# Patient Record
Sex: Female | Born: 1970 | Hispanic: Yes | Marital: Single | Smoking: Never smoker
Health system: Southern US, Community
[De-identification: ages and names within clinical notes are randomized; demographics above are authoritative.]

## PROBLEM LIST (undated history)

## (undated) DIAGNOSIS — F419 Anxiety disorder, unspecified: Secondary | ICD-10-CM

## (undated) DIAGNOSIS — F32A Depression, unspecified: Secondary | ICD-10-CM

## (undated) DIAGNOSIS — G4730 Sleep apnea, unspecified: Secondary | ICD-10-CM

## (undated) DIAGNOSIS — Z86718 Personal history of other venous thrombosis and embolism: Secondary | ICD-10-CM

## (undated) DIAGNOSIS — Z9109 Other allergy status, other than to drugs and biological substances: Secondary | ICD-10-CM

## (undated) DIAGNOSIS — O24419 Gestational diabetes mellitus in pregnancy, unspecified control: Secondary | ICD-10-CM

## (undated) HISTORY — PX: WISDOM TOOTH EXTRACTION: SHX21

## (undated) HISTORY — PX: NO PAST SURGERIES: SHX2092

## (undated) HISTORY — PX: OOPHORECTOMY: SHX86

## (undated) HISTORY — PX: ABDOMINAL HYSTERECTOMY: SHX81

---

## 2000-01-24 ENCOUNTER — Emergency Department (HOSPITAL_COMMUNITY): Admission: EM | Admit: 2000-01-24 | Discharge: 2000-01-24 | Payer: Self-pay | Admitting: Emergency Medicine

## 2000-02-01 ENCOUNTER — Emergency Department (HOSPITAL_COMMUNITY): Admission: EM | Admit: 2000-02-01 | Discharge: 2000-02-01 | Payer: Self-pay | Admitting: Emergency Medicine

## 2000-08-24 ENCOUNTER — Inpatient Hospital Stay (HOSPITAL_COMMUNITY): Admission: AD | Admit: 2000-08-24 | Discharge: 2000-08-24 | Payer: Self-pay | Admitting: *Deleted

## 2000-08-25 ENCOUNTER — Encounter: Payer: Self-pay | Admitting: *Deleted

## 2000-08-25 ENCOUNTER — Inpatient Hospital Stay (HOSPITAL_COMMUNITY): Admission: AD | Admit: 2000-08-25 | Discharge: 2000-08-25 | Payer: Self-pay | Admitting: *Deleted

## 2000-08-31 ENCOUNTER — Encounter: Payer: Self-pay | Admitting: *Deleted

## 2000-08-31 ENCOUNTER — Inpatient Hospital Stay (HOSPITAL_COMMUNITY): Admission: AD | Admit: 2000-08-31 | Discharge: 2000-08-31 | Payer: Self-pay | Admitting: *Deleted

## 2000-09-03 ENCOUNTER — Inpatient Hospital Stay (HOSPITAL_COMMUNITY): Admission: AD | Admit: 2000-09-03 | Discharge: 2000-09-03 | Payer: Self-pay | Admitting: Obstetrics & Gynecology

## 2000-09-10 ENCOUNTER — Inpatient Hospital Stay (HOSPITAL_COMMUNITY): Admission: AD | Admit: 2000-09-10 | Discharge: 2000-09-10 | Payer: Self-pay | Admitting: Obstetrics & Gynecology

## 2000-11-02 ENCOUNTER — Emergency Department (HOSPITAL_COMMUNITY): Admission: EM | Admit: 2000-11-02 | Discharge: 2000-11-03 | Payer: Self-pay | Admitting: Emergency Medicine

## 2000-11-03 ENCOUNTER — Encounter: Payer: Self-pay | Admitting: Emergency Medicine

## 2000-12-28 ENCOUNTER — Emergency Department (HOSPITAL_COMMUNITY): Admission: EM | Admit: 2000-12-28 | Discharge: 2000-12-28 | Payer: Self-pay | Admitting: Emergency Medicine

## 2001-02-21 ENCOUNTER — Emergency Department (HOSPITAL_COMMUNITY): Admission: EM | Admit: 2001-02-21 | Discharge: 2001-02-21 | Payer: Self-pay | Admitting: Emergency Medicine

## 2001-04-01 ENCOUNTER — Encounter: Payer: Self-pay | Admitting: Obstetrics & Gynecology

## 2001-04-01 ENCOUNTER — Inpatient Hospital Stay: Admission: AD | Admit: 2001-04-01 | Discharge: 2001-04-01 | Payer: Self-pay | Admitting: *Deleted

## 2001-04-03 ENCOUNTER — Inpatient Hospital Stay (HOSPITAL_COMMUNITY): Admission: AD | Admit: 2001-04-03 | Discharge: 2001-04-03 | Payer: Self-pay | Admitting: *Deleted

## 2001-04-09 ENCOUNTER — Inpatient Hospital Stay (HOSPITAL_COMMUNITY): Admission: AD | Admit: 2001-04-09 | Discharge: 2001-04-09 | Payer: Self-pay | Admitting: *Deleted

## 2001-04-09 ENCOUNTER — Encounter: Payer: Self-pay | Admitting: *Deleted

## 2001-04-19 ENCOUNTER — Encounter (INDEPENDENT_AMBULATORY_CARE_PROVIDER_SITE_OTHER): Payer: Self-pay | Admitting: Specialist

## 2001-04-19 ENCOUNTER — Ambulatory Visit (HOSPITAL_COMMUNITY): Admission: RE | Admit: 2001-04-19 | Discharge: 2001-04-19 | Payer: Self-pay | Admitting: Obstetrics & Gynecology

## 2001-04-19 ENCOUNTER — Encounter: Payer: Self-pay | Admitting: *Deleted

## 2001-04-26 ENCOUNTER — Inpatient Hospital Stay (HOSPITAL_COMMUNITY): Admission: AD | Admit: 2001-04-26 | Discharge: 2001-04-26 | Payer: Self-pay | Admitting: Obstetrics

## 2001-04-26 ENCOUNTER — Encounter: Payer: Self-pay | Admitting: Obstetrics

## 2001-05-18 ENCOUNTER — Ambulatory Visit (HOSPITAL_COMMUNITY): Admission: RE | Admit: 2001-05-18 | Discharge: 2001-05-18 | Payer: Self-pay | Admitting: *Deleted

## 2002-01-03 ENCOUNTER — Emergency Department (HOSPITAL_COMMUNITY): Admission: EM | Admit: 2002-01-03 | Discharge: 2002-01-03 | Payer: Self-pay | Admitting: Emergency Medicine

## 2002-07-26 ENCOUNTER — Encounter (HOSPITAL_COMMUNITY): Admission: RE | Admit: 2002-07-26 | Discharge: 2002-08-25 | Payer: Self-pay | Admitting: *Deleted

## 2002-07-26 ENCOUNTER — Encounter (INDEPENDENT_AMBULATORY_CARE_PROVIDER_SITE_OTHER): Payer: Self-pay | Admitting: Specialist

## 2002-08-02 ENCOUNTER — Encounter: Payer: Self-pay | Admitting: *Deleted

## 2002-08-24 ENCOUNTER — Inpatient Hospital Stay (HOSPITAL_COMMUNITY): Admission: AD | Admit: 2002-08-24 | Discharge: 2002-08-24 | Payer: Self-pay | Admitting: Obstetrics and Gynecology

## 2002-08-30 ENCOUNTER — Encounter (HOSPITAL_COMMUNITY): Admission: RE | Admit: 2002-08-30 | Discharge: 2002-09-29 | Payer: Self-pay | Admitting: *Deleted

## 2002-10-18 ENCOUNTER — Encounter (HOSPITAL_COMMUNITY): Admission: RE | Admit: 2002-10-18 | Discharge: 2002-11-17 | Payer: Self-pay | Admitting: *Deleted

## 2002-10-18 ENCOUNTER — Encounter: Payer: Self-pay | Admitting: *Deleted

## 2002-11-29 ENCOUNTER — Encounter: Admission: RE | Admit: 2002-11-29 | Discharge: 2002-12-29 | Payer: Self-pay | Admitting: *Deleted

## 2002-12-27 ENCOUNTER — Encounter: Payer: Self-pay | Admitting: *Deleted

## 2002-12-30 ENCOUNTER — Inpatient Hospital Stay (HOSPITAL_COMMUNITY): Admission: AD | Admit: 2002-12-30 | Discharge: 2003-01-03 | Payer: Self-pay | Admitting: Obstetrics and Gynecology

## 2003-01-10 ENCOUNTER — Encounter (HOSPITAL_COMMUNITY): Admission: RE | Admit: 2003-01-10 | Discharge: 2003-01-10 | Payer: Self-pay | Admitting: *Deleted

## 2003-01-17 ENCOUNTER — Encounter: Admission: RE | Admit: 2003-01-17 | Discharge: 2003-04-17 | Payer: Self-pay | Admitting: *Deleted

## 2003-01-17 ENCOUNTER — Encounter (HOSPITAL_COMMUNITY): Admission: RE | Admit: 2003-01-17 | Discharge: 2003-01-17 | Payer: Self-pay | Admitting: *Deleted

## 2003-01-25 ENCOUNTER — Encounter: Admission: RE | Admit: 2003-01-25 | Discharge: 2003-01-25 | Payer: Self-pay | Admitting: *Deleted

## 2003-01-25 ENCOUNTER — Encounter (HOSPITAL_COMMUNITY): Admission: RE | Admit: 2003-01-25 | Discharge: 2003-02-24 | Payer: Self-pay | Admitting: *Deleted

## 2003-02-01 ENCOUNTER — Encounter: Admission: RE | Admit: 2003-02-01 | Discharge: 2003-02-01 | Payer: Self-pay | Admitting: *Deleted

## 2003-02-09 ENCOUNTER — Encounter: Admission: RE | Admit: 2003-02-09 | Discharge: 2003-02-09 | Payer: Self-pay | Admitting: *Deleted

## 2003-02-16 ENCOUNTER — Encounter: Admission: RE | Admit: 2003-02-16 | Discharge: 2003-02-16 | Payer: Self-pay | Admitting: *Deleted

## 2003-02-23 ENCOUNTER — Encounter: Admission: RE | Admit: 2003-02-23 | Discharge: 2003-02-23 | Payer: Self-pay | Admitting: *Deleted

## 2003-02-27 ENCOUNTER — Encounter (HOSPITAL_COMMUNITY): Admission: RE | Admit: 2003-02-27 | Discharge: 2003-03-09 | Payer: Self-pay | Admitting: *Deleted

## 2003-03-02 ENCOUNTER — Encounter: Admission: RE | Admit: 2003-03-02 | Discharge: 2003-03-02 | Payer: Self-pay | Admitting: *Deleted

## 2003-03-09 ENCOUNTER — Encounter: Admission: RE | Admit: 2003-03-09 | Discharge: 2003-03-09 | Payer: Self-pay | Admitting: *Deleted

## 2003-03-13 ENCOUNTER — Inpatient Hospital Stay (HOSPITAL_COMMUNITY): Admission: AD | Admit: 2003-03-13 | Discharge: 2003-03-16 | Payer: Self-pay | Admitting: *Deleted

## 2003-03-14 ENCOUNTER — Encounter (INDEPENDENT_AMBULATORY_CARE_PROVIDER_SITE_OTHER): Payer: Self-pay

## 2003-03-24 ENCOUNTER — Inpatient Hospital Stay (HOSPITAL_COMMUNITY): Admission: AD | Admit: 2003-03-24 | Discharge: 2003-03-24 | Payer: Self-pay | Admitting: *Deleted

## 2003-04-25 ENCOUNTER — Other Ambulatory Visit: Admission: RE | Admit: 2003-04-25 | Discharge: 2003-04-25 | Payer: Self-pay | Admitting: Obstetrics and Gynecology

## 2003-04-25 ENCOUNTER — Encounter: Admission: RE | Admit: 2003-04-25 | Discharge: 2003-04-25 | Payer: Self-pay | Admitting: Obstetrics and Gynecology

## 2003-06-05 ENCOUNTER — Emergency Department (HOSPITAL_COMMUNITY): Admission: EM | Admit: 2003-06-05 | Discharge: 2003-06-05 | Payer: Self-pay | Admitting: Emergency Medicine

## 2005-01-03 ENCOUNTER — Inpatient Hospital Stay (HOSPITAL_COMMUNITY): Admission: AD | Admit: 2005-01-03 | Discharge: 2005-01-03 | Payer: Self-pay | Admitting: Obstetrics & Gynecology

## 2005-01-08 ENCOUNTER — Inpatient Hospital Stay (HOSPITAL_COMMUNITY): Admission: AD | Admit: 2005-01-08 | Discharge: 2005-01-08 | Payer: Self-pay | Admitting: Obstetrics and Gynecology

## 2005-01-16 ENCOUNTER — Ambulatory Visit: Payer: Self-pay | Admitting: Family Medicine

## 2005-01-24 ENCOUNTER — Ambulatory Visit (HOSPITAL_COMMUNITY): Admission: RE | Admit: 2005-01-24 | Discharge: 2005-01-24 | Payer: Self-pay | Admitting: *Deleted

## 2005-01-27 ENCOUNTER — Inpatient Hospital Stay (HOSPITAL_COMMUNITY): Admission: AD | Admit: 2005-01-27 | Discharge: 2005-01-30 | Payer: Self-pay | Admitting: *Deleted

## 2005-01-27 ENCOUNTER — Ambulatory Visit: Payer: Self-pay | Admitting: Family Medicine

## 2005-02-05 ENCOUNTER — Ambulatory Visit: Payer: Self-pay | Admitting: *Deleted

## 2005-02-12 ENCOUNTER — Ambulatory Visit: Payer: Self-pay | Admitting: *Deleted

## 2005-02-26 ENCOUNTER — Ambulatory Visit: Payer: Self-pay | Admitting: Family Medicine

## 2005-02-26 ENCOUNTER — Ambulatory Visit: Payer: Self-pay | Admitting: *Deleted

## 2005-02-26 ENCOUNTER — Inpatient Hospital Stay (HOSPITAL_COMMUNITY): Admission: AD | Admit: 2005-02-26 | Discharge: 2005-02-28 | Payer: Self-pay | Admitting: Obstetrics and Gynecology

## 2005-03-05 ENCOUNTER — Ambulatory Visit: Payer: Self-pay | Admitting: *Deleted

## 2005-03-19 ENCOUNTER — Ambulatory Visit: Payer: Self-pay | Admitting: *Deleted

## 2005-03-19 ENCOUNTER — Encounter (INDEPENDENT_AMBULATORY_CARE_PROVIDER_SITE_OTHER): Payer: Self-pay | Admitting: *Deleted

## 2005-04-02 ENCOUNTER — Ambulatory Visit: Payer: Self-pay | Admitting: *Deleted

## 2005-04-09 ENCOUNTER — Ambulatory Visit: Payer: Self-pay | Admitting: *Deleted

## 2005-04-15 ENCOUNTER — Ambulatory Visit: Payer: Self-pay | Admitting: *Deleted

## 2005-04-15 ENCOUNTER — Ambulatory Visit (HOSPITAL_COMMUNITY): Admission: RE | Admit: 2005-04-15 | Discharge: 2005-04-15 | Payer: Self-pay | Admitting: Obstetrics and Gynecology

## 2005-04-30 ENCOUNTER — Ambulatory Visit: Payer: Self-pay | Admitting: *Deleted

## 2005-05-14 ENCOUNTER — Ambulatory Visit: Payer: Self-pay | Admitting: *Deleted

## 2005-05-28 ENCOUNTER — Ambulatory Visit: Payer: Self-pay | Admitting: *Deleted

## 2005-06-11 ENCOUNTER — Ambulatory Visit: Payer: Self-pay | Admitting: *Deleted

## 2005-07-02 ENCOUNTER — Ambulatory Visit (HOSPITAL_COMMUNITY): Admission: RE | Admit: 2005-07-02 | Discharge: 2005-07-02 | Payer: Self-pay | Admitting: Obstetrics and Gynecology

## 2005-07-02 ENCOUNTER — Ambulatory Visit: Payer: Self-pay | Admitting: *Deleted

## 2005-07-16 ENCOUNTER — Ambulatory Visit: Payer: Self-pay | Admitting: *Deleted

## 2005-07-30 ENCOUNTER — Ambulatory Visit: Payer: Self-pay | Admitting: *Deleted

## 2005-08-06 ENCOUNTER — Ambulatory Visit: Payer: Self-pay | Admitting: Obstetrics & Gynecology

## 2005-08-13 ENCOUNTER — Ambulatory Visit: Payer: Self-pay | Admitting: Obstetrics & Gynecology

## 2005-08-20 ENCOUNTER — Ambulatory Visit: Payer: Self-pay | Admitting: *Deleted

## 2005-08-20 ENCOUNTER — Inpatient Hospital Stay (HOSPITAL_COMMUNITY): Admission: AD | Admit: 2005-08-20 | Discharge: 2005-08-20 | Payer: Self-pay | Admitting: Obstetrics and Gynecology

## 2005-08-22 ENCOUNTER — Ambulatory Visit: Payer: Self-pay | Admitting: *Deleted

## 2005-08-27 ENCOUNTER — Ambulatory Visit: Payer: Self-pay | Admitting: Obstetrics & Gynecology

## 2005-09-03 ENCOUNTER — Ambulatory Visit: Payer: Self-pay | Admitting: Obstetrics and Gynecology

## 2005-09-04 ENCOUNTER — Ambulatory Visit: Payer: Self-pay | Admitting: *Deleted

## 2005-09-04 ENCOUNTER — Inpatient Hospital Stay (HOSPITAL_COMMUNITY): Admission: RE | Admit: 2005-09-04 | Discharge: 2005-09-07 | Payer: Self-pay | Admitting: *Deleted

## 2005-09-04 ENCOUNTER — Encounter (INDEPENDENT_AMBULATORY_CARE_PROVIDER_SITE_OTHER): Payer: Self-pay | Admitting: *Deleted

## 2005-09-11 ENCOUNTER — Ambulatory Visit: Payer: Self-pay | Admitting: Obstetrics & Gynecology

## 2006-05-08 ENCOUNTER — Ambulatory Visit: Payer: Self-pay | Admitting: Internal Medicine

## 2006-06-19 ENCOUNTER — Ambulatory Visit: Payer: Self-pay | Admitting: Surgery

## 2006-07-02 ENCOUNTER — Ambulatory Visit: Payer: Self-pay | Admitting: Gynecology

## 2006-08-10 ENCOUNTER — Emergency Department: Payer: Self-pay | Admitting: Emergency Medicine

## 2006-08-14 ENCOUNTER — Encounter (INDEPENDENT_AMBULATORY_CARE_PROVIDER_SITE_OTHER): Payer: Self-pay | Admitting: Specialist

## 2006-08-14 ENCOUNTER — Ambulatory Visit: Payer: Self-pay | Admitting: Gynecology

## 2006-08-14 ENCOUNTER — Ambulatory Visit (HOSPITAL_COMMUNITY): Admission: RE | Admit: 2006-08-14 | Discharge: 2006-08-14 | Payer: Self-pay | Admitting: Gynecology

## 2006-08-18 IMAGING — CT CT STONE STUDY
1 of 2 series · 15 of 32 positions shown, 19 images · non-contrast
Comparison: none

REASON FOR EXAM: Hematuria, evaluate for kidney stones
COMMENTS:

PROCEDURE:     CT  - CT ABDOMEN /PELVIS WO (STONE)  - May 08, 2006  [DATE]
RESULT:
HISTORY: Hematuria.
COMPARISON STUDIES:  None.

[Series 2: soft tissue · axial · 0.76mm/px · z∈[-1276,-900]mm · 15 of 141 slices shown, 19 images]
[im 11/141  soft-tissue]
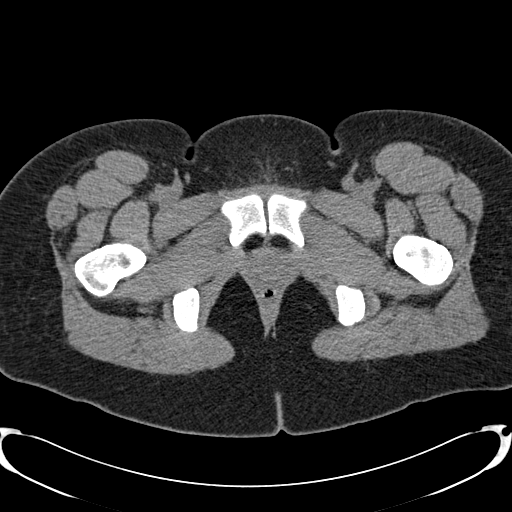
[im 11/141  bone]
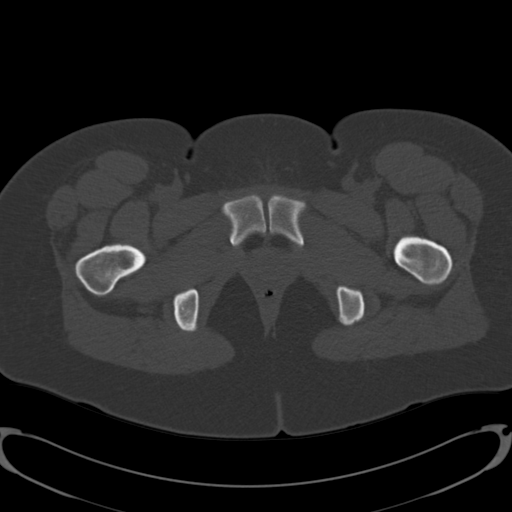
[im 21/141  soft-tissue]
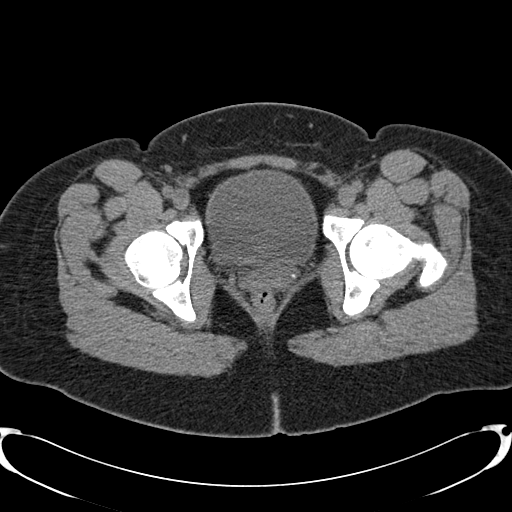
[im 31/141  soft-tissue]
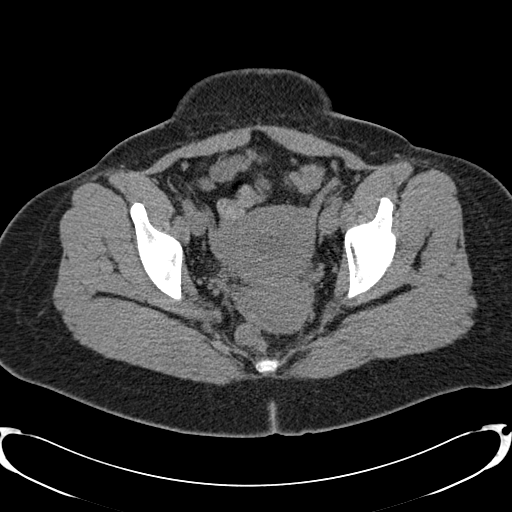
[im 41/141  soft-tissue]
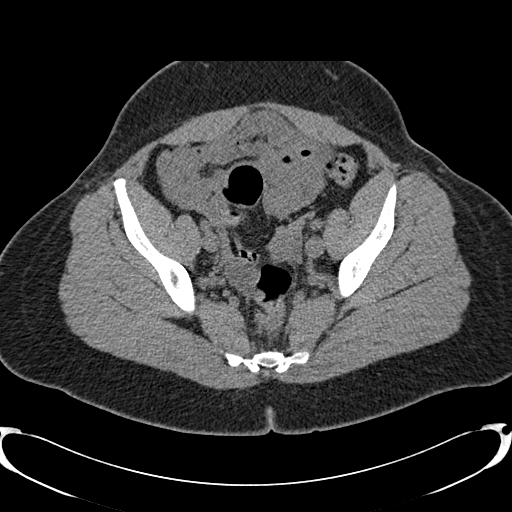
[im 51/141  soft-tissue]
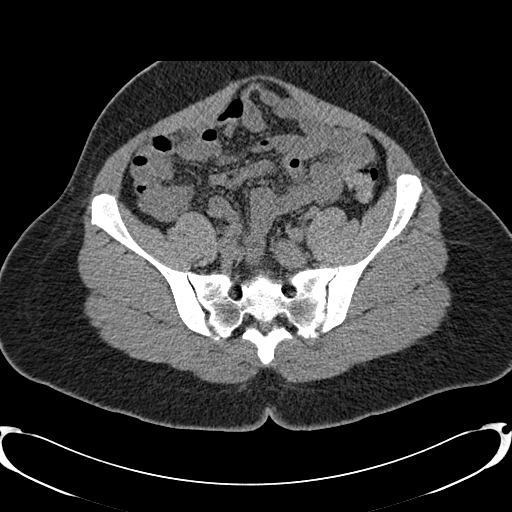
[im 61/141  soft-tissue]
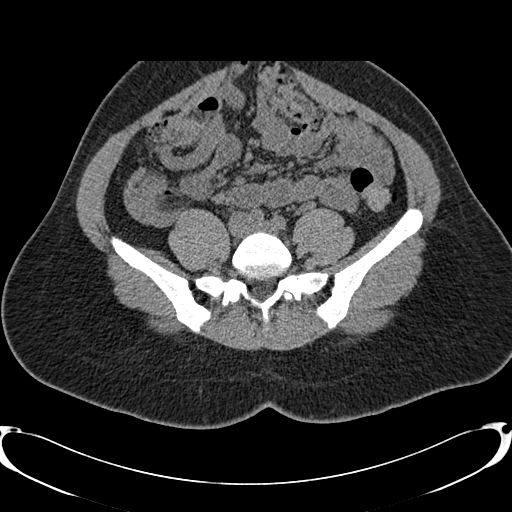
[im 71/141  soft-tissue]
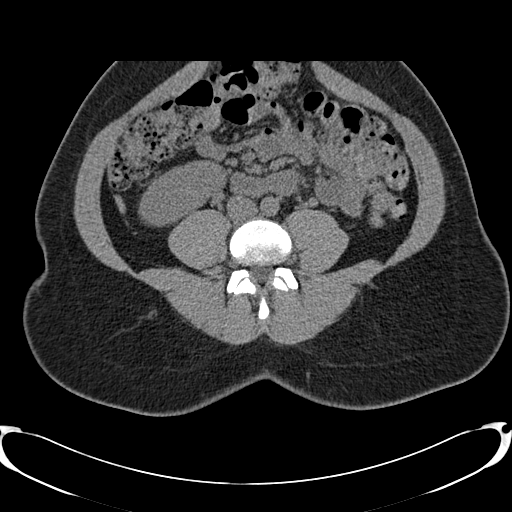
[im 81/141  soft-tissue]
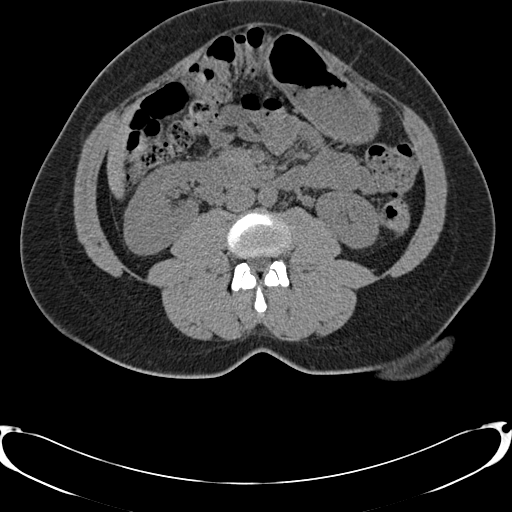
[im 91/141  soft-tissue]
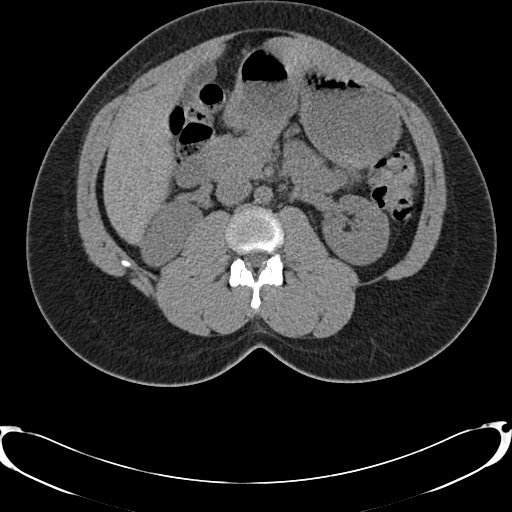
[im 91/141  bone]
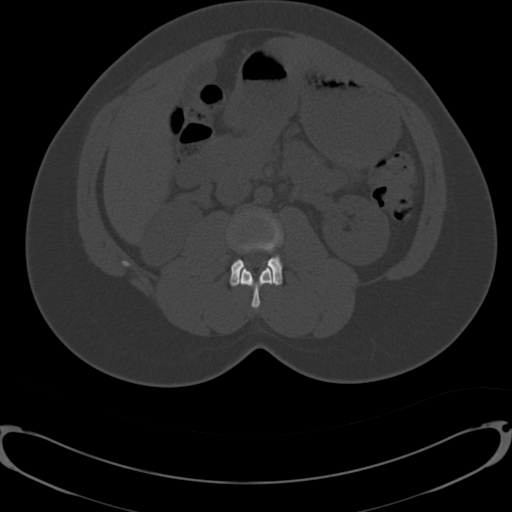
[im 101/141  soft-tissue]
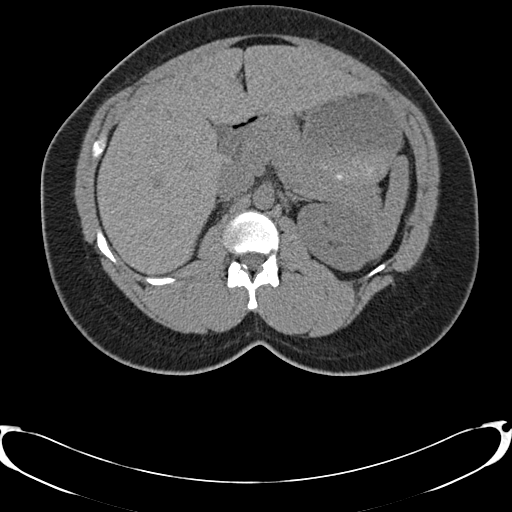
[im 111/141  soft-tissue]
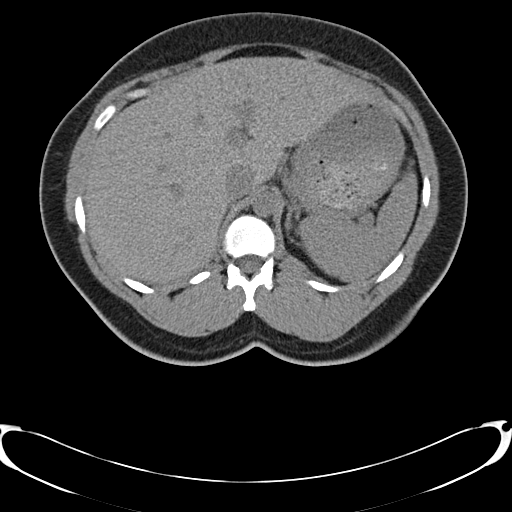
[im 121/141  soft-tissue]
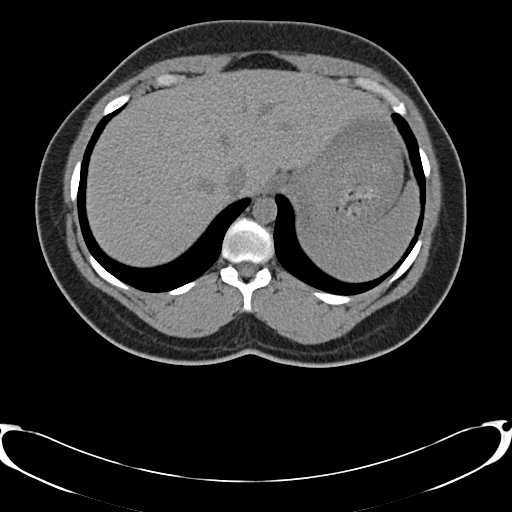
[im 121/141  lung]
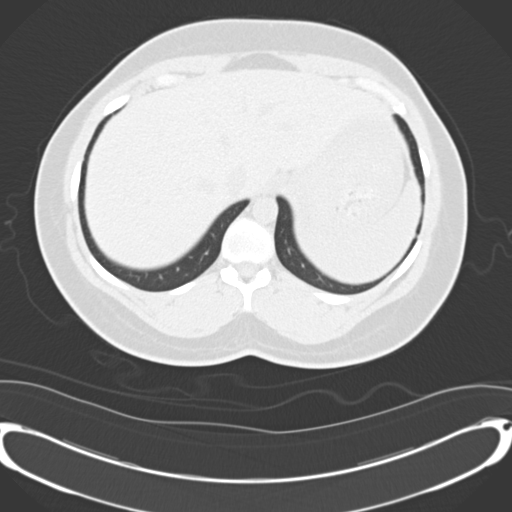
[im 126/141  lung]
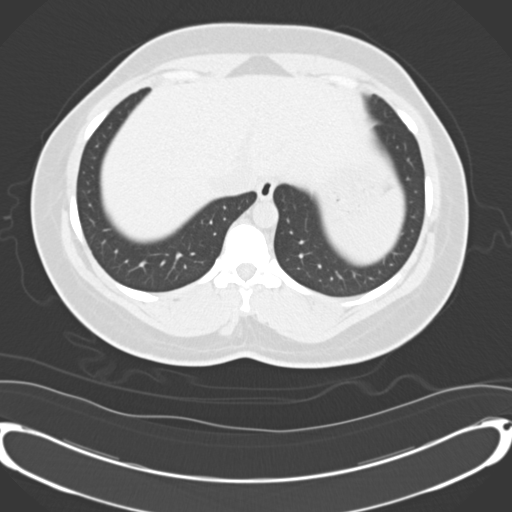
[im 131/141  soft-tissue]
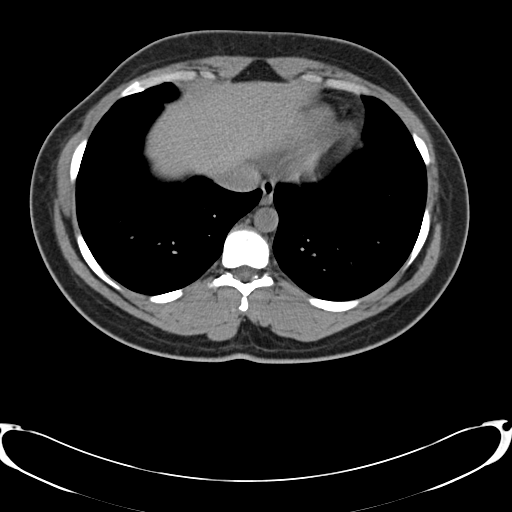
[im 131/141  lung]
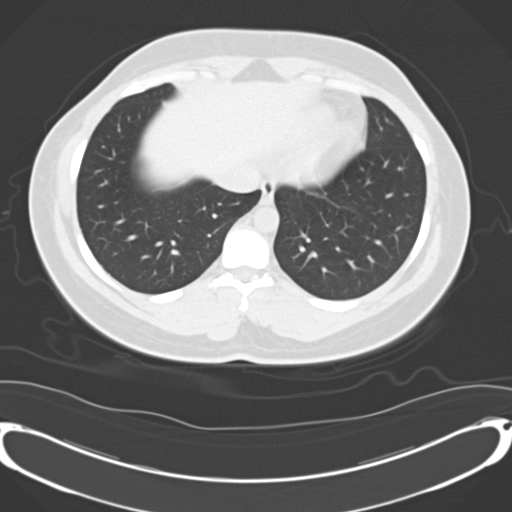
[im 136/141  lung]
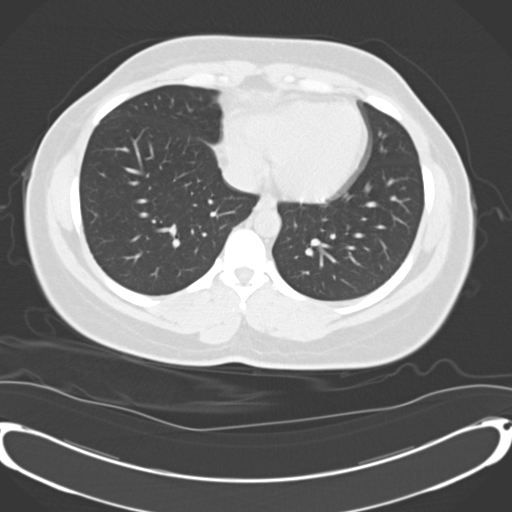

[15 of 32 positions shown; findings below may reference images not displayed]

FINDINGS: Standard nonenhanced CT of the abdomen was obtained.  The liver
and spleen are normal.  The adrenals are normal.  The pancreas is normal.
No bowel distention is noted.  No focal renal abnormalities are identified.
Multiple calcifications are noted in the pelvis most consistent with
phleboliths.  Distal ureteral stones cannot be excluded due to the presence
of phleboliths.  There is no evidence of high-grade hydronephrosis.  If
further evaluation for stone disease is needed we can perform a
contrast-enhanced study.  There is an umbilical hernia without evidence of
bowel obstruction.  No inguinal adenopathy is noted.  Lung bases are clear.
IMPRESSION: Multiple pelvic calcifications consistent with phleboliths.  Distal ureteral
stone cannot be entirely excluded.  There is no evidence of high-grade
hydronephrosis.  If further evaluation is needed to evaluate for stone
disease, contrast-enhanced study can be obtained.

Umbilical hernia, no evidence of bowel obstruction.

## 2006-08-28 ENCOUNTER — Ambulatory Visit: Payer: Self-pay | Admitting: Gynecology

## 2006-09-06 ENCOUNTER — Emergency Department: Payer: Self-pay | Admitting: Internal Medicine

## 2006-09-06 ENCOUNTER — Other Ambulatory Visit: Payer: Self-pay

## 2006-10-27 ENCOUNTER — Ambulatory Visit: Payer: Self-pay | Admitting: Obstetrics & Gynecology

## 2006-10-27 ENCOUNTER — Encounter (INDEPENDENT_AMBULATORY_CARE_PROVIDER_SITE_OTHER): Payer: Self-pay | Admitting: Specialist

## 2006-10-27 ENCOUNTER — Inpatient Hospital Stay (HOSPITAL_COMMUNITY): Admission: AD | Admit: 2006-10-27 | Discharge: 2006-10-29 | Payer: Self-pay | Admitting: Gynecology

## 2006-11-24 ENCOUNTER — Inpatient Hospital Stay (HOSPITAL_COMMUNITY): Admission: AD | Admit: 2006-11-24 | Discharge: 2006-11-24 | Payer: Self-pay | Admitting: Obstetrics and Gynecology

## 2006-12-04 ENCOUNTER — Ambulatory Visit: Payer: Self-pay | Admitting: Podiatry

## 2006-12-18 ENCOUNTER — Ambulatory Visit: Payer: Self-pay | Admitting: Gynecology

## 2007-01-07 ENCOUNTER — Ambulatory Visit: Payer: Self-pay | Admitting: Gynecology

## 2007-07-01 ENCOUNTER — Ambulatory Visit: Payer: Self-pay | Admitting: Internal Medicine

## 2009-07-29 ENCOUNTER — Ambulatory Visit: Payer: Self-pay | Admitting: Oncology

## 2009-08-02 ENCOUNTER — Ambulatory Visit: Payer: Self-pay | Admitting: Internal Medicine

## 2009-08-08 ENCOUNTER — Ambulatory Visit: Payer: Self-pay | Admitting: Oncology

## 2009-08-09 ENCOUNTER — Ambulatory Visit: Payer: Self-pay | Admitting: Internal Medicine

## 2009-08-29 ENCOUNTER — Ambulatory Visit: Payer: Self-pay | Admitting: Oncology

## 2010-02-19 ENCOUNTER — Emergency Department: Payer: Self-pay | Admitting: Unknown Physician Specialty

## 2010-03-18 ENCOUNTER — Emergency Department (HOSPITAL_COMMUNITY): Admission: EM | Admit: 2010-03-18 | Discharge: 2010-03-19 | Payer: Self-pay | Admitting: Emergency Medicine

## 2011-01-18 ENCOUNTER — Encounter: Payer: Self-pay | Admitting: *Deleted

## 2011-04-27 ENCOUNTER — Emergency Department (HOSPITAL_COMMUNITY)
Admission: EM | Admit: 2011-04-27 | Discharge: 2011-04-27 | Disposition: A | Discharge status: Home / Self Care | Payer: Medicaid Other | Attending: Emergency Medicine | Admitting: Emergency Medicine

## 2011-04-27 ENCOUNTER — Emergency Department (HOSPITAL_COMMUNITY): Payer: Medicaid Other

## 2011-04-27 DIAGNOSIS — R509 Fever, unspecified: Secondary | ICD-10-CM | POA: Insufficient documentation

## 2011-04-27 DIAGNOSIS — R059 Cough, unspecified: Secondary | ICD-10-CM | POA: Insufficient documentation

## 2011-04-27 DIAGNOSIS — R05 Cough: Secondary | ICD-10-CM | POA: Insufficient documentation

## 2011-04-27 DIAGNOSIS — J029 Acute pharyngitis, unspecified: Secondary | ICD-10-CM | POA: Insufficient documentation

## 2011-05-16 NOTE — Discharge Summary (Signed)
Crystal Gibson, Crystal Gibson                ACCOUNT NO.:  [id number]   MEDICAL RECORD NO.:  [id number]          PATIENT TYPE:  INP   LOCATION:  9310                          [facility]   PHYSICIAN:  Lesly Dukes, M.D. DATE OF BIRTH:  [birth year]   DATE OF ADMISSION:  01/27/2005  DATE OF DISCHARGE:                                 DISCHARGE SUMMARY   DISCHARGE DIAGNOSES:  1.  Viral syndrome, likely influenza.  2.  Intrauterine pregnancy at 9 weeks 0 days.  3.  Gestational diabetes, diet controlled.   DISCHARGE MEDICATIONS:  Only Tylenol 500 mg q.4h. as needed.   DISPOSITION:  The patient discharged to home.   CONSULTS:  None.   PROCEDURES:  Chest x-ray showed no active disease, lungs clear.   BRIEF ADMISSION HISTORY:  This is a 40 year old African-American female G7  P2-3-1-1 who presented at 8 weeks 4 days complaining of fever, malaise, and  dizziness x2 days.  The patient's temperature on admission was 102.7, pulse  of 106, respiratory rate of 20, and blood pressure of 108/62.  Urinalysis  showed 500 glucose, otherwise negative.  White blood cell count was 5.5,  hemoglobin 11.6, with 70% neutrophils.   HOSPITAL COURSE:  #1 - VIRAL SYNDROME.  Again, likely influenza considering  myalgias, nausea, and fever.  Blood cultures were drawn which were no  growth.  During hospitalization, the patient has slowly improved; however,  not back as baseline, still having some myalgias and fatigue, but was  tolerating p.o. and felt to be stable for discharge.   #2 - NAUSEA WITH MILD DEHYDRATION.  On admission, the patient received IV  hydration and again has been tolerating p.o. at least 24 hours prior to  discharge.  No complications during hospitalization.   #3 - INTRAUTERINE PREGNANCY, 9 WEEKS 0 DAYS ON THE DAY OF DISCHARGE.  No  vaginal bleeding, no new discharge, no contractions or cramping.  The  patient will [facility] on February 05, 2005 at 10  a.m.      AD/MEDQ  D:  01/30/2005  T:  01/30/2005  Job:  [id number]   cc:   [facility]

## 2011-05-16 NOTE — Op Note (Signed)
NAMEIVANNAH, ZODY                ACCOUNT NO.:  [id number]   MEDICAL RECORD NO.:  [id number]          PATIENT TYPE:  INP   LOCATION:  9318                          [facility]ILITY[facility]   PHYSICIAN:  Ginger Carne, MD  DATE OF BIRTH:  April 15, 1971   DATE OF PROCEDURE:  10/27/2006  DATE OF DISCHARGE:                                 OPERATIVE REPORT   ADDENDUM:  After closure of the vaginal cuff, reinspection by laparoscopy  demonstrated evidence of  suture material which incorporated the anterior  wall of the rectosigmoid colon.  The closure was taken down completely,  sutures carefully released without injury to the rectosigmoid colon and  without tearing of the serosa.  Digital examination of the rectosigmoid in  that region revealed no violation.  Reclosure of the vaginal cuff with 0  Vicryl interlocking suture followed.  This was then followed by laparoscopic  evaluation which demonstrated no placement of vaginal cuff sutures near the  rectosigmoid colon.  No active bleeding in the pelvis including the cuff or  pedicles was noted.  After, digital rectal examination was performed while  laparoscoping the patient and there was no evidence for suture material in  the rectosigmoid colon or on its surface.  Please refer to the remainder of  the operative dictation.      Ginger Carne, MD  Electronically Signed     SHB/MEDQ  D:  10/28/2006  T:  10/29/2006  Job:  [id number]

## 2011-05-16 NOTE — Op Note (Signed)
Crystal Gibson, Crystal Gibson                ACCOUNT NO.:  [id number]   MEDICAL RECORD NO.:  [id number]          PATIENT TYPE:  AMB   LOCATION:  SDC                           [facility]   PHYSICIAN:  Ginger Carne, MD  DATE OF BIRTH:  04-May-1971   DATE OF PROCEDURE:  08/14/2006  DATE OF DISCHARGE:                                 OPERATIVE REPORT   PREOPERATIVE DIAGNOSIS:  Chronic pelvic pain and abnormal uterine bleeding.   POSTOPERATIVE DIAGNOSIS:  Endometriosis of pelvis and a normal intrauterine  cavity.   OPERATIVE PROCEDURE:  Diagnostic laparoscopy, hysteroscopy with curettage.   SURGEON:  Blima Rich, M.D.   ASSISTANT:  None.   COMPLICATIONS:  None immediate.   ESTIMATED BLOOD LOSS:  Minimal.   SPECIMEN:  Endometrial curettings to pathology.   ANESTHESIA:  General.   OPERATIVE FINDINGS:  External genitalia, vulva and vagina normal.  Cervix  smooth without erosions or lesions.  Hysteroscopic evaluation revealed a  normal intra-cervical cavity, following through the endocervix, the  endometrial cavity revealed no evidence of abnormalities including polyps or  fibroids.  Tissue was hyperplastic but no evidence for carcinoma.  Both  ostia noted as well as all walls.  Following this, attention was directed  towards curettage and specimens then sent to pathology.   Laparoscopic evaluation performed by making an incision at Palmar's point,  Veress needle placed in the abdomen, opening and closing pressures were 10-  15 mmHg.  Afterwards, trocar placed in same incision, laparoscope placed in  trocar sleeve.  Inspection of the upper abdominal and pelvic contents  followed with the aid of a 5-mm port incision made under direct  visualization.  Photography taken evidence of endometriosis along the broad  ligaments, uterosacral ligaments was noted.  This also included surface  areas of the ovaries.  Appendix was visualized and found to be normal.  Uterus, tubes and ovaries  had normal contour.  No evidence of femoral,  inguinal or obturator hernias.  Gallbladder visualized.  Liver visualized.  No evidence of Fitz-Hugh Curtis syndrome or adhesive disease.  Large and small bowel grossly normal.  Afterwards gas released, trocars  removed.  Closure of 10-mm fascia site with 0 Vicryl suture and 4-0 Vicryl  for subcuticular closure.  Instrument and sponge count were correct.  The  patient tolerated the procedure well and returned to the post-anesthesia  recovery room in excellent condition.      Ginger Carne, MD  Electronically Signed     SHB/MEDQ  D:  08/14/2006  T:  08/14/2006  Job:  [id number]

## 2011-05-16 NOTE — Discharge Summary (Signed)
NAMEQUINTINA, HAKEEM                ACCOUNT NO.:  [id number]   MEDICAL RECORD NO.:  [id number]          PATIENT TYPE:  INP   LOCATION:  9309                          [facility]   PHYSICIAN:  Phil D. Okey Dupre, M.D.     DATE OF BIRTH:  07/05/1971   DATE OF ADMISSION:  02/26/2005  DATE OF DISCHARGE:  02/28/2005                                 DISCHARGE SUMMARY   ADMISSION DIAGNOSES:  23.  40 year old gravida 5, para 1-1-2-1, at 12-1/7 weeks with low grade      fevers of 99 to 100 degrees for a month.  2.  Antiphospholipid syndrome with positive anticardiolipin antibody IgG and      positive ANA.  3.  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   DISCHARGE DIAGNOSES:  85.  40 year old gravida 5, para 1-1-2-1, at 12-3/7 weeks with low grade      fevers of 99 to 100 degrees for a month.  2.  Antiphospholipid syndrome with positive anticardiolipin antibody IgG and      positive ANA.  3.  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  4.  No documented fevers while in the hospital.   DISCHARGE MEDICATIONS:  1.  Prenatal vitamins one p.o. daily.  2.  Folate 1 mg p.o. daily.  3.  Aspirin 81 mg one p.o. daily.  4.  Glyburide 2.5 mg p.o. q.h.s.   HISTORY OF PRESENT ILLNESS:  Ms. Bojanowski [facility]  complaining of a month-long history of low grade fevers from 99 to 100  degrees.  She had headache with dizziness and left flank pain.  She denied  any dysuria, hematuria.   HOSPITAL COURSE:  The patient had no documented fevers.  Her highest  temperature was 99.5.  A host of autoimmune labs were drawn all of which  were negative including anti-Rowe and LA antibodies, C4 and C5, and  double-  stranded DNA.  A urine culture grew no growth.  Blood cultures x2 showed no  growth.  Her specific compliment level was C3 133, C4 29, IgG 1600, IgM 246,  24-hour urine protein 266, and her anti double stranded DNA was negative.   The patient was reassured that she does not have lupus, but does need to  continue a baby aspirin.  She will follow up with [facility] in one  week.   CONDITION ON DISCHARGE:  The patient is discharged to home in stable  condition.   As far as her diabetes mellitus, her sugars were very well controlled with  fastings in the 70's to 80's.  Her two-hour postprandials were always less  than 100.  She likely is not completely compliant with her  diet at home  causing her fasting sugars to be in the 100's.      LC/MEDQ  D:  02/28/2005  T:  02/28/2005  Job:  [id number]   cc:   [facility]

## 2011-05-16 NOTE — Op Note (Signed)
NAMESHELVA, Crystal Gibson                ACCOUNT NO.:  [id number]   MEDICAL RECORD NO.:  [id number]           PATIENT TYPE:   LOCATION:                                 FACILITY:   PHYSICIAN:  Conni Elliot, M.D.     DATE OF BIRTH:   DATE OF PROCEDURE:  DATE OF DISCHARGE:                                 OPERATIVE REPORT   PREOPERATIVE DIAGNOSIS:  History of prior shoulder dystocia with  complications.   POSTOPERATIVE DIAGNOSIS:  History of prior shoulder dystocia with  complications.   OPERATION:  Low transverse cesarean delivery.   SURGEONS:  Conni Elliot, M.D. and Lesly Dukes, M.D.   ANESTHESIA:  Spinal anesthesia.   OPERATIVE FINDINGS:  Female infant with Apgars of 9 and 9.  Placenta was  sent to pathology.  Cord gas was sent, pH was 7.27.   PROCEDURE:  Placed the patient under spinal anesthetic, the patient was  supine in left __________  position, receiving oxygen and was prepped and  draped in sterile fashion.  A low transverse Pfannenstiel incision was made,  and entry made to the fascia.  The rectus muscles were separated in the  midline __________  abdomen, bladder flap created.  A low transverse uterine  incision was made, extended with bandage scissors.  The baby was delivered  vertex presentation, cord double clamped and cut, and handed to  neonatologist in attendance.  There was no difficulty in delivery.  The  placenta was delivered spontaneously, the uterus was closed in one layer of  closure.  The anterior peritoneal fascia was subsequently closed in routine  fashion.  Estimated blood loss was __________  without replacement and  needle and sponge correct.           ______________________________  Conni Elliot, M.D.     ASG/MEDQ  D:  09/04/2005  T:  09/04/2005  Job:  [id number]

## 2011-05-16 NOTE — Group Therapy Note (Signed)
NAMEHOLLIS, [place]                ACCOUNT NO.:  [id number]   MEDICAL RECORD NO.:  [id number]          PATIENT TYPE:  WOC   LOCATION:  [facility]                   FACILITY:  [facility]   PHYSICIAN:  Ginger Carne, MD DATE OF BIRTH:  1971-07-14   DATE OF SERVICE:  07/02/2006                                    CLINIC NOTE   HISTORY OF PRESENT ILLNESS:  This patient is a 40 year old multiparous  female who is well known to this clinic who has had persistent issues  related to bleeding for the past 2-3 months persistently and menses every 14  days over the past year since her C section in September of 2006.  On June 19, 2006, the patient had an umbilical hernia repair performed by Dr. Kemper Durie in [place].  The patient also complained of some persistent  cramping between and during her menses.  She had been on Depo-Provera for  the first 6 months following her delivery, but has had persistent bleeding,  and no improvement in pain, and subsequently has been off said medication  over the past 6 months.   PHYSICAL EXAMINATION:  GENITOURINARY:  External genitalia, vulvae and vagina  normal.  Cervix noted without erosions or lesions.  Uterus is tender; normal  size. Both adnexa palpable, and found to be normal.   IMPRESSION:  Chronic pelvic pain with abnormal uterine bleeding.   PLAN:  A GC/chlamydia culture was obtained.  At this time, the patient's  best option, before proceeding with any hormonal management, is to perform  an operative laparoscopy and hysteroscopy to determine the etiology of said  pain and abnormal bleeding.  Clearly, if the patient demonstrates evidence  for intracavity lesions and/or endometriosis/pelvic inflammatory disease,  appropriate management will follow.  Pelvic sonogram will also be ordered.           ______________________________  Ginger Carne, MD     SHB/MEDQ  D:  07/02/2006  T:  07/02/2006  Job:  [id number]

## 2011-05-16 NOTE — Discharge Summary (Signed)
NAMENIRALYA, Crystal Gibson                ACCOUNT NO.:  [id number]   MEDICAL RECORD NO.:  [id number]          PATIENT TYPE:  INP   LOCATION:  9318                          [facility]   PHYSICIAN:  Ginger Carne, MD  DATE OF BIRTH:  04-13-1971   DATE OF ADMISSION:  10/27/2006  DATE OF DISCHARGE:                                 DISCHARGE SUMMARY   REASON FOR HOSPITALIZATION:  Chronic pelvic pain, stage 2 endometriosis.   IN-HOSPITAL PROCEDURES:  Laparoscopic-assistant vaginal hysterectomy,  bilateral salpingo-oophorectomy, appendectomy.   FINAL DIAGNOSES:  Chronic pelvic pain, stage 2 endometriosis.   HOSPITAL COURSE:  This is a 40 year old African-American female who  underwent the aforementioned procedures on October 27, 2006. The patient's  postoperative course was uneventful. She was afebrile, voided well.  Incisions dry. Abdomen soft. Calves without tenderness, and lungs were  clear. Postoperative hemoglobin was 10.4, hematocrit 29.7.   The patient was discharged with routine postoperative instructions including  contacting the office for temperature elevation above 100.4 degrees  Fahrenheit, increasing abdominal pain, incisional drainage, vaginal bleeding  or increased drainage, gastrointestinal or genitourinary complaints, and/or  constipation. The patient was prescribed Estradiol 1 mg 1 twice a day for  estrogen replacement therapy and Dilaudid 2 mg 1 every 4 to 6 hours as  needed for postoperative pain. She will be seen back in the GYN clinic in 4  weeks.      Ginger Carne, MD  Electronically Signed     SHB/MEDQ  D:  10/29/2006  T:  10/29/2006  Job:  [id number]

## 2011-05-16 NOTE — Discharge Summary (Signed)
NAMEKACEY, DYSERT                ACCOUNT NO.:  [id number]   MEDICAL RECORD NO.:  [id number]          PATIENT TYPE:  INP   LOCATION:                                [facility]   PHYSICIAN:  Conni Elliot, M.D.DATE OF BIRTH:  10-19-1971   DATE OF ADMISSION:  09/03/2005  DATE OF DISCHARGE:  09/07/2005                                 DISCHARGE SUMMARY   ADMISSION DIAGNOSIS:  Intrauterine pregnancy at 38 weeks, admitted for  scheduled cesarean section.   DISCHARGE MEDICATIONS:  Percocet, prenatal vitamins, iron sulfate, 325 mg.   HISTORY OF PRESENT ILLNESS:  The patient is a 40 year old G6, para 3-1-2-1,  at 48 weeks who presented for scheduled cesarean section due to history of  shoulder dystocia with prior child.  The patient has non-insulin-dependent  diabetes mellitus and is on glyburide.   HOSPITAL COURSE:  The patient underwent a cesarean section on September 04, 2005 which resulted in a viable 8 pound 7 ounce female with Apgars of 9 at  one minute and 9 at five minutes.  Postoperative course for the patient was  uncomplicated.  The patient did go to the NICU due to feeding problems.  In  regards to the patient's diabetes, at the time of discharge her fasting  sugar was 108.  Blood type B positive, rubella immune.  Postoperative  hemoglobin was 9.7.  The patient plans to have an IUD inserted six weeks  postpartum.  The patient was given a depo shot prior to discharge and  staples were removed prior to discharge.   CONDITION ON DISCHARGE:  Stable.   DISCHARGE INSTRUCTIONS:  1.  The patient was to follow up with her primary care physician. In regards      to her diabetes, the patient was instructed to monitor her sugars as she      was going prior to pregnancy.  2.  The patient is to follow up at [facility] in six weeks.  The      patient was advised to avoid heavy lifting.  3.  The patient to take medications as instructed.  These medications are      Percocet,  prenatal vitamins and iron sulfate 325 mg.      Benn Moulder, M.D.    ______________________________  Conni Elliot, M.D.    MR/MEDQ  D:  09/07/2005  T:  09/08/2005  Job:  [id number]

## 2011-05-16 NOTE — Discharge Summary (Signed)
   Crystal Gibson, Crystal Gibson                          ACCOUNT NO.:  [id number]   MEDICAL RECORD NO.:  [id number]                   PATIENT TYPE:  INP   LOCATION:  9136                                 [facility]   PHYSICIAN:  Phil D. Okey Dupre, M.D.                  DATE OF BIRTH:  23-Oct-1971   DATE OF ADMISSION:  12/30/2002  DATE OF DISCHARGE:  01/03/2003                                 DISCHARGE SUMMARY   DISCHARGE DIAGNOSES:  1. Intrauterine pregnancy at 29 weeks and 1 day.  2. Group B Strep positive.  3. Diarrhea, resolved.  4. Hypokalemia.   DISCHARGE MEDICATIONS:  Prenatal vitamins with iron daily.   DISPOSITION AND FOLLOWUP:  The patient discharged to home and instructed to  follow up with Conni Elliot, M.D. on Tuesday, January 10, 2003 at  already scheduled appointment.   HOSPITAL COURSE:  This 40 year old G5, P1-1-2-0 presented to the MAU at  [facility] after being called in for a positive group B Strep test.  She was admitted for IV antibiotics secondary to a history of a 28-week  stillborn as well as 40-week delivery of anencephalic.  The patient was  started on Unasyn and that was discontinued after 48 hours secondary to  developing diarrhea.  The patient was given lactated Ringer's at 125 cubic  centimeters/hour for the next three days and that was decreased to 75 cubic  centimeters/hour as patient's diarrhea started improving.  Laboratory  results showed negative stool cultures as well as negative Rotavirus,  negative C. difficile, and negative Giardia.  Her potassium went down to  3.1.  She was replaced with 20 mEq of K-Dur and it went up to 3.2.  She was  given an additional 20 mEq of K-Dur on day of discharge.  The patient's  hemoglobin was noted to be 9.9 and on day of discharge she was given a  prescription for prenatal vitamins with iron and will follow up with Conni Elliot, M.D. in one week.     Billey Gosling, M.D.                       Phil D.  Okey Dupre, M.D.    AS/MEDQ  D:  01/03/2003  T:  01/03/2003  Job:  [id number]

## 2011-05-16 NOTE — Group Therapy Note (Signed)
NAMEMEMORI, SAMMON                ACCOUNT NO.:  [id number]   MEDICAL RECORD NO.:  [id number]          PATIENT TYPE:  WOC   [facility]ATION:  [facility]                   [facility]   PHYSICIAN:  Ginger Carne, MD DATE OF BIRTH:  Dec 21, 1971   DATE OF SERVICE:  12/18/2006                                  CLINIC NOTE   The patient returns today for postop evaluation.  She underwent a  laparoscopic-assisted vaginal hysterectomy and bilateral salpingo-  oophorectomy on October 27, 2006.  The patient has done well since.  She  has no genitourinary or gastrointestinal symptomatology.  She takes  estradiol 1 mg twice daily.  She denies vasomotor symptomatology.  At  this point she is coping with issues of depression and free-floating  anxiety and has a psychiatric appointment in 1 week.  I doubt very much  that of any of these symptoms are related to lack of estrogen.   Pelvic exam:  Cuff well healed, nontender; both adnexa palpable, found  to be normal.  Laparoscopic incision sites clean and dry and well  healed.   IMPRESSION:  Status post hysterectomy postoperative visit.   PLAN:  The patient at this point was interested in reducing her estrogen  however, she is not having any symptoms related to same.  I indicated  that she first needs to have her psychiatric issues under good control  before even attempting to reduce her estrogen needs.  I explained to her  that for a young lady 1 mg twice a day of estradiol is about right.  In  about 6-8 months I suggested after she is in a more stable mental state  she could decrease her dosing to one a day and go from there.  She will  return on a p.r.n. basis.           ______________________________  Ginger Carne, MD     SHB/MEDQ  D:  12/18/2006  T:  12/18/2006  Job:  [id number]

## 2011-05-16 NOTE — Op Note (Signed)
Crystal Gibson, Crystal Gibson                ACCOUNT NO.:  [id number]   MEDICAL RECORD NO.:  [id number]          PATIENT TYPE:  INP   LOCATION:  9318                          FACILITY:  [facility]   PHYSICIAN:  Ginger Carne, MD  DATE OF BIRTH:  1971-12-16   DATE OF PROCEDURE:  10/27/2006  DATE OF DISCHARGE:                                 OPERATIVE REPORT   PREOPERATIVE DIAGNOSIS:  Chronic pelvic pain, endometriosis.   POSTOPERATIVE DIAGNOSIS:  Chronic pelvic pain, endometriosis, endometriosis  of pelvis and appendix.   PROCEDURE:  Laparoscopic-assisted vaginal hysterectomy, bilateral salpingo-  oophorectomy.  Laparoscopic appendectomy.   SURGEON:  Lesly Dukes, M.D.   ASSISTANT:  Ginger Carne, MD   ANESTHESIA:  General.   ESTIMATED BLOOD LOSS:  700 mL.   COMPLICATIONS:  None immediate.   SPECIMEN:  Uterus, cervix, right and left tube, ovary and appendix to  pathology.   OPERATIVE FINDINGS:  Stage II endometriosis of the entire pelvis was noted  including both tubes, ovaries posterior aspect of the uterus and broad  ligaments.  The appendix had endometriotic flecks as well.  Large and small  bowel grossly normal.  There was generalized oozing of the raw surfaces  contributing to blood loss.   OPERATIVE PROCEDURE:  The patient prepped and draped in usual fashion and  placed in lithotomy position.  Betadine solution used for antiseptic and the  patient was catheterized prior to procedure.  After adequate general  anesthesia, tenaculum placed on the anterior lip of the cervix and a Pelosi  uterine manipulator on the same.  Afterwards a vertical infraumbilical  incision was made.  The Veress needle placed in the abdomen.  Opening  closing pressures were 10-15 mmHg.  Needle released, trocar placed in same  incision.  Laparoscope placed in trocar sleeve, two 5 mm ports were made  left lower quadrant, in the left hypogastric region and one in the right  lower quadrant under  direct visualization.  The mesoappendix was identified,  bipolar cauterized and cut cut to the base.  Following this, two 0 Vicryl  loop ties were placed in the base, another one 8 mm above the first two.  The appendix was cut above the first two ties, removed with an Endopouch  bag.  The base was dry, irrigated lactated Ringer's.  Afterwards, attention  was directed to the hysterectomy proper.   The ureters identified bilaterally throughout the pelvic course.  The  infundibulopelvic ligaments were bipolar cauterized and cut including their  respective round ligaments.  At this point the vaginal portion of the  procedure was carried out, repositioning and removal of the [facility] uterine  manipulator followed by double tooth tenaculum on the anterior and posterior  lips of the cervix.  2 cm of anterior posterior vaginal epithelium were  incised transversely.  Uterosacral cardinal ligament complexes clamped and  ligated 0 Vicryl suture.  Afterwards uterine vasculature was clamped and  ligated with 0 Vicryl suture in a standard Richardson fashion.  Broad  ligaments similarly clamped and ligated with 0 Vicryl suture.  At this point  the uterus, cervix, tubes and ovaries were removed.  Bleeding points  hemostatically checked.  Blood clots removed.  Closure of the cuff in one  layer of 0 Vicryl running interlocking suture.  Bleeding points  hemostatically checked.  Blood clots removed.  Reinspection of the pelvis  with laparoscope revealed no active bleeding.  Irrigation followed with  irrigant removed.  Cuff was dry.  Afterwards gas released, trocars removed.  Closure with the 10 mm fascia site with 0 Vicryl suture and 4-0 Vicryl for  subcuticular closure.  Instrument and sponge count were correct.  The  patient tolerated the procedure well, returned to post anesthesia recovery  room in excellent condition.      Ginger Carne, MD  Electronically Signed     SHB/MEDQ  D:  10/27/2006   T:  10/28/2006  Job:  [id number]

## 2011-05-16 NOTE — Group Therapy Note (Signed)
Crystal Gibson, SEK                ACCOUNT NO.:  [id number]   MEDICAL RECORD NO.:  [id number]          PATIENT TYPE:  WOC   LOCATION:  [facility]                   FACILITY:  [facility]   PHYSICIAN:  Tinnie Gens, MD        DATE OF BIRTH:  [birth year]   DATE OF SERVICE:  09/11/2005                                    CLINIC NOTE   CHIEF COMPLAINT:  One week postpartum from cesarean section having  difficulty with narcotic medication causing nausea as well as a rash in her  groin area.   SUBJECTIVE:  Ms. Ellender is a 40 year old female who is one week postpartum.  She is status post cesarean section.  She was prescribed oxycodone with  aspirin, Percocet 5/325 for pain; however, this has been causing her nausea  so she has not been taking it.  She still is having significant pain  according to her.  She also states that ibuprofen which was also prescribed  is causing her to have some diarrhea.  She would like something different.  Otherwise, apparently while in the hospital the mesh underwear that she was  given to wear caused her to have a reaction in her groin area and she would  like this evaluated today.   OBJECTIVE:  VITAL SIGNS:  Temperature 100, pulse 79, blood pressure 173/79,  weight 217, height 5 feet 8 inches.  GENERAL:  She is alert and oriented x4 today, in no acute distress.  ABDOMEN:  Soft, nontender.  Surgical incision is clean, dry, and intact with  Steri-Strips still intact along the length of the incision and no signs of  local erythema or induration.  SKIN:  Focused exam of the groin revealed somewhat darkened, hyperpigmented  skin in the groin region and inner thigh.  Patient states, however, this is  not new for her.  There are no signs of any active lesions or signs of  abscess or ulceration.  No signs of excoriation secondary to itching.  Patient stated the rash did not itch.  There are no linear demarcations of  where the rash, according to her, begins and ends and did  not see any  significant abnormalities of the skin.   ASSESSMENT/PLAN:  1.  Pain postoperative cesarean section.  Will discontinue the Percocet as      well as ibuprofen.  Patient was given a prescription for Ultram 50 mg to      take one to two q.4-6h. p.r.n. for pain, #60 with no refill.  She is not      to exceed more than eight pills in a 24-hour period.  2.  Question contact dermatitis.  Patient has been using Neosporin and      Vaseline on the area.  She will discontinue doing this, instead try a      drying agent such as baby powder.  There are no excoriations.  Patient      does not have      any itching.  She says she has some pain but there is no active signs of      infection currently.  She will return to the clinic as needed for this      issue if it continues.     ______________________________  Donata Clay, M.D.    ______________________________  Tinnie Gens, MD    PM/MEDQ  D:  09/11/2005  T:  09/12/2005  Job:  [id number]

## 2011-05-16 NOTE — Op Note (Signed)
[facility]  Patient:    Crystal Gibson, Crystal Gibson                       MRN: [id number] Proc. Date: 04/18/01 Adm. Date:  [id number] Disc. Date: [id number] Attending:  Michaelle Copas CC:         [facility]   Operative Report  PREOPERATIVE DIAGNOSIS:       Anembryonic gestation.  POSTOPERATIVE DIAGNOSIS:      Anembryonic gestation.  PROCEDURE:                    Suction dilation and evacuation.  SURGEON:                      Charles A. Clearance Coots, M.D.  ANESTHESIA:                   MAC with paracervical block.  ESTIMATED BLOOD LOSS:         100 ml.  COMPLICATIONS:                None.  SPECIMENS:                    Products of conception.  DESCRIPTION OF PROCEDURE:     The patient was brought to the operating room and, after satisfactory IV sedation, the legs were brought up in stirrups and the vagina was prepped and draped in the usual sterile fashion.  The urinary bladder was emptied of approximately 50 cc of clear urine.  Bimanual examination revealed the uterus to be mid position and approximately eight weeks in size.  A sterile speculum was inserted into the vaginal vault and the cervix was isolated.  The anterior lip of the cervix was grasped with a single-tooth tenaculum.  Paracervical block of 2% Xylocaine with 2 ml of bicarbonate was injected in the lateral fornices at the 3 and 9 oclock positions, a total of approximately 20 ml.  The cervix was then dilated to a #25 Pratt dilator.  A #8 suction catheter was easily introduced into the uterine cavity and all contents were evacuated.  There was no active bleeding at the conclusion of the procedure.  The uterus contracted down well.  It was small and firm on follow-up examination.  All instruments were retired.  The patient tolerated the procedure well and was transferred to the recovery room n satisfactory condition. DD:  04/19/01 TD:  04/19/01 Job:  [id number]

## 2011-07-04 ENCOUNTER — Ambulatory Visit: Payer: Self-pay

## 2011-10-07 ENCOUNTER — Emergency Department (HOSPITAL_COMMUNITY)
Admission: EM | Admit: 2011-10-07 | Discharge: 2011-10-07 | Disposition: A | Discharge status: Home / Self Care | Payer: Medicaid Other | Attending: Emergency Medicine | Admitting: Emergency Medicine

## 2011-10-07 ENCOUNTER — Encounter (HOSPITAL_COMMUNITY): Payer: Self-pay

## 2011-10-07 ENCOUNTER — Emergency Department (HOSPITAL_COMMUNITY): Payer: Medicaid Other

## 2011-10-07 DIAGNOSIS — J45909 Unspecified asthma, uncomplicated: Secondary | ICD-10-CM | POA: Insufficient documentation

## 2011-10-07 DIAGNOSIS — R51 Headache: Secondary | ICD-10-CM | POA: Insufficient documentation

## 2011-10-07 DIAGNOSIS — Z79899 Other long term (current) drug therapy: Secondary | ICD-10-CM | POA: Insufficient documentation

## 2011-10-07 HISTORY — DX: Other allergy status, other than to drugs and biological substances: Z91.09

## 2011-10-08 ENCOUNTER — Ambulatory Visit: Payer: Self-pay

## 2011-11-19 ENCOUNTER — Ambulatory Visit: Payer: Self-pay | Admitting: Internal Medicine

## 2012-04-17 ENCOUNTER — Emergency Department: Payer: Self-pay | Admitting: Emergency Medicine

## 2012-10-25 ENCOUNTER — Emergency Department: Payer: Self-pay | Admitting: Emergency Medicine

## 2012-11-09 ENCOUNTER — Ambulatory Visit: Payer: Self-pay | Admitting: Internal Medicine

## 2012-11-10 ENCOUNTER — Other Ambulatory Visit: Payer: Self-pay

## 2012-11-10 LAB — COMPREHENSIVE METABOLIC PANEL
Albumin: 3.9 g/dL (ref 3.4–5.0)
Alkaline Phosphatase: 68 U/L (ref 50–136)
Chloride: 100 mmol/L (ref 98–107)
Glucose: 154 mg/dL — ABNORMAL HIGH (ref 65–99)
SGOT(AST): 38 U/L — ABNORMAL HIGH (ref 15–37)
SGPT (ALT): 72 U/L (ref 12–78)
Total Protein: 8.5 g/dL — ABNORMAL HIGH (ref 6.4–8.2)

## 2012-11-10 LAB — CBC WITH DIFFERENTIAL/PLATELET
Basophil #: 0 10*3/uL (ref 0.0–0.1)
Basophil %: 0.3 %
Eosinophil #: 0 10*3/uL (ref 0.0–0.7)
Eosinophil %: 0.5 %
HCT: 41.3 % (ref 35.0–47.0)
HGB: 14.5 g/dL (ref 12.0–16.0)
Lymphocyte #: 1.8 10*3/uL (ref 1.0–3.6)
Lymphocyte %: 62.9 %
MCH: 32 pg (ref 26.0–34.0)
MCV: 91 fL (ref 80–100)
Monocyte #: 0.2 x10 3/mm (ref 0.2–0.9)
Neutrophil #: 0.8 10*3/uL — ABNORMAL LOW (ref 1.4–6.5)
RBC: 4.53 10*6/uL (ref 3.80–5.20)
WBC: 2.9 10*3/uL — ABNORMAL LOW (ref 3.6–11.0)

## 2012-11-10 LAB — TSH: Thyroid Stimulating Horm: 1.01 u[IU]/mL

## 2013-03-08 ENCOUNTER — Encounter (HOSPITAL_COMMUNITY): Payer: Self-pay | Admitting: *Deleted

## 2013-03-08 ENCOUNTER — Emergency Department (HOSPITAL_COMMUNITY): Payer: Medicaid Other

## 2013-03-08 ENCOUNTER — Emergency Department (HOSPITAL_COMMUNITY)
Admission: EM | Admit: 2013-03-08 | Discharge: 2013-03-09 | Disposition: A | Discharge status: Home / Self Care | Payer: Medicaid Other | Attending: Emergency Medicine | Admitting: Emergency Medicine

## 2013-03-08 DIAGNOSIS — W2203XA Walked into furniture, initial encounter: Secondary | ICD-10-CM | POA: Insufficient documentation

## 2013-03-08 DIAGNOSIS — Y929 Unspecified place or not applicable: Secondary | ICD-10-CM | POA: Insufficient documentation

## 2013-03-08 DIAGNOSIS — M79672 Pain in left foot: Secondary | ICD-10-CM

## 2013-03-08 DIAGNOSIS — J45909 Unspecified asthma, uncomplicated: Secondary | ICD-10-CM | POA: Insufficient documentation

## 2013-03-08 DIAGNOSIS — S8990XA Unspecified injury of unspecified lower leg, initial encounter: Secondary | ICD-10-CM | POA: Insufficient documentation

## 2013-03-08 DIAGNOSIS — M25562 Pain in left knee: Secondary | ICD-10-CM

## 2013-03-08 DIAGNOSIS — Z8632 Personal history of gestational diabetes: Secondary | ICD-10-CM | POA: Insufficient documentation

## 2013-03-08 DIAGNOSIS — Y9389 Activity, other specified: Secondary | ICD-10-CM | POA: Insufficient documentation

## 2013-03-08 HISTORY — DX: Gestational diabetes mellitus in pregnancy, unspecified control: O24.419

## 2013-03-08 NOTE — ED Notes (Signed)
Patient transported to X-ray 

## 2013-03-08 NOTE — ED Notes (Signed)
Pt was trying to catch her daughter who was vomiting and about to pass out.  Pt hurt her left foot and her knee.  Cms intact.  Pt can wiggle her toes.  She has swelling to the left foot.

## 2013-03-09 MED ORDER — IBUPROFEN 600 MG PO TABS: 600.0000 mg | ORAL_TABLET | Freq: Four times a day (QID) | ORAL | Status: DC | PRN

## 2013-03-09 NOTE — ED Provider Notes (Signed)
History     CSN: [id number]  Arrival date & time 03/08/13  2227   First MD Initiated Contact with Patient 03/08/13 2306      Chief Complaint  Patient presents with  . Foot Injury    (Consider location/radiation/quality/duration/timing/severity/associated sxs/prior treatment) HPI Pt presents with c/o pain in left foot and knee which began earlier today, pain is constant. .  Her daughter is in the ED with vomiting and she states her daughter was flailing around while vomiting and she was trying to keep her from falling causing her to twist her foot and hit her knee against a piece of furniture.  She has been able to bear weight but with some limping.  Has not had any treatment prior to arrival.  Pain is worse with movement and palapation.  There are no other associated systemic symptoms, there are no other alleviating or modifying factors.   Past Medical History  Diagnosis Date  . Asthma   . Environmental allergies   . Gestational diabetes     Past Surgical History  Procedure Laterality Date  . Abdominal hysterectomy      No family history on file.  History  Substance Use Topics  . Smoking status: Not on file  . Smokeless tobacco: Not on file  . Alcohol Use: Not on file    OB History   Grav Para Term Preterm Abortions TAB SAB Ect Mult Living                  Review of Systems ROS reviewed and all otherwise negative except for mentioned in HPI  Allergies  Review of patient's allergies indicates no known allergies.  Home Medications   Current Outpatient Rx  Name  Route  Sig  Dispense  Refill  . ibuprofen (ADVIL,MOTRIN) 600 MG tablet   Oral   Take 1 tablet (600 mg total) by mouth every 6 (six) hours as needed for pain.   30 tablet   0     BP 138/78  Pulse 83  Temp(Src) 98.6 F (37 C) (Oral)  Resp 20  SpO2 99% Vitals reviewed Physical Exam Physical Examination: General appearance - alert, well appearing, and in no distress Mental status - alert,  oriented to person, place, and time Chest - clear to auscultation, no wheezes, rales or rhonchi, symmetric air entry Heart - normal rate, regular rhythm, normal S1, S2, no murmurs, rubs, clicks or gallops Neurological - alert, oriented, normal speech, no focal findings or movement disorder noted Musculoskeletal - left foot with ttp over lateral aspect, also diffuse ttp over knee- pain with ROM of knee, negative anterior drawer sign, no ttp over head of fibula, no deformity or swelling, leg and foot distally NVI Extremities - peripheral pulses normal, no pedal edema, no clubbing or cyanosis Skin - normal coloration and turgor, no rashes  ED Course  Procedures (including critical care time)  Labs Reviewed - No data to display Dg Knee Complete 4 Views Left  03/09/2013  *RADIOLOGY REPORT*  Clinical Data: Left foot and knee pain post fall tonight  LEFT KNEE - COMPLETE 4+ VIEW  Comparison: None  Findings: Osseous demineralization. Scattered joint space narrowing and spur formation. No acute fracture, dislocation or bone destruction. No knee joint effusion.  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 No acute bony abnormalities.   Original Report Authenticated By: Ulyses Southward, M.D.    Dg Foot Complete Left  03/09/2013  *RADIOLOGY REPORT*  Clinical Data: Left foot and knee pain  post fall tonight  LEFT FOOT - COMPLETE 3+ VIEW  Comparison: None  Findings: Osseous mineralization grossly normal. Joint spaces preserved. No acute fracture, dislocation or bone destruction. Small plantar calcaneal spur.  IMPRESSION: No acute bony abnormalities. Small plantar calcaneal spur.   Original Report Authenticated By: Ulyses Southward, M.D.      1. Foot pain, left   2. Knee pain, acute, left       MDM  Pt presenting with foot and knee pain beginning today after fall at home.  Xray reassuring.  Xray images reviewd and interpreted by me as well.  Pt given ibuprofen for discomfort.   Discharged with strict return precautions.  Pt agreeable with plan.        Ethelda Chick, MD 03/09/13 2242

## 2013-03-12 ENCOUNTER — Emergency Department: Payer: Self-pay | Admitting: Emergency Medicine

## 2013-03-12 LAB — CBC WITH DIFFERENTIAL/PLATELET
Basophil %: 0.5 %
Eosinophil #: 0 10*3/uL (ref 0.0–0.7)
Eosinophil %: 0.3 %
HGB: 14.6 g/dL (ref 12.0–16.0)
Lymphocyte #: 1 10*3/uL (ref 1.0–3.6)
MCH: 31.2 pg (ref 26.0–34.0)
MCHC: 34.7 g/dL (ref 32.0–36.0)
MCV: 90 fL (ref 80–100)
Monocyte #: 0.2 x10 3/mm (ref 0.2–0.9)
Monocyte %: 9.2 %
Neutrophil #: 1 10*3/uL — ABNORMAL LOW (ref 1.4–6.5)
Neutrophil %: 46 %
RBC: 4.67 10*6/uL (ref 3.80–5.20)
RDW: 13.5 % (ref 11.5–14.5)

## 2013-03-12 LAB — COMPREHENSIVE METABOLIC PANEL
Albumin: 4 g/dL (ref 3.4–5.0)
Alkaline Phosphatase: 70 U/L (ref 50–136)
BUN: 7 mg/dL (ref 7–18)
Bilirubin,Total: 0.6 mg/dL (ref 0.2–1.0)
Calcium, Total: 8.5 mg/dL (ref 8.5–10.1)
Co2: 27 mmol/L (ref 21–32)
Creatinine: 0.94 mg/dL (ref 0.60–1.30)
EGFR (Non-African Amer.): >60
Glucose: 115 mg/dL — ABNORMAL HIGH (ref 65–99)

## 2013-03-12 LAB — URINALYSIS, COMPLETE
Bacteria: NONE SEEN
Bilirubin,UR: NEGATIVE
Glucose,UR: 150 mg/dL (ref 0–75)
Ketone: NEGATIVE
Nitrite: NEGATIVE
Ph: 6 (ref 4.5–8.0)
Protein: NEGATIVE
RBC,UR: 1 /HPF (ref 0–5)
Specific Gravity: 1.011 (ref 1.003–1.030)
Squamous Epithelial: <1
WBC UR: <1 /HPF (ref 0–5)

## 2013-03-12 LAB — LIPASE, BLOOD: Lipase: 171 U/L (ref 73–393)

## 2013-03-22 ENCOUNTER — Emergency Department (HOSPITAL_COMMUNITY): Payer: Medicaid Other

## 2013-03-22 ENCOUNTER — Emergency Department (HOSPITAL_COMMUNITY)
Admission: EM | Admit: 2013-03-22 | Discharge: 2013-03-22 | Disposition: A | Discharge status: Home / Self Care | Payer: Medicaid Other | Attending: Emergency Medicine | Admitting: Emergency Medicine

## 2013-03-22 ENCOUNTER — Encounter (HOSPITAL_COMMUNITY): Payer: Self-pay | Admitting: Emergency Medicine

## 2013-03-22 DIAGNOSIS — R52 Pain, unspecified: Secondary | ICD-10-CM | POA: Insufficient documentation

## 2013-03-22 DIAGNOSIS — E669 Obesity, unspecified: Secondary | ICD-10-CM | POA: Insufficient documentation

## 2013-03-22 DIAGNOSIS — J3489 Other specified disorders of nose and nasal sinuses: Secondary | ICD-10-CM | POA: Insufficient documentation

## 2013-03-22 DIAGNOSIS — R1310 Dysphagia, unspecified: Secondary | ICD-10-CM | POA: Insufficient documentation

## 2013-03-22 DIAGNOSIS — Z8632 Personal history of gestational diabetes: Secondary | ICD-10-CM | POA: Insufficient documentation

## 2013-03-22 DIAGNOSIS — J45901 Unspecified asthma with (acute) exacerbation: Secondary | ICD-10-CM | POA: Insufficient documentation

## 2013-03-22 DIAGNOSIS — F411 Generalized anxiety disorder: Secondary | ICD-10-CM | POA: Insufficient documentation

## 2013-03-22 DIAGNOSIS — J020 Streptococcal pharyngitis: Secondary | ICD-10-CM

## 2013-03-22 DIAGNOSIS — R599 Enlarged lymph nodes, unspecified: Secondary | ICD-10-CM | POA: Insufficient documentation

## 2013-03-22 DIAGNOSIS — R509 Fever, unspecified: Secondary | ICD-10-CM | POA: Insufficient documentation

## 2013-03-22 LAB — RAPID STREP SCREEN (MED CTR MEBANE ONLY): Streptococcus, Group A Screen (Direct): POSITIVE — AB

## 2013-03-22 MED ORDER — OXYCODONE-ACETAMINOPHEN 5-325 MG PO TABS
1.0000 | ORAL_TABLET | Freq: Once | ORAL | Status: AC
  Administered 2013-03-22: 1 via ORAL
  Filled 2013-03-22: qty 1

## 2013-03-22 MED ORDER — PENICILLIN G BENZATHINE 1200000 UNIT/2ML IM SUSP
1.2000 10*6.[IU] | Freq: Once | INTRAMUSCULAR | Status: AC
  Administered 2013-03-22: 1.2 10*6.[IU] via INTRAMUSCULAR
  Filled 2013-03-22: qty 2

## 2013-03-22 MED ORDER — DEXAMETHASONE SODIUM PHOSPHATE 4 MG/ML IJ SOLN: 4.0000 mg | Freq: Once | INTRAMUSCULAR | Status: DC

## 2013-03-22 MED ORDER — DEXAMETHASONE SODIUM PHOSPHATE 4 MG/ML IJ SOLN
4.0000 mg | Freq: Once | INTRAMUSCULAR | Status: AC
  Administered 2013-03-22: 4 mg via INTRAMUSCULAR
  Filled 2013-03-22: qty 1

## 2013-03-22 MED ORDER — HYDROCODONE-ACETAMINOPHEN 5-325 MG PO TABS: 1.0000 | ORAL_TABLET | ORAL | Status: DC | PRN

## 2013-03-22 MED ORDER — ACETAMINOPHEN 325 MG PO TABS
650.0000 mg | ORAL_TABLET | Freq: Once | ORAL | Status: AC
  Administered 2013-03-22: 650 mg via ORAL
  Filled 2013-03-22: qty 2

## 2013-03-22 NOTE — ED Notes (Signed)
Pt. Stated, I've had a sore throat so bad i was drooling last night, i can't hardly swallow.  My body hurts all over.

## 2013-03-22 NOTE — ED Provider Notes (Signed)
Medical screening examination/treatment/procedure(s) were conducted as a shared visit with non-physician practitioner(s) and myself.  I personally evaluated the patient during the encounter  Pt with sore throat, febrile illness.  No trismus, no stridor.  No peritonsillar abscess, uvula is midline.  Posterior oropharynx is erythematous.  No airway compromise.  Pt given IM Bicillin, steroids for swelling, strict return instructions and encouraged close follow up with PCP.  Normotensive, HR improved with antipyretic administration.    Impression: Strep pharyngitis   Crystal Gibson. Jehan Bonano, MD 03/22/13 1052

## 2013-03-22 NOTE — ED Provider Notes (Signed)
History     CSN: [id number]  Arrival date & time 03/22/13  4098   First MD Initiated Contact with Patient 03/22/13 [phone]      Chief Complaint  Patient presents with  . Sore Throat    (Consider location/radiation/quality/duration/timing/severity/associated sxs/prior treatment) HPI Comments: Pt presents to the ED for sore throat x few days.  Notes that soreness has increased and now has painful swallowing, drooling, and some intermittent SOB.   States she feels like she has a lot of thick mucus in her throat which she is not able to swallow nor cough up.   Associated sx include generalized body aches and fever.  Pt denies any sick contacts.  Has not had a flu shot this year.  Denies any abdominal pain, nausea, vomiting, diarrhea, or dysuria.  The history is provided by the patient.    Past Medical History  Diagnosis Date  . Asthma   . Environmental allergies   . Gestational diabetes     Past Surgical History  Procedure Laterality Date  . Abdominal hysterectomy      No family history on file.  History  Substance Use Topics  . Smoking status: Not on file  . Smokeless tobacco: Not on file  . Alcohol Use: Not on file    OB History   Grav Para Term Preterm Abortions TAB SAB Ect Mult Living                  Review of Systems  HENT: Positive for congestion and sore throat.   Respiratory: Positive for shortness of breath.   All other systems reviewed and are negative.    Allergies  Review of patient's allergies indicates no known allergies.  Home Medications   Current Outpatient Rx  Name  Route  Sig  Dispense  Refill  . ibuprofen (ADVIL,MOTRIN) 600 MG tablet   Oral   Take 1 tablet (600 mg total) by mouth every 6 (six) hours as needed for pain.   30 tablet   0     BP 119/81  Pulse 127  Temp(Src) 102.3 F (39.1 C) (Oral)  SpO2 98%  Physical Exam  Nursing note and vitals reviewed. Constitutional: She is oriented to person, place, and time.  Obese   HENT:  Head: Normocephalic and atraumatic. No trismus in the jaw.  Right Ear: Tympanic membrane and ear canal normal.  Left Ear: Tympanic membrane and ear canal normal.  Nose: Nose normal.  Mouth/Throat: Uvula is midline and mucous membranes are normal. No oral lesions. No dental abscesses. Posterior oropharyngeal erythema present. No oropharyngeal exudate or tonsillar abscesses.  Tonsils swollen 2+ without evidence of exudate  Eyes: Conjunctivae and EOM are normal. Pupils are equal, round, and reactive to light.  Neck: Normal range of motion. Neck supple.  Cardiovascular: Normal rate and regular rhythm.   Pulmonary/Chest: Effort normal and breath sounds normal. She has no wheezes. She has no rhonchi.  Abdominal: Soft. Bowel sounds are normal. There is no tenderness. There is no CVA tenderness.  Musculoskeletal: Normal range of motion.  Lymphadenopathy:    She has cervical adenopathy (L and R anterior).  Neurological: She is alert and oriented to person, place, and time.  Skin: Skin is warm and dry.  Psychiatric: Her speech is normal. Her mood appears anxious.    ED Course  Procedures (including critical care time)  Labs Reviewed  RAPID STREP SCREEN - Abnormal; Notable for the following:    Streptococcus, Group A Screen (Direct) POSITIVE (*)  All other components within normal limits   Dg Chest 2 View  03/22/2013  *RADIOLOGY REPORT*  Clinical Data: Cough, sore throat.  Cough  CHEST - 2 VIEW  Comparison: None  Findings: The heart size and mediastinal contours are within normal limits.  Both lungs are clear.  The visualized skeletal structures are unremarkable.  IMPRESSION: Negative exam.   Original Report Authenticated By: Signa Kell, M.D.      1. Strep pharyngitis       MDM   Pt presenting to ED for sore throat increasing over the past few days.  + for strep pharyngitis.  CXR negative for bronchitis or pneumonia.  No tonsillar or peri-tonsillar abscess noted.  Pt  swallowing tylenol and pain medications in the ED without difficulty.  O2 sats normal.  Pt given 1.62mil units Pen G and 4mg  Decadron in the ED.  Pt requesting pain medications- Rx few vicodin given.  Return precautions advised.       Garlon Hatchet, PA-C 03/22/13 1812

## 2013-03-24 NOTE — ED Provider Notes (Signed)
Medical screening examination/treatment/procedure(s) were conducted as a shared visit with non-physician practitioner(s) and myself.  I personally evaluated the patient during the encounter  Pt with fever, sore throat.  Generalized fatigue and weakness but normotensive, not septic.  No airway compromise, no stridor, can tolerate drinking fluids.  + strep throat, given IVFs, antipyretics, steroids and abx given.  Pt given return precautions and instructed on close follow up.  Impression: Strep pharyngitis   Gavin Pound. Estevon Fluke, MD 03/24/13 1020

## 2013-03-25 ENCOUNTER — Emergency Department (HOSPITAL_COMMUNITY): Payer: Medicaid Other

## 2013-03-25 ENCOUNTER — Encounter (HOSPITAL_COMMUNITY): Payer: Self-pay

## 2013-03-25 ENCOUNTER — Emergency Department (HOSPITAL_COMMUNITY)
Admission: EM | Admit: 2013-03-25 | Discharge: 2013-03-25 | Disposition: A | Discharge status: Home / Self Care | Payer: Medicaid Other | Attending: Emergency Medicine | Admitting: Emergency Medicine

## 2013-03-25 DIAGNOSIS — R05 Cough: Secondary | ICD-10-CM | POA: Insufficient documentation

## 2013-03-25 DIAGNOSIS — R1310 Dysphagia, unspecified: Secondary | ICD-10-CM | POA: Insufficient documentation

## 2013-03-25 DIAGNOSIS — R059 Cough, unspecified: Secondary | ICD-10-CM | POA: Insufficient documentation

## 2013-03-25 DIAGNOSIS — R509 Fever, unspecified: Secondary | ICD-10-CM | POA: Insufficient documentation

## 2013-03-25 DIAGNOSIS — J3489 Other specified disorders of nose and nasal sinuses: Secondary | ICD-10-CM | POA: Insufficient documentation

## 2013-03-25 DIAGNOSIS — R21 Rash and other nonspecific skin eruption: Secondary | ICD-10-CM | POA: Insufficient documentation

## 2013-03-25 DIAGNOSIS — J45909 Unspecified asthma, uncomplicated: Secondary | ICD-10-CM | POA: Insufficient documentation

## 2013-03-25 DIAGNOSIS — Z8632 Personal history of gestational diabetes: Secondary | ICD-10-CM | POA: Insufficient documentation

## 2013-03-25 DIAGNOSIS — J029 Acute pharyngitis, unspecified: Secondary | ICD-10-CM

## 2013-03-25 DIAGNOSIS — R51 Headache: Secondary | ICD-10-CM | POA: Insufficient documentation

## 2013-03-25 LAB — CBC WITH DIFFERENTIAL/PLATELET
Basophils Absolute: 0 10*3/uL (ref 0.0–0.1)
Basophils Relative: 1 % (ref 0–1)
Eosinophils Absolute: 0 10*3/uL (ref 0.0–0.7)
HCT: 35.8 % — ABNORMAL LOW (ref 36.0–46.0)
Hemoglobin: 12.1 g/dL (ref 12.0–15.0)
Lymphocytes Relative: 66 % — ABNORMAL HIGH (ref 12–46)
Lymphs Abs: 2.3 10*3/uL (ref 0.7–4.0)
MCH: 30 pg (ref 26.0–34.0)
MCHC: 33.8 g/dL (ref 30.0–36.0)
MCV: 88.6 fL (ref 78.0–100.0)
Neutro Abs: 0.8 10*3/uL — ABNORMAL LOW (ref 1.7–7.7)
RDW: 13.3 % (ref 11.5–15.5)

## 2013-03-25 LAB — POCT I-STAT, CHEM 8
Calcium, Ion: 1.21 mmol/L (ref 1.12–1.23)
Chloride: 105 mEq/L (ref 96–112)
Creatinine, Ser: 0.7 mg/dL (ref 0.50–1.10)
Glucose, Bld: 112 mg/dL — ABNORMAL HIGH (ref 70–99)
Hemoglobin: 12.9 g/dL (ref 12.0–15.0)
Potassium: 4 mEq/L (ref 3.5–5.1)

## 2013-03-25 MED ORDER — SODIUM CHLORIDE 0.9 % IV BOLUS (SEPSIS): 250.0000 mL | Freq: Once | INTRAVENOUS | Status: DC

## 2013-03-25 MED ORDER — SODIUM CHLORIDE 0.9 % IV SOLN: INTRAVENOUS | Status: DC

## 2013-03-25 MED ORDER — CLINDAMYCIN HCL 150 MG PO CAPS: 300.0000 mg | ORAL_CAPSULE | Freq: Three times a day (TID) | ORAL | Status: DC

## 2013-03-25 MED ORDER — CEPHALEXIN 500 MG PO CAPS: 500.0000 mg | ORAL_CAPSULE | Freq: Four times a day (QID) | ORAL | Status: DC

## 2013-03-25 MED ORDER — HYDROMORPHONE HCL PF 1 MG/ML IJ SOLN
1.0000 mg | Freq: Once | INTRAMUSCULAR | Status: AC
Start: 2013-03-25 — End: 2013-03-25
  Administered 2013-03-25: 1 mg via INTRAVENOUS
  Filled 2013-03-25: qty 1

## 2013-03-25 MED ORDER — HYDROMORPHONE HCL PF 1 MG/ML IJ SOLN
1.0000 mg | Freq: Once | INTRAMUSCULAR | Status: AC
  Administered 2013-03-25: 1 mg via INTRAVENOUS
  Filled 2013-03-25: qty 1

## 2013-03-25 MED ORDER — ONDANSETRON HCL 4 MG/2ML IJ SOLN
4.0000 mg | Freq: Once | INTRAMUSCULAR | Status: AC
  Administered 2013-03-25: 4 mg via INTRAVENOUS
  Filled 2013-03-25: qty 2

## 2013-03-25 MED ORDER — IOHEXOL 300 MG/ML  SOLN
75.0000 mL | Freq: Once | INTRAMUSCULAR | Status: AC | PRN
  Administered 2013-03-25: 75 mL via INTRAVENOUS

## 2013-03-25 MED ORDER — SODIUM CHLORIDE 0.9 % IV SOLN
INTRAVENOUS | Status: DC
  Administered 2013-03-25: 11:00:00 via INTRAVENOUS

## 2013-03-25 MED ORDER — NAPROXEN 500 MG PO TABS: 500.0000 mg | ORAL_TABLET | Freq: Two times a day (BID) | ORAL | Status: DC

## 2013-03-25 MED ORDER — HYDROCODONE-ACETAMINOPHEN 5-325 MG PO TABS: 1.0000 | ORAL_TABLET | Freq: Four times a day (QID) | ORAL | Status: DC | PRN

## 2013-03-25 MED ORDER — SODIUM CHLORIDE 0.9 % IV BOLUS (SEPSIS)
1000.0000 mL | Freq: Once | INTRAVENOUS | Status: AC
  Administered 2013-03-25: 1000 mL via INTRAVENOUS

## 2013-03-25 NOTE — ED Notes (Signed)
Pt returned from XR/CT placed back on monitor

## 2013-03-25 NOTE — ED Provider Notes (Signed)
History     CSN: [id number]  Arrival date & time 03/25/13  2725   First MD Initiated Contact with Patient 03/25/13 [phone]      Chief Complaint  Patient presents with  . Sore Throat  . Nasal Congestion    (Consider location/radiation/quality/duration/timing/severity/associated sxs/prior treatment) Patient is a 42 y.o. female presenting with pharyngitis. The history is provided by the patient and the spouse.  Sore Throat Associated symptoms include headaches. Pertinent negatives include no abdominal pain and no shortness of breath.   patient seen in the emergency department here on March 25 for a pharyngitis similar symptoms. Rapid strep test was positive for strep pharyngitis. Patient received Bicillin. Patient returns today with worse symptoms and not improved at all. Patient now states that she's got a blister type rash on the right side of her mouth and face and also the left side of the mouth. Associated with fever patient does not have a history of strep pharyngitis in the past. No nausea no vomiting. Also associated with productive cough with white sputum occasional blood streaks.  Past Medical History  Diagnosis Date  . Asthma   . Environmental allergies   . Gestational diabetes     Past Surgical History  Procedure Laterality Date  . Abdominal hysterectomy      History reviewed. No pertinent family history.  History  Substance Use Topics  . Smoking status: Not on file  . Smokeless tobacco: Not on file  . Alcohol Use: Not on file    OB History   Grav Para Term Preterm Abortions TAB SAB Ect Mult Living                  Review of Systems  Constitutional: Positive for fever.  HENT: Positive for sore throat and trouble swallowing.   Eyes: Negative for redness.  Respiratory: Positive for cough. Negative for shortness of breath.   Gastrointestinal: Negative for nausea, vomiting and abdominal pain.  Musculoskeletal: Negative for back pain.  Skin: Positive for rash.   Neurological: Positive for headaches.  Hematological: Does not bruise/bleed easily.  Psychiatric/Behavioral: Negative for confusion.    Allergies  Review of patient's allergies indicates no known allergies.  Home Medications   Current Outpatient Rx  Name  Route  Sig  Dispense  Refill  . acetaminophen (TYLENOL) 325 MG tablet   Oral   Take 650 mg by mouth every 6 (six) hours as needed for pain.         Marland Kitchen HYDROcodone-acetaminophen (NORCO/VICODIN) 5-325 MG per tablet   Oral   Take 1 tablet by mouth every 4 (four) hours as needed for pain.         . clindamycin (CLEOCIN) 150 MG capsule   Oral   Take 2 capsules (300 mg total) by mouth 3 (three) times daily.   30 capsule   0     BP 100/62  Pulse 75  Temp(Src) 98.7 F (37.1 C) (Oral)  Resp 16  SpO2 94%  Physical Exam  Nursing note and vitals reviewed. Constitutional: She appears well-developed and well-nourished. No distress.  HENT:  Head: Normocephalic and atraumatic.  Mouth/Throat: No oropharyngeal exudate.  Uvula is midline. The erythema to the back of throat no exudate. Patient has small blisters throughout the mouth not on the soft palate. Perioral predominantly on the right side has some small blisters on the outside these lesions are intact.  Eyes: Conjunctivae and EOM are normal. Pupils are equal, round, and reactive to light.  Neck: Normal  range of motion. Neck supple.  Cardiovascular: Normal rate, regular rhythm and normal heart sounds.   No murmur heard. Pulmonary/Chest: Effort normal and breath sounds normal. No respiratory distress. She has no wheezes. She has no rales.  Abdominal: Soft. Bowel sounds are normal. There is no tenderness.    ED Course  Procedures (including critical care time)  Labs Reviewed  CBC WITH DIFFERENTIAL - Abnormal; Notable for the following:    WBC 3.5 (*)    HCT 35.8 (*)    Neutrophils Relative 23 (*)    Lymphocytes Relative 66 (*)    Neutro Abs 0.8 (*)    All other  components within normal limits  POCT I-STAT, CHEM 8 - Abnormal; Notable for the following:    Glucose, Bld 112 (*)    All other components within normal limits   Dg Chest 2 View  03/25/2013  *RADIOLOGY REPORT*  Clinical history:  Sore throat and nasal congestion  CHEST - 2 VIEW  Comparison: March 22, 2013  Findings: Lungs clear.  Heart size and pulmonary vascularity are normal.  No adenopathy.  No bone lesions.  IMPRESSION: No abnormality noted.   Original Report Authenticated By: Bretta Bang, M.D.    Ct Soft Tissue Neck W Contrast  03/25/2013  *RADIOLOGY REPORT*  Clinical Data: Sore throat, nasal congestion, cough.  CT NECK WITH CONTRAST  Technique:  Multidetector CT imaging of the neck was performed with intravenous contrast.  Contrast: 75mL OMNIPAQUE IOHEXOL 300 MG/ML  SOLN  Comparison: None.  Findings: Normal vascular enhancement.  Multiple dental restorations.  Visualized paranasal sinuses appear normally aerated.  There is shotty bilateral posterior jugular nodes, all less than 1 cm short axis diameter.  No evidence of abscess.  No focal inflammatory/edematous change evident.  Visualized lung apices clear.  No mucosal lesion evident. No prevertebral soft tissue swelling or retropharyngeal gas. Regional bones unremarkable.  IMPRESSION:  1.  Negative for abscess, mass, or adenopathy.   Original Report Authenticated By: D. Andria Rhein, MD    Results for orders placed during the hospital encounter of 03/25/13  CBC WITH DIFFERENTIAL      Result Value Range   WBC 3.5 (*) 4.0 - 10.5 K/uL   RBC 4.04  3.87 - 5.11 MIL/uL   Hemoglobin 12.1  12.0 - 15.0 g/dL   HCT 09.8 (*) 11.9 - 14.7 %   MCV 88.6  78.0 - 100.0 fL   MCH 30.0  26.0 - 34.0 pg   MCHC 33.8  30.0 - 36.0 g/dL   RDW 82.9  56.2 - 13.0 %   Platelets 261  150 - 400 K/uL   Neutrophils Relative 23 (*) 43 - 77 %   Lymphocytes Relative 66 (*) 12 - 46 %   Monocytes Relative 10  3 - 12 %   Eosinophils Relative 0  0 - 5 %   Basophils  Relative 1  0 - 1 %   Neutro Abs 0.8 (*) 1.7 - 7.7 K/uL   Lymphs Abs 2.3  0.7 - 4.0 K/uL   Monocytes Absolute 0.4  0.1 - 1.0 K/uL   Eosinophils Absolute 0.0  0.0 - 0.7 K/uL   Basophils Absolute 0.0  0.0 - 0.1 K/uL   Smear Review MORPHOLOGY UNREMARKABLE    POCT I-STAT, CHEM 8      Result Value Range   Sodium 141  135 - 145 mEq/L   Potassium 4.0  3.5 - 5.1 mEq/L   Chloride 105  96 - 112 mEq/L  BUN 10  6 - 23 mg/dL   Creatinine, Ser 1.61  0.50 - 1.10 mg/dL   Glucose, Bld 096 (*) 70 - 99 mg/dL   Calcium, Ion 0.45  4.09 - 1.23 mmol/L   TCO2 29  0 - 100 mmol/L   Hemoglobin 12.9  12.0 - 15.0 g/dL   HCT 81.1  91.4 - 78.2 %     1. Pharyngitis       MDM   Patient seen 3 days ago and positive strep test treated with Bicillin not improved. We'll switch to clindamycin. CT soft tissue neck shows no evidence of a peritonsillar abscess or significant inflammation in the area. Patient also has some evidence of some herpetic cold sores in the oral area that may be due to the stress of the illness. Patient's nontoxic no acute distress room-air saturations 99%. Chest x-ray was negative for any evidence of pneumonia pneumothorax or pulmonary edema.  So the blistering is suggestive possibly of herpes zoster but it seems to be bilateral the stuff on the skin the facial part is all on the right side so could be developing zoster. The other lesions in the oral area are suggestive of viral stomatis.   Patient given per cautions about the possibility of developing herpes zoster she's had in the past as it doesn't seem to remind her of that but it is a possibility it is not explaining the entire symptoms. Also patient may not be truly strep pharyngitis but treatment with Cleocin at this point is still warranted. Could be a viral component to this. Sickly since this patient there a history of pharyngitis before. However a strep test was positive on the 25th. Patient's nontoxic no acute distress.  Patient has  primary care Dr. to followup with.        Shelda Jakes, MD 03/25/13 [phone]

## 2013-03-25 NOTE — ED Notes (Signed)
Pt c/o sore throat, nasal congestion and drainage, productive cough w/white colored "chunks" and blood streaked x3 days.

## 2013-04-14 ENCOUNTER — Other Ambulatory Visit: Payer: Self-pay | Admitting: Physician Assistant

## 2013-04-14 LAB — CBC WITH DIFFERENTIAL/PLATELET
Basophil #: 0 10*3/uL (ref 0.0–0.1)
Basophil %: 0.9 %
Eosinophil #: 0 10*3/uL (ref 0.0–0.7)
Eosinophil %: 0.8 %
HCT: 40 % (ref 35.0–47.0)
Lymphocyte %: 54.3 %
MCH: 30.5 pg (ref 26.0–34.0)
MCHC: 33.8 g/dL (ref 32.0–36.0)
MCV: 90 fL (ref 80–100)
Monocyte #: 0.2 x10 3/mm (ref 0.2–0.9)
Monocyte %: 7.1 %
Neutrophil #: 1.2 10*3/uL — ABNORMAL LOW (ref 1.4–6.5)
Neutrophil %: 36.9 %
RBC: 4.44 10*6/uL (ref 3.80–5.20)

## 2013-04-14 LAB — COMPREHENSIVE METABOLIC PANEL
Albumin: 3.9 g/dL (ref 3.4–5.0)
Anion Gap: 5 — ABNORMAL LOW (ref 7–16)
BUN: 9 mg/dL (ref 7–18)
Bilirubin,Total: 0.5 mg/dL (ref 0.2–1.0)
Calcium, Total: 9.1 mg/dL (ref 8.5–10.1)
Chloride: 107 mmol/L (ref 98–107)
Co2: 27 mmol/L (ref 21–32)
Glucose: 123 mg/dL — ABNORMAL HIGH (ref 65–99)
Osmolality: 278 (ref 275–301)
SGOT(AST): 45 U/L — ABNORMAL HIGH (ref 15–37)
SGPT (ALT): 79 U/L — ABNORMAL HIGH (ref 12–78)
Total Protein: 8.2 g/dL (ref 6.4–8.2)

## 2013-04-14 LAB — LIPASE, BLOOD: Lipase: 325 U/L (ref 73–393)

## 2013-04-14 LAB — AMYLASE: Amylase: 59 U/L (ref 25–115)

## 2013-04-14 LAB — TSH: Thyroid Stimulating Horm: 0.459 u[IU]/mL

## 2013-04-14 LAB — MONONUCLEOSIS SCREEN: Mono Test: NEGATIVE

## 2013-04-16 LAB — THROAT CULTURE

## 2013-04-18 LAB — STOOL CULTURE

## 2013-06-23 ENCOUNTER — Ambulatory Visit: Payer: Self-pay | Admitting: Gastroenterology

## 2013-06-26 LAB — PATHOLOGY REPORT

## 2013-11-04 ENCOUNTER — Encounter: Payer: Self-pay | Admitting: Podiatry

## 2013-11-30 ENCOUNTER — Encounter (INDEPENDENT_AMBULATORY_CARE_PROVIDER_SITE_OTHER): Payer: Medicaid Other | Admitting: Podiatry

## 2013-12-23 ENCOUNTER — Ambulatory Visit (INDEPENDENT_AMBULATORY_CARE_PROVIDER_SITE_OTHER): Payer: Medicaid Other

## 2013-12-23 ENCOUNTER — Ambulatory Visit (INDEPENDENT_AMBULATORY_CARE_PROVIDER_SITE_OTHER): Payer: Medicaid Other | Admitting: Podiatry

## 2013-12-23 ENCOUNTER — Encounter: Payer: Self-pay | Admitting: Podiatry

## 2013-12-23 VITALS — BP 99/64 | HR 100 | Resp 16 | Ht 69.0 in | Wt 240.0 lb

## 2013-12-23 DIAGNOSIS — M2010 Hallux valgus (acquired), unspecified foot: Secondary | ICD-10-CM

## 2013-12-23 DIAGNOSIS — M21619 Bunion of unspecified foot: Secondary | ICD-10-CM

## 2013-12-23 DIAGNOSIS — M21611 Bunion of right foot: Secondary | ICD-10-CM

## 2013-12-23 DIAGNOSIS — L600 Ingrowing nail: Secondary | ICD-10-CM

## 2013-12-23 NOTE — Patient Instructions (Signed)

## 2013-12-23 NOTE — Progress Notes (Signed)
Subjective:     Patient ID: Crystal Gibson, female   DOB: 01/30/1971, 42 y.o.   MRN: [id number]  HPI patient states IM ingrown toenail on my right second toe my bunion is doing well and I know I need my bunion fixed on my left foot   Review of Systems     Objective:   Physical Exam Neurovascular status intact no health history changes with hyperostosis medial aspect first metatarsal head left well-healed surgical site first MPJ right and incurvated nail bed second right    Assessment:     Ingrown toenail right and structural bunion deformity left with well-healed deformity right    Plan:     H&P performed in today and focusing on the nail I infiltrated 60 insight Marcaine mixture remove the medial border exposed matrix and apply chemical phenol followed by alcohol 3 applications followed by alcohol lavaged sterile dressing reappoint 2 weeks discussed correction of left foot

## 2014-01-06 ENCOUNTER — Ambulatory Visit: Payer: Medicaid Other | Admitting: Podiatry

## 2014-01-10 ENCOUNTER — Telehealth: Payer: Self-pay | Admitting: *Deleted

## 2014-01-10 NOTE — Telephone Encounter (Signed)
L/M FOR [facility]. STATED PT MISSED HER PREVIOUS APPT AND [facility]. SURGERY IS SCHEDULED FOR TUES 1.20.15 WITH DR Charlsie MerlesEGAL

## 2014-01-13 ENCOUNTER — Telehealth: Payer: Self-pay | Admitting: *Deleted

## 2014-01-13 ENCOUNTER — Encounter: Payer: Self-pay | Admitting: *Deleted

## 2014-01-13 NOTE — Progress Notes (Unsigned)
Per Crystal Gibson pt called surgery center and wanted to r/s surgery. i called pt and l/m for pt to call office to r/s surgery and also sch appt to come in for consent forms.

## 2014-01-13 NOTE — Telephone Encounter (Signed)
CALLED AND L/M FOR PT TO RETURN CALL TO R/S SURGERY AND SIGN CONSENT FORMS. CYNTHIA AT SURGERY CENTER SAID PT CALLED THERE AND NEEDED TO R/S SURGERY THAT WAS SCH ON 1.20.15.

## 2014-01-24 ENCOUNTER — Encounter: Payer: Self-pay | Admitting: Podiatry

## 2014-02-26 ENCOUNTER — Encounter (HOSPITAL_COMMUNITY): Payer: Self-pay | Admitting: Emergency Medicine

## 2014-02-26 ENCOUNTER — Emergency Department (HOSPITAL_COMMUNITY)
Admission: EM | Admit: 2014-02-26 | Discharge: 2014-02-26 | Disposition: A | Discharge status: Home / Self Care | Payer: Medicaid Other | Attending: Emergency Medicine | Admitting: Emergency Medicine

## 2014-02-26 DIAGNOSIS — IMO0002 Reserved for concepts with insufficient information to code with codable children: Secondary | ICD-10-CM | POA: Insufficient documentation

## 2014-02-26 DIAGNOSIS — Y9389 Activity, other specified: Secondary | ICD-10-CM | POA: Insufficient documentation

## 2014-02-26 DIAGNOSIS — J45909 Unspecified asthma, uncomplicated: Secondary | ICD-10-CM | POA: Insufficient documentation

## 2014-02-26 DIAGNOSIS — M549 Dorsalgia, unspecified: Secondary | ICD-10-CM

## 2014-02-26 DIAGNOSIS — Y929 Unspecified place or not applicable: Secondary | ICD-10-CM | POA: Insufficient documentation

## 2014-02-26 DIAGNOSIS — Z79899 Other long term (current) drug therapy: Secondary | ICD-10-CM | POA: Insufficient documentation

## 2014-02-26 DIAGNOSIS — Z8632 Personal history of gestational diabetes: Secondary | ICD-10-CM | POA: Insufficient documentation

## 2014-02-26 DIAGNOSIS — X500XXA Overexertion from strenuous movement or load, initial encounter: Secondary | ICD-10-CM | POA: Insufficient documentation

## 2014-02-26 MED ORDER — DIAZEPAM 5 MG PO TABS: 5.0000 mg | ORAL_TABLET | Freq: Two times a day (BID) | ORAL | Status: DC

## 2014-02-26 MED ORDER — TRAMADOL HCL 50 MG PO TABS: 50.0000 mg | ORAL_TABLET | Freq: Four times a day (QID) | ORAL | Status: DC | PRN

## 2014-02-26 NOTE — Discharge Instructions (Signed)
Back Exercises °Back exercises help treat and prevent back injuries. The goal of back exercises is to increase the strength of your abdominal and back muscles and the flexibility of your back. These exercises should be started when you no longer have back pain. Back exercises include: °· Pelvic Tilt. Lie on your back with your knees bent. Tilt your pelvis until the lower part of your back is against the floor. Hold this position 5 to 10 sec and repeat 5 to 10 times. °· Knee to Chest. Pull first 1 knee up against your chest and hold for 20 to 30 seconds, repeat this with the other knee, and then both knees. This may be done with the other leg straight or bent, whichever feels better. °· Sit-Ups or Curl-Ups. Bend your knees 90 degrees. Start with tilting your pelvis, and do a partial, slow sit-up, lifting your trunk only 30 to 45 degrees off the floor. Take at least 2 to 3 seconds for each sit-up. Do not do sit-ups with your knees out straight. If partial sit-ups are difficult, simply do the above but with only tightening your abdominal muscles and holding it as directed. °· Hip-Lift. Lie on your back with your knees flexed 90 degrees. Push down with your feet and shoulders as you raise your hips a couple inches off the floor; hold for 10 seconds, repeat 5 to 10 times. °· Back arches. Lie on your stomach, propping yourself up on bent elbows. Slowly press on your hands, causing an arch in your low back. Repeat 3 to 5 times. Any initial stiffness and discomfort should lessen with repetition over time. °· Shoulder-Lifts. Lie face down with arms beside your body. Keep hips and torso pressed to floor as you slowly lift your head and shoulders off the floor. °Do not overdo your exercises, especially in the beginning. Exercises may cause you some mild back discomfort which lasts for a few minutes; however, if the pain is more severe, or lasts for more than 15 minutes, do not continue exercises until you see your caregiver.  Improvement with exercise therapy for back problems is slow.  °See your caregivers for assistance with developing a proper back exercise program. °Document Released: 01/22/2005 Document Revised: 03/08/2012 Document Reviewed: 10/16/2011 °Exit[employer]® Patient Information ©2014 [employer]. ° °Back Pain, Adult °Low back pain is very common. About 1 in 5 people have back pain. The cause of low back pain is rarely dangerous. The pain often gets better over time. About half of people with a sudden onset of back pain feel better in just 2 weeks. About 8 in 10 people feel better by 6 weeks.  °CAUSES °Some common causes of back pain include: °· Strain of the muscles or ligaments supporting the spine. °· Wear and tear (degeneration) of the spinal discs. °· Arthritis. °· Direct injury to the back. °DIAGNOSIS °Most of the time, the direct cause of low back pain is not known. However, back pain can be treated effectively even when the exact cause of the pain is unknown. Answering your caregiver's questions about your overall health and symptoms is one of the most accurate ways to make sure the cause of your pain is not dangerous. If your caregiver needs more information, he or she may order lab work or imaging tests (X-rays or MRIs). However, even if imaging tests show changes in your back, this usually does not require surgery. °HOME CARE INSTRUCTIONS °For many people, back pain returns. Since low back pain is rarely dangerous, it is often a condition that people   can learn to manage on their own.  °· Remain active. It is stressful on the back to sit or stand in one place. Do not sit, drive, or stand in one place for more than 30 minutes at a time. Take short walks on level surfaces as soon as pain allows. Try to increase the length of time you walk each day. °· Do not stay in bed. Resting more than 1 or 2 days can delay your recovery. °· Do not avoid exercise or work. Your body is made to move. It is not dangerous to be active,  even though your back may hurt. Your back will likely heal faster if you return to being active before your pain is gone. °· Pay attention to your body when you  bend and lift. Many people have less discomfort when lifting if they bend their knees, keep the load close to their bodies, and avoid twisting. Often, the most comfortable positions are those that put less stress on your recovering back. °· Find a comfortable position to sleep. Use a firm mattress and lie on your side with your knees slightly bent. If you lie on your back, put a pillow under your knees. °· Only take over-the-counter or prescription medicines as directed by your caregiver. Over-the-counter medicines to reduce pain and inflammation are often the most helpful. Your caregiver may prescribe muscle relaxant drugs. These medicines help dull your pain so you can more quickly return to your normal activities and healthy exercise. °· Put ice on the injured area. °· Put ice in a plastic bag. °· Place a towel between your skin and the bag. °· Leave the ice on for 15-20 minutes, 03-04 times a day for the first 2 to 3 days. After that, ice and heat may be alternated to reduce pain and spasms. °· Ask your caregiver about trying back exercises and gentle massage. This may be of some benefit. °· Avoid feeling anxious or stressed. Stress increases muscle tension and can worsen back pain. It is important to recognize when you are anxious or stressed and learn ways to manage it. Exercise is a great option. °SEEK MEDICAL CARE IF: °· You have pain that is not relieved with rest or medicine. °· You have pain that does not improve in 1 week. °· You have new symptoms. °· You are generally not feeling well. °SEEK IMMEDIATE MEDICAL CARE IF:  °· You have pain that radiates from your back into your legs. °· You develop new bowel or bladder control problems. °· You have unusual weakness or numbness in your arms or legs. °· You develop nausea or vomiting. °· You develop  abdominal pain. °· You feel faint. °Document Released: 12/15/2005 Document Revised: 06/15/2012 Document Reviewed: 05/05/2011 °ExitCare® Patient Information ©2014 [employer]. ° °

## 2014-02-26 NOTE — ED Notes (Addendum)
Pt reports she was pushing door to car trunk closed and she began to have severe lower back pain. States it feels like when she had a pulled muscle. Also she wants the doctor to look at a cyst on her L wrist, nonpainful lump under skin. Ambulatory, mae.

## 2014-02-26 NOTE — ED Provider Notes (Signed)
Medical screening examination/treatment/procedure(s) were performed by non-physician practitioner and as supervising physician I was immediately available for consultation/collaboration.   EKG Interpretation None       Crystal RudeNathan R. Rubin PayorPickering, MD 02/26/14 2350

## 2014-02-26 NOTE — ED Notes (Signed)
Pt sts last night she was closing her trunk when she felt like a pulling sensation to her back.  Sts the pain radiates down her right leg to her chest.  Describes pain as sharp, stabbing pain.  Sts she has had some stress incontinence due to not being able to get up to go to restroom due to the pain. Pt also here to get cyst on left wrist checked out.  No redness or warmth noted; tender to touch; cyst not moveable.

## 2014-02-26 NOTE — ED Provider Notes (Signed)
CSN: [id number]     Arrival date & time 02/26/14  1620 History  This chart was scribed for non-physician practitioner, Roxy Horsemanobert Laureano Hetzer, PA-C, working with Juliet RudeNathan R. Rubin PayorPickering, MD by Charline BillsEssence Howell, ED Scribe. This patient was seen in room TR06C/TR06C and the patient's care was started at 6:00 PM.   Chief Complaint  Patient presents with  . Back Pain    The history is provided by the patient. No language interpreter was used.    HPI Comments: Crystal Gibson is a 43 y.o. female who presents to the Emergency Department complaining of sharp back pain onset last night after closing her trunk. She states that she felt a pulling sensation in her back afterwards. Pt reports pain with raising her leg. She states that she has experienced stress incontinence due to inability to get up and use the restroom due to pain, but states that she can feel her bladder filling, and has not had any other loss of bowel or bladder function.Pt denies fever and chills. Pt has had similar pain and uses heat compresses with mild relief.   Past Medical History  Diagnosis Date  . Asthma   . Environmental allergies   . Gestational diabetes    Past Surgical History  Procedure Laterality Date  . Abdominal hysterectomy     History reviewed. No pertinent family history. History  Substance Use Topics  . Smoking status: Never Smoker   . Smokeless tobacco: Never Used  . Alcohol Use: No   OB History   Grav Para Term Preterm Abortions TAB SAB Ect Mult Living                 Review of Systems  Constitutional: Negative for fever and chills.  Gastrointestinal: Negative for nausea and vomiting.       No bowel incontinence  Genitourinary:       No urinary incontinence  Musculoskeletal: Positive for arthralgias, back pain and myalgias.  Neurological:       No saddle anesthesia    Allergies  Review of patient's allergies indicates no known allergies.  Home Medications   Current Outpatient Rx  Name  Route  Sig   Dispense  Refill  . acetaminophen (TYLENOL) 325 MG tablet   Oral   Take 650 mg by mouth every 6 (six) hours as needed for pain.         Marland Kitchen. albuterol (PROVENTIL HFA;VENTOLIN HFA) 108 (90 BASE) MCG/ACT inhaler   Inhalation   Inhale 2 puffs into the lungs every 6 (six) hours as needed for wheezing or shortness of breath.         . Cetirizine HCl (ZYRTEC ALLERGY PO)   Oral   Take 1 tablet by mouth at bedtime.          Marland Kitchen. OVER THE COUNTER MEDICATION   Oral   Take 1 tablet by mouth daily. Weight loss with Apple cider vinegar, kelp, lecithin and b-6         . OVER THE COUNTER MEDICATION   Oral   Take 1 tablet by mouth daily. Womens  essential vitamin         . traZODone (DESYREL) 50 MG tablet   Oral   Take 50 mg by mouth at bedtime.          Triage Vitals: BP 129/58  Pulse 97  Temp(Src) 98.1 F (36.7 C) (Oral)  Resp 18  SpO2 100% Physical Exam  Nursing note and vitals reviewed. Constitutional: She is oriented to person,  place, and time. She appears well-developed and well-nourished. No distress.  HENT:  Head: Normocephalic and atraumatic.  Eyes: Conjunctivae and EOM are normal. Right eye exhibits no discharge. Left eye exhibits no discharge. No scleral icterus.  Neck: Normal range of motion. Neck supple. No tracheal deviation present.  Cardiovascular: Normal rate, regular rhythm and normal heart sounds.  Exam reveals no gallop and no friction rub.   No murmur heard. Pulmonary/Chest: Effort normal and breath sounds normal. No respiratory distress. She has no wheezes.  Abdominal: Soft. She exhibits no distension. There is no tenderness.  Musculoskeletal: Normal range of motion.  Lumbar paraspinal muscles tender to palpation, no bony tenderness, step-offs, or gross abnormality or deformity of spine, patient is able to ambulate, moves all extremities  Bilateral great toe extension intact Bilateral plantar/dorsiflexion intact  Neurological: She is alert and oriented to  person, place, and time. She has normal reflexes.  Sensation and strength intact bilaterally Symmetrical reflexes  Skin: Skin is warm and dry. She is not diaphoretic.  Psychiatric: She has a normal mood and affect. Her behavior is normal. Judgment and thought content normal.    ED Course  Procedures (including critical care time) DIAGNOSTIC STUDIES: Oxygen Saturation is 100% on RA, normal by my interpretation.    COORDINATION OF CARE:  6:07 PM-Discussed treatment plan with pt at bedside and pt agreed to plan.   Labs Review Labs Reviewed - No data to display Imaging Review No results found.   EKG Interpretation None      MDM   Final diagnoses:  Back pain    Patient with back pain.  No neurological deficits and normal neuro exam.  Patient can walk but states is painful.  No loss of bowel or bladder control.  No concern for cauda equina.  No fever, night sweats, weight loss, h/o cancer, IVDU.  RICE protocol and pain medicine indicated and discussed with patient.   I personally performed the services described in this documentation, which was scribed in my presence. The recorded information has been reviewed and is accurate.     Roxy Horseman, PA-C 02/26/14 1814

## 2014-03-01 ENCOUNTER — Ambulatory Visit: Payer: Self-pay | Admitting: Physician Assistant

## 2014-03-06 ENCOUNTER — Encounter: Payer: Self-pay | Admitting: Physician Assistant

## 2014-03-29 ENCOUNTER — Encounter: Payer: Self-pay | Admitting: Physician Assistant

## 2014-04-16 ENCOUNTER — Emergency Department: Payer: Self-pay | Admitting: Emergency Medicine

## 2014-04-19 ENCOUNTER — Ambulatory Visit: Payer: Self-pay | Admitting: Specialist

## 2014-04-19 LAB — BETA STREP CULTURE(ARMC)

## 2014-05-19 ENCOUNTER — Ambulatory Visit (INDEPENDENT_AMBULATORY_CARE_PROVIDER_SITE_OTHER): Payer: Medicaid Other

## 2014-05-19 ENCOUNTER — Ambulatory Visit (INDEPENDENT_AMBULATORY_CARE_PROVIDER_SITE_OTHER): Payer: Medicaid Other | Admitting: Podiatry

## 2014-05-19 ENCOUNTER — Encounter: Payer: Self-pay | Admitting: Podiatry

## 2014-05-19 VITALS — BP 116/82 | HR 78 | Resp 16

## 2014-05-19 DIAGNOSIS — M21619 Bunion of unspecified foot: Secondary | ICD-10-CM

## 2014-05-19 DIAGNOSIS — M7750 Other enthesopathy of unspecified foot: Secondary | ICD-10-CM

## 2014-05-19 NOTE — Progress Notes (Signed)
Subjective:     Patient ID: Crystal Gibson, female   DOB: 1971/10/23, 43 y.o.   MRN: [id number]  HPI patient states I am ready to get my left bunion fixed and also my feet are very flat and are becoming increasingly sore and make it hard for me to walk distances   Review of Systems     Objective:   Physical Exam Neurovascular status intact with well-healed surgical site first metatarsal right and hyperostosis medial aspect first metatarsal head left it's painful and red when palpated. 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 I recommended surgical correction of the left and she wants this done and I allowed her to read a consent form Line by line with ample opportunities for questions. She understands all risks as outlined and understands total recovery will be 6 months to one year. Today we also scanned her for orthotics for the long run and she is scheduled for outpatient surgery

## 2014-05-30 ENCOUNTER — Encounter: Payer: Self-pay | Admitting: Podiatry

## 2014-05-30 DIAGNOSIS — M2010 Hallux valgus (acquired), unspecified foot: Secondary | ICD-10-CM

## 2014-05-31 ENCOUNTER — Telehealth: Payer: Self-pay

## 2014-05-31 NOTE — Telephone Encounter (Signed)
Left vm for pt to call with questions or concerns regarding post op status.

## 2014-06-06 ENCOUNTER — Ambulatory Visit (INDEPENDENT_AMBULATORY_CARE_PROVIDER_SITE_OTHER): Payer: Medicaid Other | Admitting: Podiatry

## 2014-06-06 ENCOUNTER — Encounter: Payer: Self-pay | Admitting: Podiatry

## 2014-06-06 ENCOUNTER — Ambulatory Visit (INDEPENDENT_AMBULATORY_CARE_PROVIDER_SITE_OTHER): Payer: Medicaid Other

## 2014-06-06 VITALS — BP 112/80 | HR 68 | Resp 16

## 2014-06-06 DIAGNOSIS — M2010 Hallux valgus (acquired), unspecified foot: Secondary | ICD-10-CM

## 2014-06-06 DIAGNOSIS — M21619 Bunion of unspecified foot: Secondary | ICD-10-CM

## 2014-06-06 DIAGNOSIS — M79609 Pain in unspecified limb: Secondary | ICD-10-CM

## 2014-06-06 NOTE — Progress Notes (Signed)
Subjective:     Patient ID: Crystal Gibson, female   DOB: May 26, 1971, 43 y.o.   MRN: [id number]  HPI patient states that it's sore I'm walking well on my left foot and satisfied so far  Review of Systems     Objective:   Physical Exam Neurovascular status intact negative Homans sign noted with well-healed surgical site first metatarsal left with wound edges coapted and hallux in rectus position    Assessment:     Doing well post Eliberto Ivory bunionectomy of one week with good alignment noted    Plan:     Reviewed x-rays and reapplied sterile dressing. Continue immobilization and gradually increase activity and reappoint 3 weeks for recheck

## 2014-06-07 ENCOUNTER — Encounter: Payer: Self-pay | Admitting: Podiatry

## 2014-06-07 NOTE — Progress Notes (Signed)
[place] bunionectomy (cutting and moving bone) with pin fixation left

## 2014-06-27 ENCOUNTER — Ambulatory Visit (INDEPENDENT_AMBULATORY_CARE_PROVIDER_SITE_OTHER): Payer: Medicaid Other

## 2014-06-27 ENCOUNTER — Ambulatory Visit (INDEPENDENT_AMBULATORY_CARE_PROVIDER_SITE_OTHER): Payer: Medicaid Other | Admitting: Podiatry

## 2014-06-27 ENCOUNTER — Encounter: Payer: Self-pay | Admitting: Podiatry

## 2014-06-27 VITALS — BP 138/86 | HR 72 | Resp 12

## 2014-06-27 DIAGNOSIS — M2012 Hallux valgus (acquired), left foot: Secondary | ICD-10-CM

## 2014-06-27 DIAGNOSIS — M2010 Hallux valgus (acquired), unspecified foot: Secondary | ICD-10-CM

## 2014-06-27 NOTE — Progress Notes (Signed)
Subjective:     Patient ID: Crystal BendersMarsha D Ridgely, female   DOB: 11/01/1971, 43 y.o.   MRN: [id number]  HPI patient presents stating I'm doing well with his left foot but I been quite a bit more active on it and get sore at the end of the day   Review of Systems     Objective:   Physical Exam Neurovascular status intact with mild edema noted and no erythema noted with incision site healing very well first metatarsal in excellent range of motion with 35 of dorsiflexion 30 of plantarflexion    Assessment:     Doing well left that has been excessively active on her foot    Plan:     Reviewed x-rays and explained continued immobilization for several more weeks and elevation and will be seen back for reappoint was given strict instructions if any increase in swelling should occur or pain to let us know immediately

## 2014-07-21 ENCOUNTER — Other Ambulatory Visit: Payer: Self-pay | Admitting: Physician Assistant

## 2014-07-21 LAB — COMPREHENSIVE METABOLIC PANEL
ALBUMIN: 3.6 g/dL (ref 3.4–5.0)
ALT: 70 U/L — AB
ANION GAP: 7 (ref 7–16)
Alkaline Phosphatase: 66 U/L
BUN: 8 mg/dL (ref 7–18)
Bilirubin,Total: 0.4 mg/dL (ref 0.2–1.0)
CALCIUM: 8.8 mg/dL (ref 8.5–10.1)
CO2: 29 mmol/L (ref 21–32)
Chloride: 105 mmol/L (ref 98–107)
Creatinine: 0.89 mg/dL (ref 0.60–1.30)
EGFR (Non-African Amer.): >60
GLUCOSE: 148 mg/dL — AB (ref 65–99)
OSMOLALITY: 282 (ref 275–301)
POTASSIUM: 4 mmol/L (ref 3.5–5.1)
SGOT(AST): 41 U/L — ABNORMAL HIGH (ref 15–37)
SODIUM: 141 mmol/L (ref 136–145)
Total Protein: 8.2 g/dL (ref 6.4–8.2)

## 2014-07-21 LAB — CBC WITH DIFFERENTIAL/PLATELET
BASOS ABS: 0 10*3/uL (ref 0.0–0.1)
Basophil %: 0.3 %
EOS ABS: 0 10*3/uL (ref 0.0–0.7)
Eosinophil %: 0.6 %
HCT: 42.3 % (ref 35.0–47.0)
HGB: 14 g/dL (ref 12.0–16.0)
LYMPHS ABS: 1.7 10*3/uL (ref 1.0–3.6)
Lymphocyte %: 60.8 %
MCH: 30.9 pg (ref 26.0–34.0)
MCHC: 33.2 g/dL (ref 32.0–36.0)
MCV: 93 fL (ref 80–100)
MONOS PCT: 8 %
Monocyte #: 0.2 x10 3/mm (ref 0.2–0.9)
NEUTROS PCT: 30.3 %
Neutrophil #: 0.9 10*3/uL — ABNORMAL LOW (ref 1.4–6.5)
Platelet: 274 10*3/uL (ref 150–440)
RBC: 4.54 10*6/uL (ref 3.80–5.20)
RDW: 13.4 % (ref 11.5–14.5)
WBC: 2.8 10*3/uL — ABNORMAL LOW (ref 3.6–11.0)

## 2014-07-21 LAB — T4, FREE: Free Thyroxine: 0.8 ng/dL (ref 0.76–1.46)

## 2014-07-21 LAB — TSH: THYROID STIMULATING HORM: 0.553 u[IU]/mL

## 2014-09-14 DIAGNOSIS — Z0289 Encounter for other administrative examinations: Secondary | ICD-10-CM | POA: Insufficient documentation

## 2014-09-17 ENCOUNTER — Encounter (HOSPITAL_BASED_OUTPATIENT_CLINIC_OR_DEPARTMENT_OTHER): Payer: Self-pay | Admitting: Emergency Medicine

## 2014-09-17 ENCOUNTER — Emergency Department (HOSPITAL_BASED_OUTPATIENT_CLINIC_OR_DEPARTMENT_OTHER)
Admission: EM | Admit: 2014-09-17 | Discharge: 2014-09-17 | Disposition: A | Discharge status: Home / Self Care | Payer: Medicaid Other | Attending: Emergency Medicine | Admitting: Emergency Medicine

## 2014-09-17 ENCOUNTER — Emergency Department (HOSPITAL_BASED_OUTPATIENT_CLINIC_OR_DEPARTMENT_OTHER): Payer: Medicaid Other

## 2014-09-17 DIAGNOSIS — E669 Obesity, unspecified: Secondary | ICD-10-CM | POA: Insufficient documentation

## 2014-09-17 DIAGNOSIS — R0602 Shortness of breath: Secondary | ICD-10-CM | POA: Insufficient documentation

## 2014-09-17 DIAGNOSIS — J45901 Unspecified asthma with (acute) exacerbation: Secondary | ICD-10-CM | POA: Insufficient documentation

## 2014-09-17 DIAGNOSIS — J069 Acute upper respiratory infection, unspecified: Secondary | ICD-10-CM | POA: Insufficient documentation

## 2014-09-17 DIAGNOSIS — Z79899 Other long term (current) drug therapy: Secondary | ICD-10-CM | POA: Insufficient documentation

## 2014-09-17 DIAGNOSIS — Z8632 Personal history of gestational diabetes: Secondary | ICD-10-CM | POA: Insufficient documentation

## 2014-09-17 LAB — BASIC METABOLIC PANEL
ANION GAP: 13 (ref 5–15)
BUN: 11 mg/dL (ref 6–23)
CO2: 28 mEq/L (ref 19–32)
Calcium: 9.8 mg/dL (ref 8.4–10.5)
Chloride: 101 mEq/L (ref 96–112)
Creatinine, Ser: 0.9 mg/dL (ref 0.50–1.10)
GFR, EST AFRICAN AMERICAN: 89 mL/min — AB (ref >90)
GFR, EST NON AFRICAN AMERICAN: 77 mL/min — AB (ref >90)
Glucose, Bld: 158 mg/dL — ABNORMAL HIGH (ref 70–99)
POTASSIUM: 4.3 meq/L (ref 3.7–5.3)
Sodium: 142 mEq/L (ref 137–147)

## 2014-09-17 LAB — CBC WITH DIFFERENTIAL/PLATELET
BASOS ABS: 0 10*3/uL (ref 0.0–0.1)
BASOS PCT: 0 % (ref 0–1)
Eosinophils Absolute: 0 10*3/uL (ref 0.0–0.7)
Eosinophils Relative: 1 % (ref 0–5)
HEMATOCRIT: 41.3 % (ref 36.0–46.0)
Hemoglobin: 14.5 g/dL (ref 12.0–15.0)
LYMPHS PCT: 65 % — AB (ref 12–46)
Lymphs Abs: 2.2 10*3/uL (ref 0.7–4.0)
MCH: 31.4 pg (ref 26.0–34.0)
MCHC: 35.1 g/dL (ref 30.0–36.0)
MCV: 89.4 fL (ref 78.0–100.0)
MONO ABS: 0.5 10*3/uL (ref 0.1–1.0)
Monocytes Relative: 15 % — ABNORMAL HIGH (ref 3–12)
NEUTROS ABS: 0.7 10*3/uL — AB (ref 1.7–7.7)
Neutrophils Relative %: 19 % — ABNORMAL LOW (ref 43–77)
Platelets: 287 10*3/uL (ref 150–400)
RBC: 4.62 MIL/uL (ref 3.87–5.11)
RDW: 12.9 % (ref 11.5–15.5)
WBC: 3.4 10*3/uL — ABNORMAL LOW (ref 4.0–10.5)

## 2014-09-17 LAB — RAPID STREP SCREEN (MED CTR MEBANE ONLY): Streptococcus, Group A Screen (Direct): NEGATIVE

## 2014-09-17 MED ORDER — NAPROXEN 500 MG PO TABS: 500.0000 mg | ORAL_TABLET | Freq: Two times a day (BID) | ORAL | Status: DC

## 2014-09-17 NOTE — Discharge Instructions (Signed)
Cough, Adult  A cough is a reflex that helps clear your throat and airways. It can help heal the body or may be a reaction to an irritated airway. A cough may only last 2 or 3 weeks (acute) or may last more than 8 weeks (chronic).  CAUSES Acute cough:  Viral or bacterial infections. Chronic cough:  Infections.  Allergies.  Asthma.  Post-nasal drip.  Smoking.  Heartburn or acid reflux.  Some medicines.  Chronic lung problems (COPD).  Cancer. SYMPTOMS   Cough.  Fever.  Chest pain.  Increased breathing rate.  High-pitched whistling sound when breathing (wheezing).  Colored mucus that you cough up (sputum). TREATMENT   A bacterial cough may be treated with antibiotic medicine.  A viral cough must run its course and will not respond to antibiotics.  Your caregiver may recommend other treatments if you have a chronic cough. HOME CARE INSTRUCTIONS   Only take over-the-counter or prescription medicines for pain, discomfort, or fever as directed by your caregiver. Use cough suppressants only as directed by your caregiver.  Use a cold steam vaporizer or humidifier in your bedroom or home to help loosen secretions.  Sleep in a semi-upright position if your cough is worse at night.  Rest as needed.  Stop smoking if you smoke. SEEK IMMEDIATE MEDICAL CARE IF:   You have pus in your sputum.  Your cough starts to worsen.  You cannot control your cough with suppressants and are losing sleep.  You begin coughing up blood.  You have difficulty breathing.  You develop pain which is getting worse or is uncontrolled with medicine.  You have a fever. MAKE SURE YOU:   Understand these instructions.  Will watch your condition.  Will get help right away if you are not doing well or get worse. Document Released: 06/13/2011 Document Revised: 03/08/2012 Document Reviewed: 06/13/2011 Exit[employer] Patient Information 2015 [employer]. This information is not intended  to replace advice given to you by your health care provider. Make sure you discuss any questions you have with your health care provider.  

## 2014-09-17 NOTE — ED Notes (Signed)
MD at bedside. 

## 2014-09-17 NOTE — ED Provider Notes (Signed)
CSN: [id number]     Arrival date & time 09/17/14  0703 History   First MD Initiated Contact with Patient 09/17/14 [phone]     Chief Complaint  Patient presents with  . Shortness of Breath    HPI Pt was at work today when she started to feel dizzy and short of breath.  She also started feeling like she was having more difficulty concentrating.  She has had a mild non productive cough.  Feeling feverish and diaphoretic.  No vomiting or diarrhea.  No dysuria. Mild sore throat, no congestion.  Symptoms started yesterday but was worse today and had to leave to work.  Her hands are tingling and numb and they feel swollen.  Food taste horrible and she has not eaten much recently. She does have a sore throat and it hurts a little but to swallow.  Past Medical History  Diagnosis Date  . Asthma   . Environmental allergies   . Gestational diabetes    Past Surgical History  Procedure Laterality Date  . Abdominal hysterectomy     History reviewed. No pertinent family history. History  Substance Use Topics  . Smoking status: Never Smoker   . Smokeless tobacco: Never Used  . Alcohol Use: No   OB History   Grav Para Term Preterm Abortions TAB SAB Ect Mult Living                 Review of Systems  Respiratory: Positive for shortness of breath.   Cardiovascular: Negative for chest pain and leg swelling.       Some tightness   Gastrointestinal: Negative for vomiting.  Genitourinary: Negative for dysuria.  All other systems reviewed and are negative.     Allergies  Review of patient's allergies indicates no known allergies.  Home Medications   Prior to Admission medications   Medication Sig Start Date End Date Taking? Authorizing Provider  acetaminophen (TYLENOL) 325 MG tablet Take 650 mg by mouth every 6 (six) hours as needed for pain.   Yes Historical Provider, MD  albuterol (PROVENTIL HFA;VENTOLIN HFA) 108 (90 BASE) MCG/ACT inhaler Inhale 2 puffs into the lungs every 6 (six) hours as  needed for wheezing or shortness of breath.    Historical Provider, MD  Cetirizine HCl (ZYRTEC ALLERGY PO) Take 1 tablet by mouth at bedtime.     Historical Provider, MD  OVER THE COUNTER MEDICATION Take 1 tablet by mouth daily. Weight loss with Apple cider vinegar, kelp, lecithin and b-6    Historical Provider, MD  OVER THE COUNTER MEDICATION Take 1 tablet by mouth daily. Womens  essential vitamin    Historical Provider, MD  traMADol (ULTRAM) 50 MG tablet Take 1 tablet (50 mg total) by mouth every 6 (six) hours as needed. 02/26/14   Roxy Horseman, PA-C  traZODone (DESYREL) 50 MG tablet Take 50 mg by mouth at bedtime.    Historical Provider, MD   BP 135/86  Pulse 94  Temp(Src) 98.7 F (37.1 C)  Resp 16  Ht  (1.753 m)  Wt 244 lb (110.678 kg)  BMI 36.02 kg/m2  SpO2 96% Physical Exam  Nursing note and vitals reviewed. Constitutional: She appears well-developed and well-nourished. No distress.  Obese   HENT:  Head: Normocephalic and atraumatic.  Right Ear: External ear normal.  Left Ear: External ear normal.  Mouth/Throat: Posterior oropharyngeal erythema present. No oropharyngeal exudate.  Eyes: Conjunctivae are normal. Right eye exhibits no discharge. Left eye exhibits no discharge. No scleral icterus.  Neck: Neck supple. No tracheal deviation present.  Cardiovascular: Normal rate, regular rhythm and intact distal pulses.   Pulmonary/Chest: Effort normal and breath sounds normal. No stridor. No respiratory distress. She has no wheezes. She has no rales.  Abdominal: Soft. Bowel sounds are normal. She exhibits no distension. There is no tenderness. There is no rebound and no guarding.  Musculoskeletal: She exhibits no edema and no tenderness.  Neurological: She is alert. She has normal strength. No cranial nerve deficit (no facial droop, extraocular movements intact, no slurred speech) or sensory deficit. She exhibits normal muscle tone. She displays no seizure activity. Coordination  normal.  Skin: Skin is warm and dry. No rash noted.  Psychiatric: She has a normal mood and affect.    ED Course  Procedures (including critical care time) Labs Review Labs Reviewed  CBC WITH DIFFERENTIAL - Abnormal; Notable for the following:    WBC 3.4 (*)    Neutrophils Relative % 19 (*)    Neutro Abs 0.7 (*)    Lymphocytes Relative 65 (*)    Monocytes Relative 15 (*)    All other components within normal limits  BASIC METABOLIC PANEL - Abnormal; Notable for the following:    Glucose, Bld 158 (*)    GFR calc non Af Amer 77 (*)    GFR calc Af Amer 89 (*)    All other components within normal limits  RAPID STREP SCREEN  CULTURE, GROUP A STREP  CXR: nl   EKG Interpretation   Date/Time:  Sunday September 17 2014 07:37:33 EDT Ventricular Rate:  88 PR Interval:  140 QRS Duration: 92 QT Interval:  380 QTC Calculation: 459 R Axis:   91 Text Interpretation:  Normal sinus rhythm Rightward axis Borderline ECG No  significant change since last tracing Confirmed by Olinda Nola  MD-J, Ahmya Bernick  ([id number]) on 09/17/2014 7:37:05 AM      MDM   Final diagnoses:  URI, acute    Probable viral illness,  Uri.  Doubt PE, ACS or other emergency source for her symptoms.  DC home with supportive meds.  Follow up prn    Linwood Dibbles, MD 09/20/14 [phone]

## 2014-09-17 NOTE — ED Notes (Signed)
Pt c/o SOB with cough x 2 days ?

## 2014-09-17 NOTE — ED Notes (Signed)
Patient transported to X-ray 

## 2014-09-19 LAB — CULTURE, GROUP A STREP

## 2014-12-11 ENCOUNTER — Emergency Department: Payer: Self-pay | Admitting: Emergency Medicine

## 2014-12-28 IMAGING — CR DG CHEST 2V
2 series · 2 of 2 positions shown · non-contrast
Comparison: 03/25/2013.

CLINICAL DATA: Dizziness. Shortness of breath. Cough. History of
asthma.

EXAM:
CHEST  2 VIEW

[w chest pa]
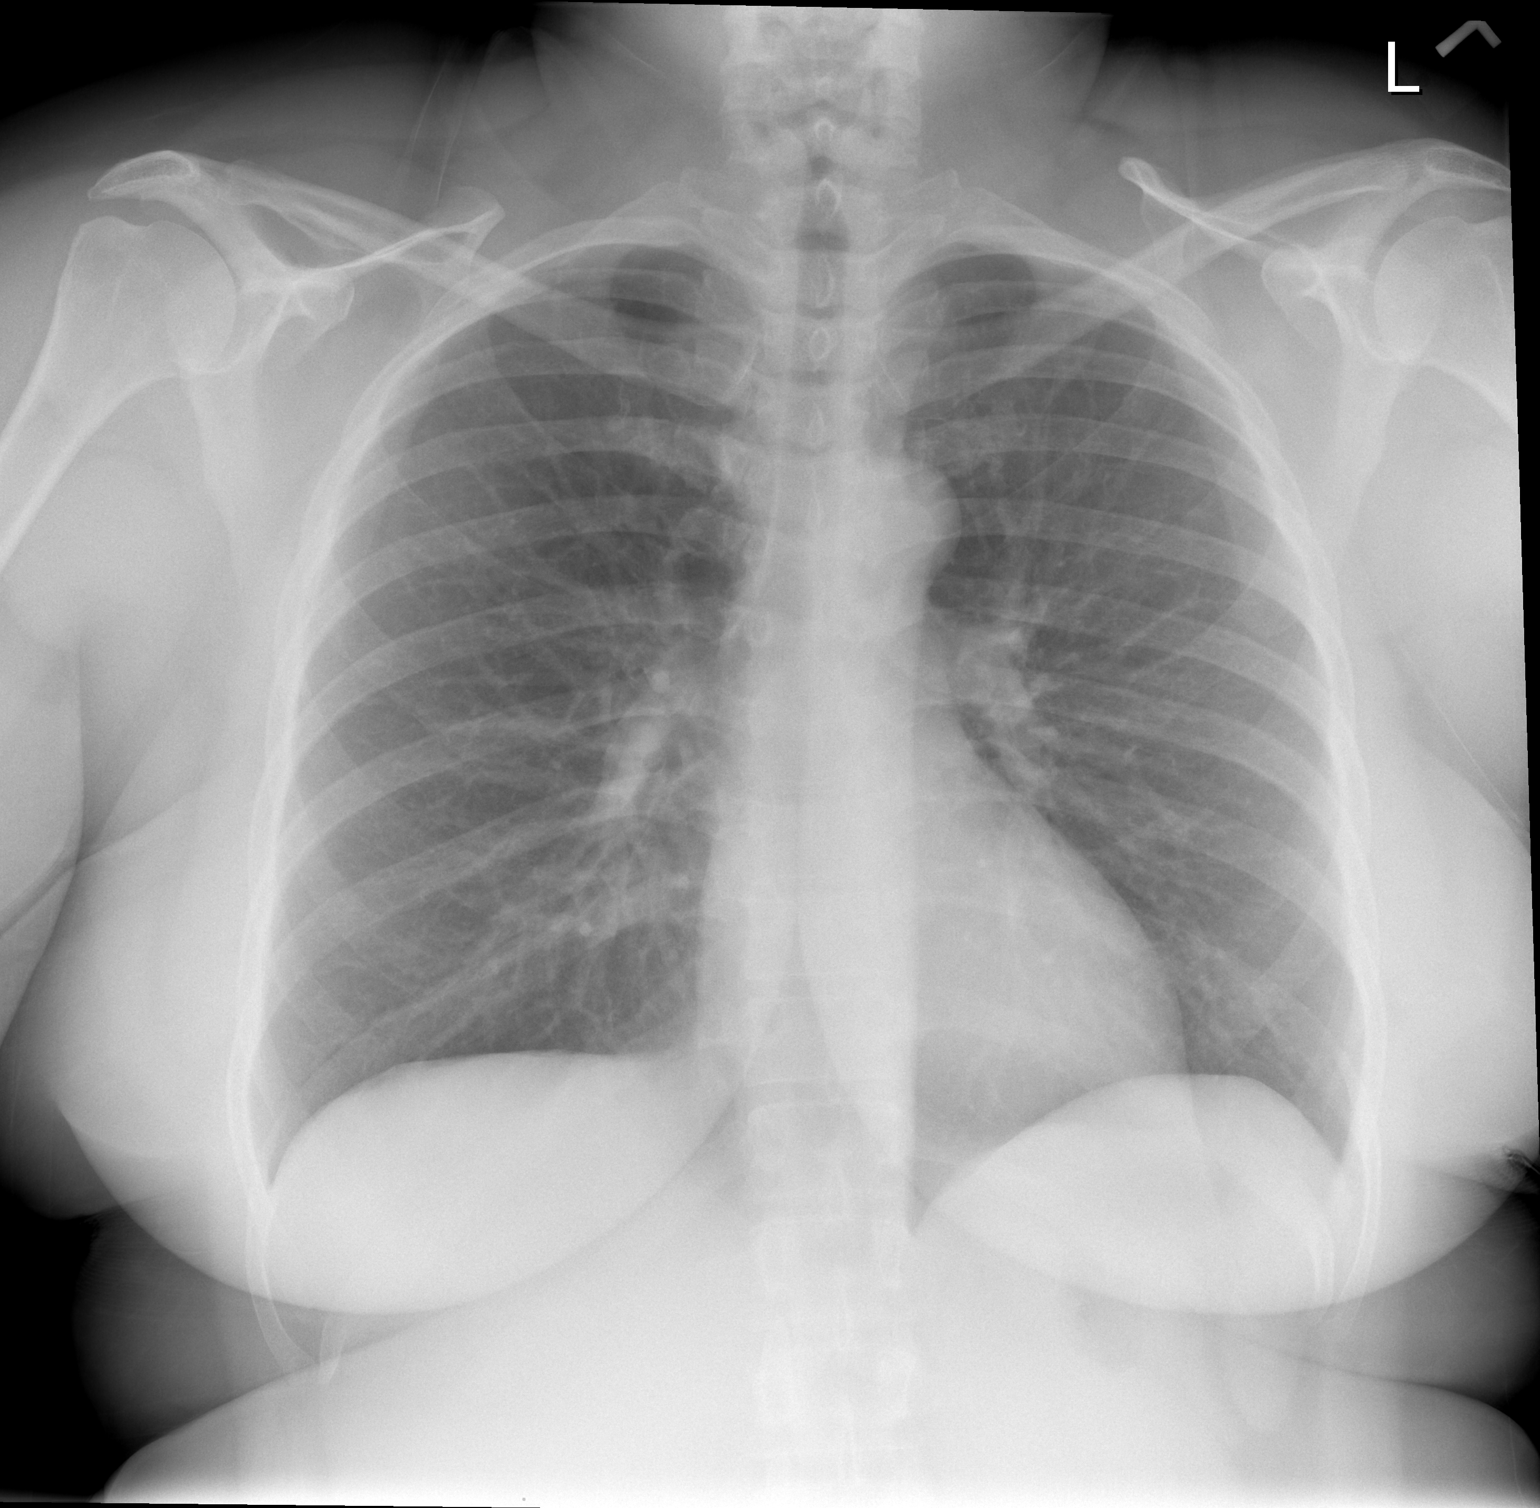

[w chest lat]
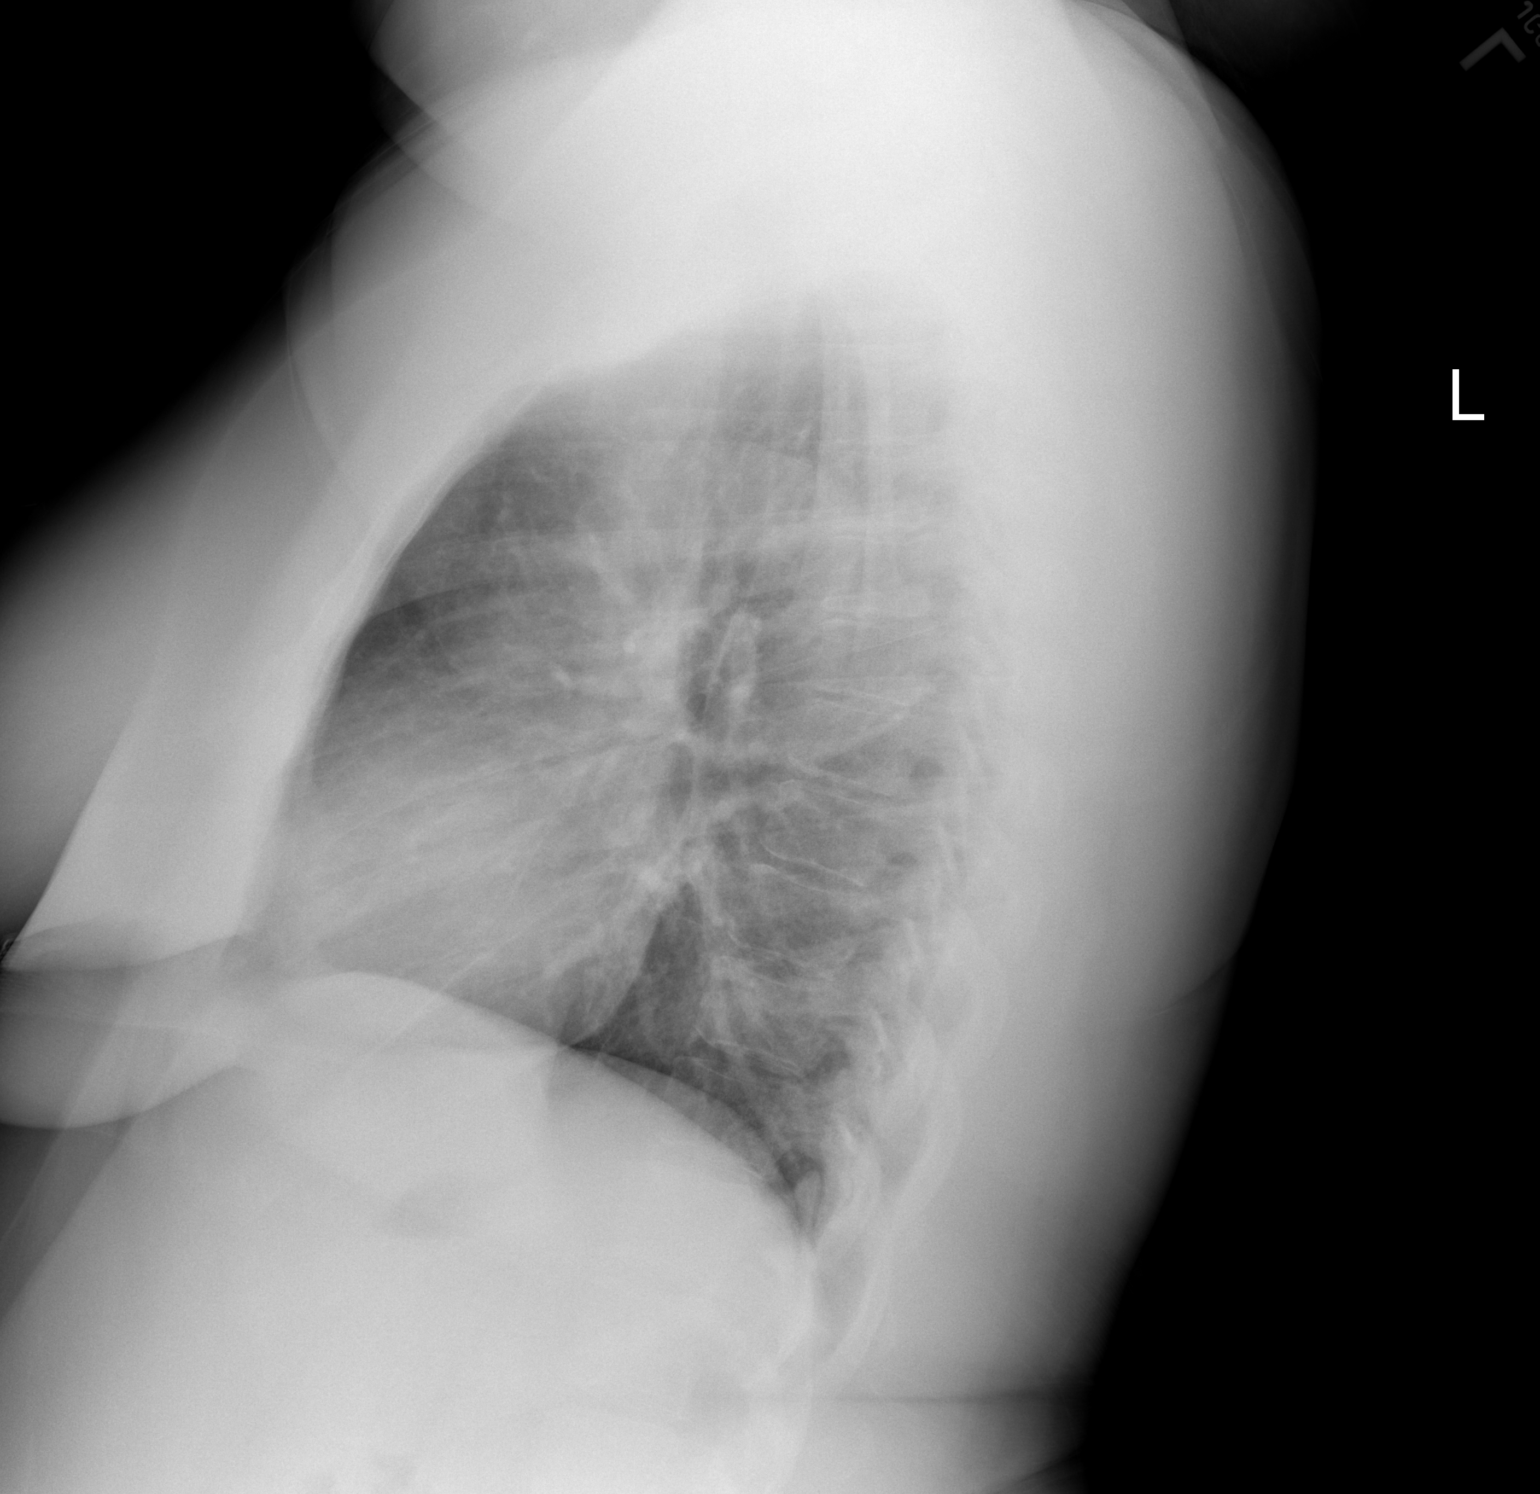

[2 of 2 positions shown; findings below may reference images not displayed]

FINDINGS: The heart size and mediastinal contours are within normal limits.
Both lungs are clear. The visualized skeletal structures are
unremarkable.
IMPRESSION: Normal examination.

## 2015-02-13 ENCOUNTER — Emergency Department: Payer: Self-pay | Admitting: Emergency Medicine

## 2015-03-13 ENCOUNTER — Emergency Department: Payer: Self-pay | Admitting: Emergency Medicine

## 2015-06-03 DIAGNOSIS — M2012 Hallux valgus (acquired), left foot: Secondary | ICD-10-CM

## 2015-06-23 IMAGING — CR DG FOOT COMPLETE 3+V*L*
1 series · 3 of 3 positions shown · non-contrast
Comparison: 04/18/2012

CLINICAL DATA: pt presents with painful left foot after she twisted
her ankle leaving work this evening. pt states there was a drop in
the concrete and just lost her balance. denies dizziness. swelling
noted to foot(area of 2dn, 3rd, and 4th MTP) +movement and
sensation. pt states h/o lateral fx to left foot

EXAM:
LEFT FOOT - COMPLETE 3+ VIEW

[Series 1: dxr foot lt comp w/obliques · 0.14mm/px · 3 of 3 slices shown]
[im 1/3]
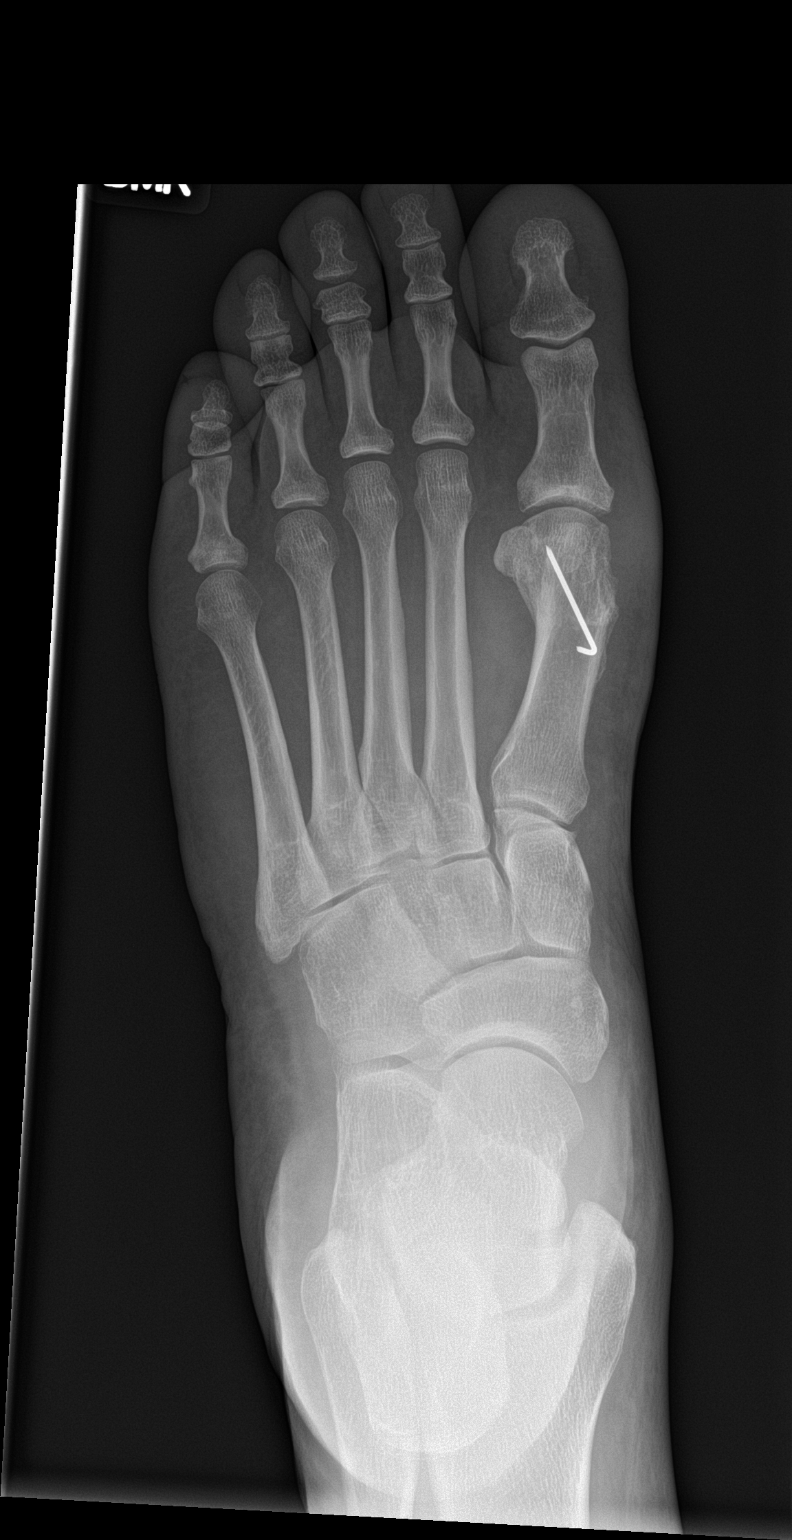
[im 2/3]
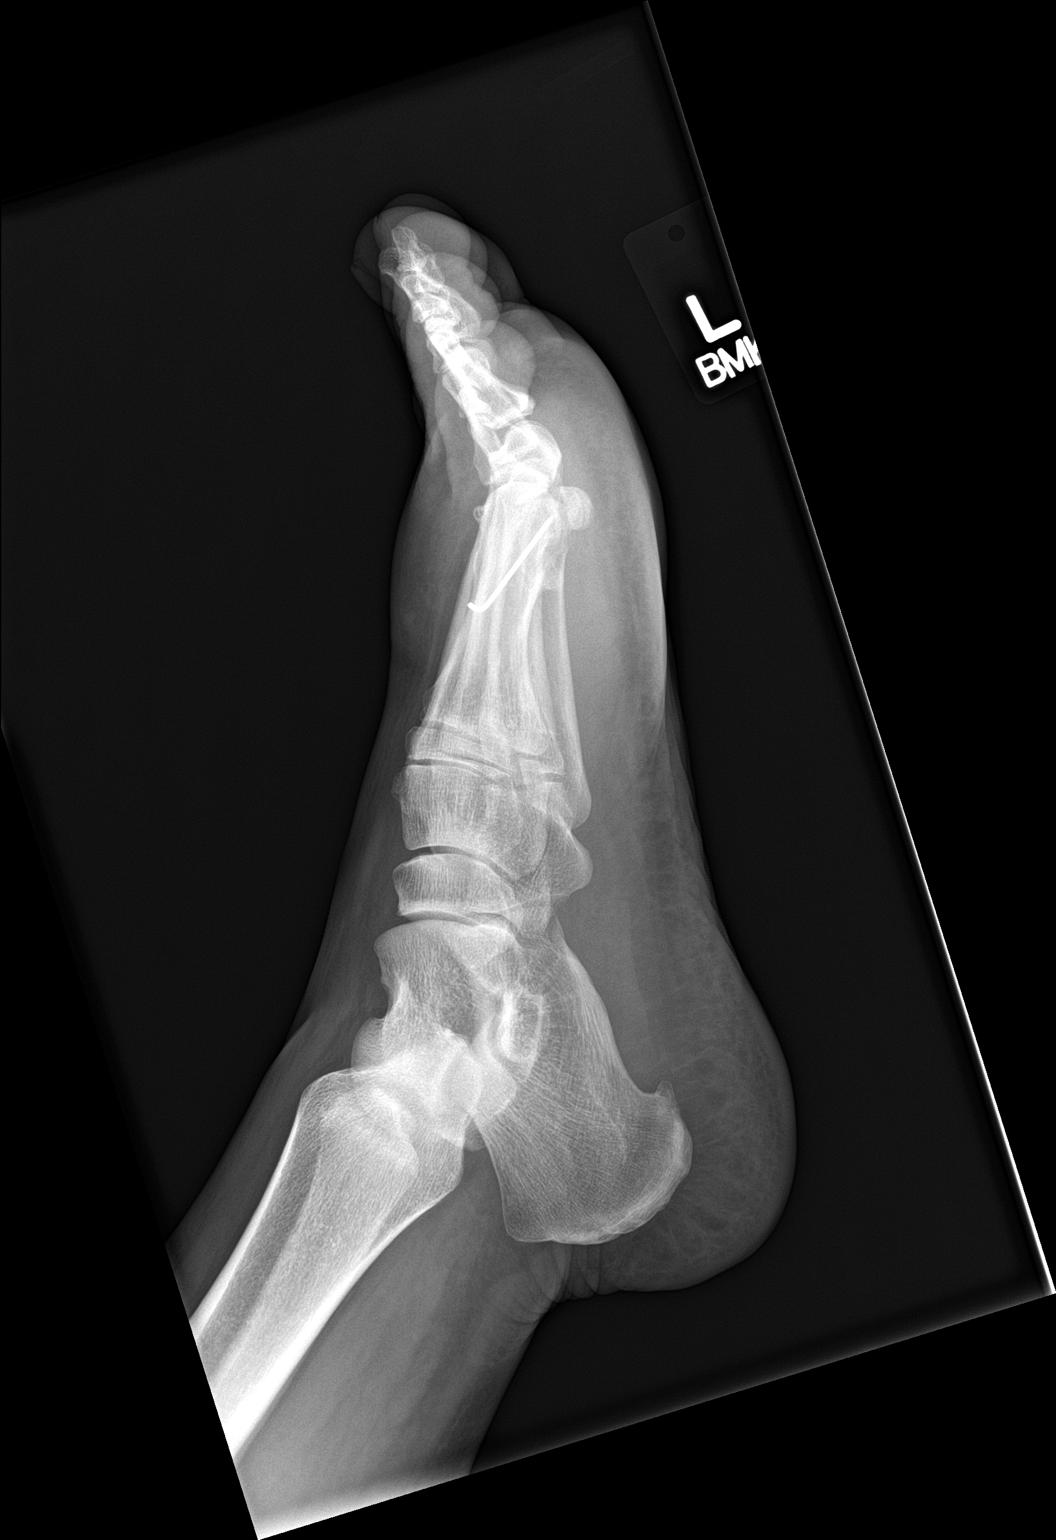
[im 3/3]
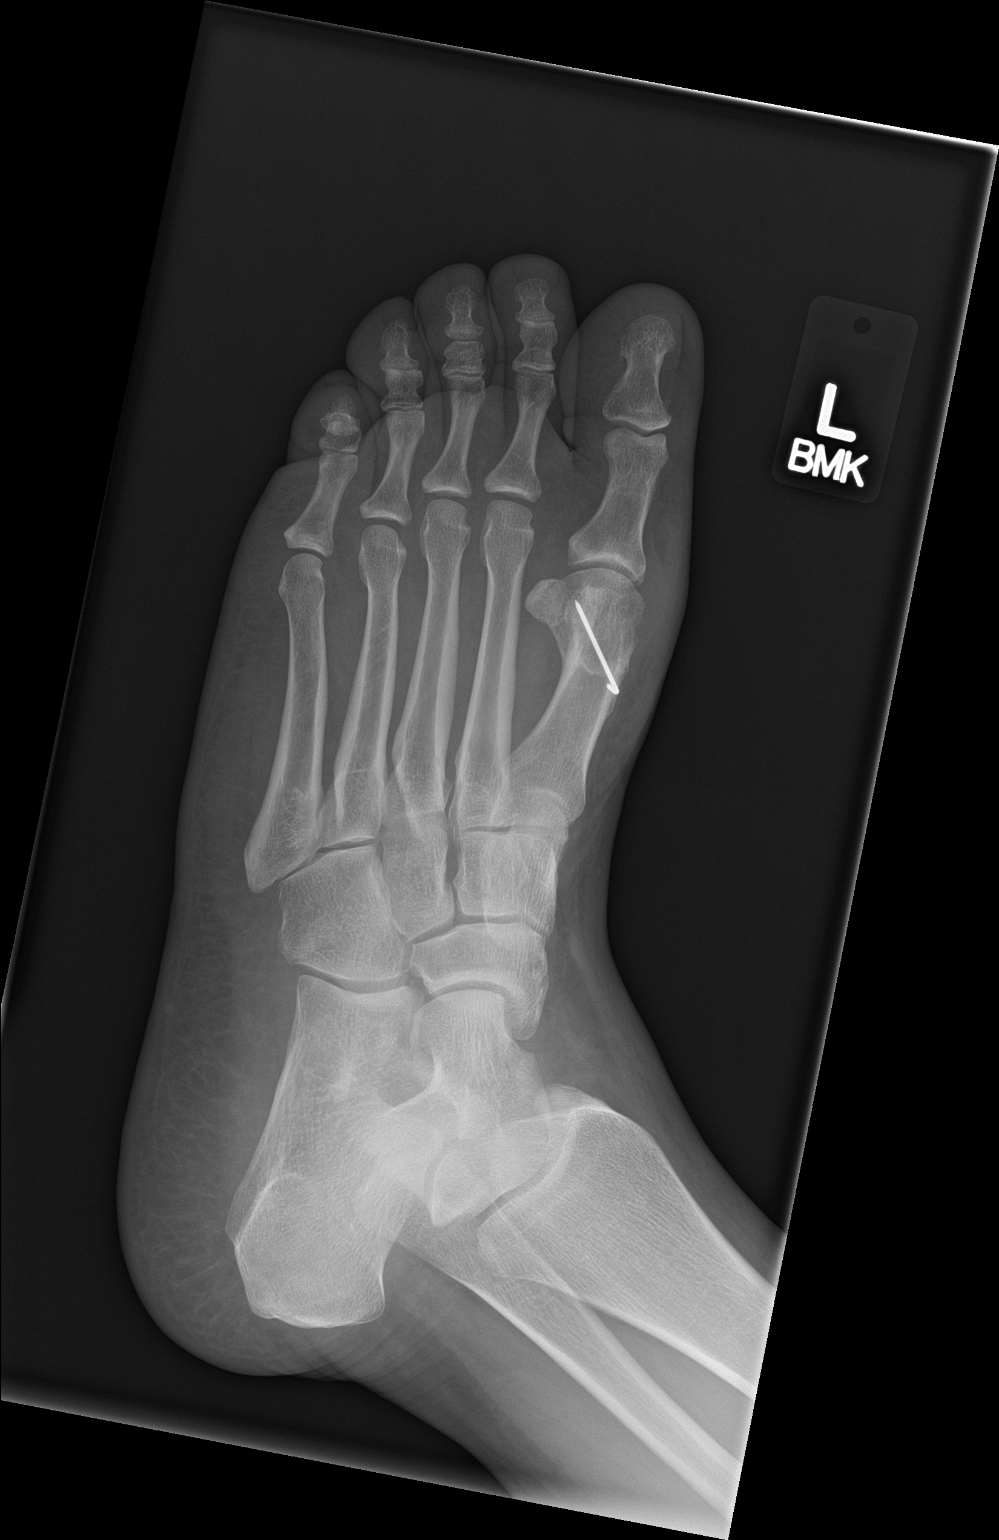

[3 of 3 positions shown; findings below may reference images not displayed]

FINDINGS: Exam demonstrates postsurgical change of the head of the first
metatarsal with hardware intact. There is no acute fracture or
dislocation. Small inferior calcaneal spur is present.
IMPRESSION: No acute fracture.

## 2015-10-28 ENCOUNTER — Emergency Department
Admission: EM | Admit: 2015-10-28 | Discharge: 2015-10-28 | Disposition: A | Discharge status: Home / Self Care | Payer: Medicaid Other | Attending: Emergency Medicine | Admitting: Emergency Medicine

## 2015-10-28 ENCOUNTER — Emergency Department: Payer: Medicaid Other

## 2015-10-28 DIAGNOSIS — J40 Bronchitis, not specified as acute or chronic: Secondary | ICD-10-CM

## 2015-10-28 DIAGNOSIS — Z79899 Other long term (current) drug therapy: Secondary | ICD-10-CM | POA: Insufficient documentation

## 2015-10-28 DIAGNOSIS — Z791 Long term (current) use of non-steroidal anti-inflammatories (NSAID): Secondary | ICD-10-CM | POA: Insufficient documentation

## 2015-10-28 DIAGNOSIS — J45901 Unspecified asthma with (acute) exacerbation: Secondary | ICD-10-CM | POA: Insufficient documentation

## 2015-10-28 MED ORDER — ALBUTEROL SULFATE HFA 108 (90 BASE) MCG/ACT IN AERS: 2.0000 | INHALATION_SPRAY | RESPIRATORY_TRACT | Status: DC | PRN

## 2015-10-28 MED ORDER — AZITHROMYCIN 250 MG PO TABS: ORAL_TABLET | ORAL | Status: DC

## 2015-10-28 MED ORDER — PREDNISONE 10 MG PO TABS: 10.0000 mg | ORAL_TABLET | ORAL | Status: DC

## 2015-10-28 NOTE — ED Notes (Addendum)
Pt reports nasal congestion, night sweats, sore throat and headache for past 2 days. Fever.  T max of 102 orally at home.

## 2015-10-28 NOTE — Discharge Instructions (Signed)

## 2015-10-28 NOTE — ED Provider Notes (Signed)
[facility] Emergency Department Provider Note  ____________________________________________  Time seen: Approximately 12:20 PM  I have reviewed the triage vital signs and the nursing notes.   HISTORY  Chief Complaint URI and Fever    HPI Crystal Gibson is a 44 y.o. female who presents to the emergency department complaining ofchest tightness, cough, mild nasal congestion, and fevers. She states that symptoms have been ongoing for the last 2-3 days. States that the highest temperature she has recorded is 102F. It's that her chest feels "tight" and that occasionally she feels a little "short of breath." She states that she has minimal coughing, but when she does she experiences a "gasping sensation." She denies any substernal chest pain. She denies any headache, visual acuity changes, neck pain, nausea, vomiting, or abdominal pain. She is taken over-the-counter medications with minimal relief. She denies any history of recent antibiotics use. She denies any history of asthma or COPD.   Past Medical History  Diagnosis Date  . Asthma   . Environmental allergies   . Gestational diabetes     There are no active problems to display for this patient.   Past Surgical History  Procedure Laterality Date  . Abdominal hysterectomy      Current Outpatient Rx  Name  Route  Sig  Dispense  Refill  . acetaminophen (TYLENOL) 325 MG tablet   Oral   Take 650 mg by mouth every 6 (six) hours as needed for pain.         Marland Kitchen. albuterol (PROVENTIL HFA;VENTOLIN HFA) 108 (90 BASE) MCG/ACT inhaler   Inhalation   Inhale 2 puffs into the lungs every 4 (four) hours as needed for wheezing or shortness of breath.   1 Inhaler   0   . azithromycin (ZITHROMAX Z-PAK) 250 MG tablet      Take 2 tablets (500 mg) on  Day 1,  followed by 1 tablet (250 mg) once daily on Days 2 through 5.   6 each   0   . Cetirizine HCl (ZYRTEC ALLERGY PO)   Oral   Take 1 tablet by mouth at bedtime.           . naproxen (NAPROSYN) 500 MG tablet   Oral   Take 1 tablet (500 mg total) by mouth 2 (two) times daily.   30 tablet   0   . OVER THE COUNTER MEDICATION   Oral   Take 1 tablet by mouth daily. Weight loss with Apple cider vinegar, kelp, lecithin and b-6         . OVER THE COUNTER MEDICATION   Oral   Take 1 tablet by mouth daily. Womens  essential vitamin         . predniSONE (DELTASONE) 10 MG tablet   Oral   Take 1 tablet (10 mg total) by mouth as directed.   21 tablet   0     Take on a daily basis of 6, 5, 4, 3, 2, 1   . traMADol (ULTRAM) 50 MG tablet   Oral   Take 1 tablet (50 mg total) by mouth every 6 (six) hours as needed.   15 tablet   0   . traZODone (DESYREL) 50 MG tablet   Oral   Take 50 mg by mouth at bedtime.           Allergies Review of patient's allergies indicates no known allergies.  No family history on file.  Social History Social History  Substance Use  Topics  . Smoking status: Never Smoker   . Smokeless tobacco: Never Used  . Alcohol Use: No    Review of Systems Constitutional: No fever/chills Eyes: No visual changes. ENT: No sore throat. Endorses mild nasal congestion. Cardiovascular: Denies chest pain. Respiratory: Endorses intermittent shortness of breath. Endorses mild cough. Endorses chest tightness. Gastrointestinal: No abdominal pain.  No nausea, no vomiting.  No diarrhea.  No constipation. Genitourinary: Negative for dysuria. Musculoskeletal: Negative for back pain. Skin: Negative for rash. Neurological: Negative for headaches, focal weakness or numbness.  10-point ROS otherwise negative.  ____________________________________________   PHYSICAL EXAM:  VITAL SIGNS: ED Triage Vitals  Enc Vitals Group     BP 10/28/15 1158 119/67 mmHg     Pulse Rate 10/28/15 1158 91     Resp 10/28/15 1158 20     Temp 10/28/15 1158 98.9 F (37.2 C)     Temp Source 10/28/15 1158 Oral     SpO2 10/28/15 1158 96 %      Weight 10/28/15 1158 240 lb (108.863 kg)     Height 10/28/15 1158  (1.753 m)     Head Cir --      Peak Flow --      Pain Score 10/28/15 1204 9     Pain Loc --      Pain Edu? --      Excl. in GC? --     Constitutional: Alert and oriented. Well appearing and in no acute distress. Eyes: Conjunctivae are normal. PERRL. EOMI. Head: Atraumatic. Nose: Mild clear congestion/rhinnorhea. Mouth/Throat: Mucous membranes are moist.  Oropharynx non-erythematous. Neck: No stridor.   Hematological/Lymphatic/Immunilogical: No cervical lymphadenopathy. Cardiovascular: Normal rate, regular rhythm. Grossly normal heart sounds.  Good peripheral circulation. Respiratory: Normal respiratory effort.  No retractions. Minimal diffuse bilateral wheezing in the bases. Gastrointestinal: Soft and nontender. No distention. No abdominal bruits. No CVA tenderness. Musculoskeletal: No lower extremity tenderness nor edema.  No joint effusions. Neurologic:  Normal speech and language. No gross focal neurologic deficits are appreciated. No gait instability. Skin:  Skin is warm, dry and intact. No rash noted. Psychiatric: Mood and affect are normal. Speech and behavior are normal.  ____________________________________________   LABS (all labs ordered are listed, but only abnormal results are displayed)  Labs Reviewed - No data to display ____________________________________________  EKG   ____________________________________________  RADIOLOGY  Chest x-ray Impression: ____________________________________________   PROCEDURES  Procedure(s) performed: None  Critical Care performed: No  ____________________________________________   INITIAL IMPRESSION / ASSESSMENT AND PLAN / ED COURSE  Pertinent labs & imaging results that were available during my care of the patient were reviewed by me and considered in my medical decision making (see chart for details).  Patient's history, symptoms, physical  exam, and radiological findings are consistent with a diagnosis of bronchitis. I advised the patient of findings and diagnosis and she verbalizes understanding of same. I'll provide the patient with steroids, albuterol, and Z-Pak for symptomatic relief. Patient verbalizes understanding of diagnosis and treatment plan and verbalizes compliance with same. ____________________________________________   FINAL CLINICAL IMPRESSION(S) / ED DIAGNOSES  Final diagnoses:  Bronchitis      Racheal Patches, PA-C 10/28/15 1324  Sharyn Creamer, MD 10/28/15 1600

## 2015-10-28 NOTE — ED Notes (Signed)
Pt brought in via triage complaining shortness of breath, chest fullness, congestion.  Pt states when she lies down she feels like she is "drowning." Symptoms began on Wednesday; pt reports having a fever of 102 on Wednesday and Thursday; pt taking OTC meds which have helped with fever but not the other symtoms.  Pt in no respiratory distress at this time.

## 2016-02-27 ENCOUNTER — Emergency Department
Admission: EM | Admit: 2016-02-27 | Discharge: 2016-02-27 | Disposition: A | Discharge status: Home / Self Care | Payer: No Typology Code available for payment source | Attending: Emergency Medicine | Admitting: Emergency Medicine

## 2016-02-27 ENCOUNTER — Encounter: Payer: Self-pay | Admitting: Emergency Medicine

## 2016-02-27 DIAGNOSIS — J029 Acute pharyngitis, unspecified: Secondary | ICD-10-CM | POA: Insufficient documentation

## 2016-02-27 DIAGNOSIS — Z79899 Other long term (current) drug therapy: Secondary | ICD-10-CM | POA: Insufficient documentation

## 2016-02-27 DIAGNOSIS — J45909 Unspecified asthma, uncomplicated: Secondary | ICD-10-CM | POA: Insufficient documentation

## 2016-02-27 DIAGNOSIS — R0982 Postnasal drip: Secondary | ICD-10-CM

## 2016-02-27 DIAGNOSIS — Z7952 Long term (current) use of systemic steroids: Secondary | ICD-10-CM | POA: Insufficient documentation

## 2016-02-27 DIAGNOSIS — Z791 Long term (current) use of non-steroidal anti-inflammatories (NSAID): Secondary | ICD-10-CM | POA: Insufficient documentation

## 2016-02-27 LAB — RAPID INFLUENZA A&B ANTIGENS (ARMC ONLY): INFLUENZA A (ARMC): NEGATIVE

## 2016-02-27 LAB — POCT RAPID STREP A: Streptococcus, Group A Screen (Direct): NEGATIVE

## 2016-02-27 LAB — RAPID INFLUENZA A&B ANTIGENS: Influenza B (ARMC): NEGATIVE

## 2016-02-27 MED ORDER — ACETAMINOPHEN-CODEINE #3 300-30 MG PO TABS: 1.0000 | ORAL_TABLET | Freq: Four times a day (QID) | ORAL | Status: DC | PRN

## 2016-02-27 NOTE — ED Notes (Signed)
Pt signed but signature pad not working. 

## 2016-02-27 NOTE — ED Notes (Signed)
Sore throat x 4 days, redness noted to oral pharynx.  No pus.  Denies fever or chills, states difficulty with eating and drinking, sometimes she feels like she cannot breath as well.  Currently spo2 is 100%.  Posterior lung fields clear, normal work of breathing.  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

## 2016-02-27 NOTE — ED Provider Notes (Signed)
[facility] Emergency Department Provider Note ____________________________________________  Time seen: 15:00  I have reviewed the triage vital signs and the nursing notes.  HISTORY  Chief Complaint  Sore Throat  HPI Crystal Gibson is a 45 y.o. female who presents with sore throat since early Tuesday. The patient describes her pain as constant sharp, burning, and pressure. She has dysphagia and odynophagia. Her symptoms are worse with lying down and eating/swallowing. She had minimal improvement with TheraFlu and lozenges. She has associated headache, photophobia, and earache. The patient denies fever, chills, cough, nausea, vomiting, rhinorrhea, and abdominal pain. She ranks the severity of her symptoms to be a 6/10. The patient has received her seasonal flu vaccination.   Past Medical History  Diagnosis Date  . Asthma   . Environmental allergies   . Gestational diabetes     There are no active problems to display for this patient.   Past Surgical History  Procedure Laterality Date  . Abdominal hysterectomy      Current Outpatient Rx  Name  Route  Sig  Dispense  Refill  . acetaminophen (TYLENOL) 325 MG tablet   Oral   Take 650 mg by mouth every 6 (six) hours as needed for pain.         Marland Kitchen acetaminophen-codeine (TYLENOL #3) 300-30 MG tablet   Oral   Take 1 tablet by mouth every 6 (six) hours as needed for moderate pain.   10 tablet   0   . albuterol (PROVENTIL HFA;VENTOLIN HFA) 108 (90 BASE) MCG/ACT inhaler   Inhalation   Inhale 2 puffs into the lungs every 4 (four) hours as needed for wheezing or shortness of breath.   1 Inhaler   0   . azithromycin (ZITHROMAX Z-PAK) 250 MG tablet      Take 2 tablets (500 mg) on  Day 1,  followed by 1 tablet (250 mg) once daily on Days 2 through 5.   6 each   0   . Cetirizine HCl (ZYRTEC ALLERGY PO)   Oral   Take 1 tablet by mouth at bedtime.          . naproxen (NAPROSYN) 500 MG tablet   Oral    Take 1 tablet (500 mg total) by mouth 2 (two) times daily.   30 tablet   0   . OVER THE COUNTER MEDICATION   Oral   Take 1 tablet by mouth daily. Weight loss with Apple cider vinegar, kelp, lecithin and b-6         . OVER THE COUNTER MEDICATION   Oral   Take 1 tablet by mouth daily. Womens  essential vitamin         . predniSONE (DELTASONE) 10 MG tablet   Oral   Take 1 tablet (10 mg total) by mouth as directed.   21 tablet   0     Take on a daily basis of 6, 5, 4, 3, 2, 1   . traMADol (ULTRAM) 50 MG tablet   Oral   Take 1 tablet (50 mg total) by mouth every 6 (six) hours as needed.   15 tablet   0   . traZODone (DESYREL) 50 MG tablet   Oral   Take 50 mg by mouth at bedtime.           Allergies Review of patient's allergies indicates no known allergies.  No family history on file.  Social History Social History  Substance Use Topics  . Smoking status:  Never Smoker   . Smokeless tobacco: Never Used  . Alcohol Use: No    Review of Systems  Constitutional: Negative for fever. Eyes: Negative for visual changes. ENT: Positive for sore throat. Cardiovascular: Negative for chest pain. Respiratory: Negative for shortness of breath. Gastrointestinal: Negative for abdominal pain, vomiting and diarrhea. Genitourinary: Negative for dysuria. Skin: Negative for rash. Neurological: Positive for headaches, Negative for focal weakness or numbness. ____________________________________________  PHYSICAL EXAM:  VITAL SIGNS: ED Triage Vitals  Enc Vitals Group     BP 02/27/16 1314 129/52 mmHg     Pulse Rate 02/27/16 1314 80     Resp 02/27/16 1314 18     Temp 02/27/16 1314 98.7 F (37.1 C)     Temp Source 02/27/16 1314 Oral     SpO2 02/27/16 1314 100 %     Weight 02/27/16 1314 240 lb (108.863 kg)     Height 02/27/16 1314  (1.753 m)     Head Cir --      Peak Flow --      Pain Score 02/27/16 1340 6     Pain Loc --      Pain Edu? --      Excl. in GC? --     Constitutional: Alert and oriented. Well appearing and in no distress. Head: Normocephalic and atraumatic.      Eyes: Conjunctivae are normal. PERRL. Normal extraocular movements      Ears: Canals clear. TMs intact bilaterally.   Nose: No congestion/rhinorrhea.   Mouth/Throat: Mucous membranes are moist. Mild erythema of the oropharynx. Uvula is midline and tonsils are flat. No tonsillar erythema, exudates, or edema.    Neck: Supple. No thyromegaly. Hematological/Lymphatic/Immunological: No cervical lymphadenopathy. Cardiovascular: Normal rate, regular rhythm.  Respiratory: Normal respiratory effort. No wheezes/rales/rhonchi. Gastrointestinal: Soft and nontender. No distention. Neurologic:  Normal gait without ataxia. Normal speech and language. No gross focal neurologic deficits are appreciated. Skin:  Skin is warm, dry and intact. No rash noted. Psychiatric: Mood and affect are normal. Patient exhibits appropriate insight and judgment. ____________________________________________   LABS (pertinent positives/negatives) Labs Reviewed  RAPID INFLUENZA A&B ANTIGENS ([facility] ONLY)  CULTURE, GROUP A STREP [facility])  POCT RAPID STREP A  ____________________________________________  INITIAL IMPRESSION / ASSESSMENT AND PLAN / ED COURSE  Patient with symptoms consistent with an upper history infection and sore throat last few due to postnasal drainage. There is no clinical or laboratory indication of an acute strep pharyngitis on presentation. Patient is advised to continue dosing her daily allergy medicine plus decongestant. She is also given instruction on Magic mouthwash solution. She'll be discharged with a prescription for Tylenol 3 to dose as needed for acute pain and headache pain relief. She will continue to monitor and treat any fevers and follow with the primary care provider as needed. ____________________________________________  FINAL CLINICAL IMPRESSION(S) / ED  DIAGNOSES  Final diagnoses:  Sore throat  Post-nasal drip     Lissa Hoard, PA-C 02/27/16 1915  Arnaldo Natal, MD 02/27/16 2034

## 2016-02-27 NOTE — Discharge Instructions (Signed)
Sore Throat A sore throat is pain, burning, irritation, or scratchiness of the throat. There is often pain or tenderness when swallowing or talking. A sore throat may be accompanied by other symptoms, such as coughing, sneezing, fever, and swollen neck glands. A sore throat is often the first sign of another sickness, such as a cold, flu, strep throat, or mononucleosis (commonly known as mono). Most sore throats go away without medical treatment. CAUSES  The most common causes of a sore throat include:  A viral infection, such as a cold, flu, or mono.  A bacterial infection, such as strep throat, tonsillitis, or whooping cough.  Seasonal allergies.  Dryness in the air.  Irritants, such as smoke or pollution.  Gastroesophageal reflux disease (GERD). HOME CARE INSTRUCTIONS   Only take over-the-counter medicines as directed by your caregiver.  Drink enough fluids to keep your urine clear or pale yellow.  Rest as needed.  Try using throat sprays, lozenges, or sucking on hard candy to ease any pain (if older than 4 years or as directed).  Sip warm liquids, such as broth, herbal tea, or warm water with honey to relieve pain temporarily. You may also eat or drink cold or frozen liquids such as frozen ice pops.  Gargle with salt water (mix 1 tsp salt with 8 oz of water).  Do not smoke and avoid secondhand smoke.  Put a cool-mist humidifier in your bedroom at night to moisten the air. You can also turn on a hot shower and sit in the bathroom with the door closed for 5-10 minutes. SEEK IMMEDIATE MEDICAL CARE IF:  You have difficulty breathing.  You are unable to swallow fluids, soft foods, or your saliva.  You have increased swelling in the throat.  Your sore throat does not get better in 7 days.  You have nausea and vomiting.  You have a fever or persistent symptoms for more than 2-3 days.  You have a fever and your symptoms suddenly get worse. MAKE SURE YOU:   Understand  these instructions.  Will watch your condition.  Will get help right away if you are not doing well or get worse.   This information is not intended to replace advice given to you by your health care provider. Make sure you discuss any questions you have with your health care provider.   Document Released: 01/22/2005 Document Revised: 01/05/2015 Document Reviewed: 08/22/2012 Elsevier Interactive Patient Education 2016 [employer].   Take Tylenol as prescribed. Swish and gargle equal parts of Benadryl plus Maalox or Mylanta. Continue daily allergy medications for symptom management. You may receive a call if your throat culture is positive.

## 2016-02-29 LAB — CULTURE, GROUP A STREP (THRC)

## 2016-03-14 ENCOUNTER — Encounter: Payer: Self-pay | Admitting: Emergency Medicine

## 2016-03-14 ENCOUNTER — Emergency Department: Payer: Medicaid Other

## 2016-03-14 ENCOUNTER — Emergency Department
Admission: EM | Admit: 2016-03-14 | Discharge: 2016-03-14 | Disposition: A | Discharge status: Home / Self Care | Payer: Medicaid Other | Attending: Emergency Medicine | Admitting: Emergency Medicine

## 2016-03-14 DIAGNOSIS — Y92410 Unspecified street and highway as the place of occurrence of the external cause: Secondary | ICD-10-CM | POA: Diagnosis not present

## 2016-03-14 DIAGNOSIS — S199XXA Unspecified injury of neck, initial encounter: Secondary | ICD-10-CM | POA: Diagnosis present

## 2016-03-14 DIAGNOSIS — Z79899 Other long term (current) drug therapy: Secondary | ICD-10-CM | POA: Insufficient documentation

## 2016-03-14 DIAGNOSIS — M25532 Pain in left wrist: Secondary | ICD-10-CM

## 2016-03-14 DIAGNOSIS — Y998 Other external cause status: Secondary | ICD-10-CM | POA: Diagnosis not present

## 2016-03-14 DIAGNOSIS — S6992XA Unspecified injury of left wrist, hand and finger(s), initial encounter: Secondary | ICD-10-CM | POA: Diagnosis not present

## 2016-03-14 DIAGNOSIS — S161XXA Strain of muscle, fascia and tendon at neck level, initial encounter: Secondary | ICD-10-CM | POA: Diagnosis not present

## 2016-03-14 DIAGNOSIS — S0990XA Unspecified injury of head, initial encounter: Secondary | ICD-10-CM | POA: Diagnosis not present

## 2016-03-14 DIAGNOSIS — Y9389 Activity, other specified: Secondary | ICD-10-CM | POA: Insufficient documentation

## 2016-03-14 MED ORDER — DIAZEPAM 2 MG PO TABS: 2.0000 mg | ORAL_TABLET | Freq: Three times a day (TID) | ORAL | Status: DC | PRN

## 2016-03-14 MED ORDER — ACETAMINOPHEN 500 MG PO TABS
1000.0000 mg | ORAL_TABLET | Freq: Once | ORAL | Status: AC
  Administered 2016-03-14: 1000 mg via ORAL
  Filled 2016-03-14: qty 2

## 2016-03-14 MED ORDER — HYDROCODONE-ACETAMINOPHEN 5-325 MG PO TABS: 1.0000 | ORAL_TABLET | ORAL | Status: DC | PRN

## 2016-03-14 MED ORDER — DIAZEPAM 2 MG PO TABS
2.0000 mg | ORAL_TABLET | Freq: Once | ORAL | Status: AC
  Administered 2016-03-14: 2 mg via ORAL
  Filled 2016-03-14: qty 1
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
## 2016-04-03 ENCOUNTER — Ambulatory Visit: Payer: Medicaid Other | Admitting: Podiatry

## 2016-04-17 DIAGNOSIS — S93629A Sprain of tarsometatarsal ligament of unspecified foot, initial encounter: Secondary | ICD-10-CM | POA: Insufficient documentation

## 2016-09-03 ENCOUNTER — Emergency Department
Admission: EM | Admit: 2016-09-03 | Discharge: 2016-09-03 | Disposition: A | Discharge status: Home / Self Care | Payer: Medicaid Other | Attending: Student in an Organized Health Care Education/Training Program | Admitting: Student in an Organized Health Care Education/Training Program

## 2016-09-03 DIAGNOSIS — N3000 Acute cystitis without hematuria: Secondary | ICD-10-CM | POA: Insufficient documentation

## 2016-09-03 DIAGNOSIS — Z79899 Other long term (current) drug therapy: Secondary | ICD-10-CM | POA: Insufficient documentation

## 2016-09-03 DIAGNOSIS — R300 Dysuria: Secondary | ICD-10-CM

## 2016-09-03 DIAGNOSIS — J45909 Unspecified asthma, uncomplicated: Secondary | ICD-10-CM | POA: Insufficient documentation

## 2016-09-03 LAB — CHLAMYDIA/NGC RT PCR (ARMC ONLY)
Chlamydia Tr: NOT DETECTED
N GONORRHOEAE: NOT DETECTED

## 2016-09-03 LAB — URINALYSIS COMPLETE WITH MICROSCOPIC (ARMC ONLY)
BACTERIA UA: NONE SEEN
Bilirubin Urine: NEGATIVE
Glucose, UA: >500 mg/dL — AB
HGB URINE DIPSTICK: NEGATIVE
Ketones, ur: NEGATIVE mg/dL
LEUKOCYTES UA: NEGATIVE
Nitrite: NEGATIVE
PH: 5 (ref 5.0–8.0)
PROTEIN: 30 mg/dL — AB
Specific Gravity, Urine: 1.035 — ABNORMAL HIGH (ref 1.005–1.030)

## 2016-09-03 LAB — GLUCOSE, CAPILLARY: GLUCOSE-CAPILLARY: 288 mg/dL — AB (ref 65–99)

## 2016-09-03 LAB — WET PREP, GENITAL
Clue Cells Wet Prep HPF POC: NONE SEEN
Sperm: NONE SEEN
TRICH WET PREP: NONE SEEN
WBC, Wet Prep HPF POC: NONE SEEN
YEAST WET PREP: NONE SEEN

## 2016-09-03 LAB — PREGNANCY, URINE: PREG TEST UR: NEGATIVE

## 2016-09-03 MED ORDER — PHENAZOPYRIDINE HCL 95 MG PO TABS: 95.0000 mg | ORAL_TABLET | Freq: Three times a day (TID) | ORAL | 0 refills | Status: DC | PRN

## 2016-09-03 MED ORDER — NAPROXEN 500 MG PO TABS: 500.0000 mg | ORAL_TABLET | Freq: Two times a day (BID) | ORAL | 0 refills | Status: DC

## 2016-09-03 NOTE — ED Triage Notes (Signed)
Pt here for possible UTI. Reports burning and pain with urination

## 2016-09-03 NOTE — ED Notes (Signed)
Pt. States painful urination for the past couple days.  Pt. States  Increased vaginal swelling.  Pt. Denies chronic UTI.  Pt. Denies change in medication.  Pt. Denies diabetes.  Pt. States she just ate a large meal.

## 2016-09-03 NOTE — ED Notes (Signed)
AAOx3.  Skin warm and dry.  NAD 

## 2016-09-03 NOTE — ED Provider Notes (Signed)
[facility] Emergency Department Provider Note  ____________________________________________  Time seen: Approximately 8:10 PM  I have reviewed the triage vital signs and the nursing notes.   HISTORY  Chief Complaint Urinary Tract Infection    HPI Crystal Gibson is a 45 y.o. female resents emergency department complaining of dysuria, polyuria, vaginal irritation. Patient states that symptoms have been ongoing for 7-10 days. Patient has had a history of UTIs but last one was 15 years prior. Patient states that she has some lower pelvic discomfort, dysuria, polyuria, vaginal pain. She denies any vaginal discharge. She denies any vaginal bleeding. No constipation or diarrhea. No fevers or chills. No nausea or vomiting.  Patient returned with significant glucose in her urine sample. [facility] emergency Department reveals a blood sugar of 288. Patient states that shehad eaten a meal plus had a milkshake directly prior to fingerstick. She reports that she has had glucose in her urine on all of her urinalysis. This is been evaluated by her primary care provider multiple times and states that her A1c's are always normal.   Past Medical History:  Diagnosis Date  . Asthma   . Environmental allergies   . Gestational diabetes     There are no active problems to display for this patient.   Past Surgical History:  Procedure Laterality Date  . ABDOMINAL HYSTERECTOMY      Prior to Admission medications   Medication Sig Start Date End Date Taking? Authorizing Provider  acetaminophen (TYLENOL) 325 MG tablet Take 650 mg by mouth every 6 (six) hours as needed for pain.    Historical Provider, MD  albuterol (PROVENTIL HFA;VENTOLIN HFA) 108 (90 BASE) MCG/ACT inhaler Inhale 2 puffs into the lungs every 4 (four) hours as needed for wheezing or shortness of breath. 10/28/15   Delorise Royals Mylen Mangan, PA-C  Cetirizine HCl (ZYRTEC ALLERGY PO) Take 1 tablet by mouth at bedtime.      Historical Provider, MD  diazepam (VALIUM) 2 MG tablet Take 1 tablet (2 mg total) by mouth every 8 (eight) hours as needed for muscle spasms. 03/14/16   Tommi Rumps, PA-C  HYDROcodone-acetaminophen (NORCO/VICODIN) 5-325 MG tablet Take 1 tablet by mouth every 4 (four) hours as needed for moderate pain. 03/14/16   Tommi Rumps, PA-C  naproxen (NAPROSYN) 500 MG tablet Take 1 tablet (500 mg total) by mouth 2 (two) times daily with a meal. 09/03/16   Delorise Royals Champion Corales, PA-C  OVER THE COUNTER MEDICATION Take 1 tablet by mouth daily. Weight loss with Apple cider vinegar, kelp, lecithin and b-6    Historical Provider, MD  OVER THE COUNTER MEDICATION Take 1 tablet by mouth daily. Womens  essential vitamin    Historical Provider, MD  phenazopyridine (PYRIDIUM) 95 MG tablet Take 1 tablet (95 mg total) by mouth 3 (three) times daily as needed for pain. 09/03/16   Delorise Royals Canyon Lohr, PA-C  traMADol (ULTRAM) 50 MG tablet Take 1 tablet (50 mg total) by mouth every 6 (six) hours as needed. 02/26/14   Roxy Horseman, PA-C  traZODone (DESYREL) 50 MG tablet Take 50 mg by mouth at bedtime.    Historical Provider, MD    Allergies Review of patient's allergies indicates no known allergies.  No family history on file.  Social History Social History  Substance Use Topics  . Smoking status: Never Smoker  . Smokeless tobacco: Never Used  . Alcohol use No     Review of Systems  Constitutional: No fever/chills Cardiovascular: no chest pain.  Respiratory: no cough. No SOB. Gastrointestinal: Positive for suprapubic pain.  No nausea, no vomiting.  No diarrhea.  No constipation. Genitourinary: Positive for dysuria and polyuria.. No hematuria. Positive for vaginal irritation. No vaginal discharge or bleeding. Musculoskeletal: Negative for musculoskeletal pain. Skin: Negative for rash, abrasions, lacerations, ecchymosis. Neurological: Negative for headaches, focal weakness or numbness. 10-point ROS otherwise  negative.  ____________________________________________   PHYSICAL EXAM:  VITAL SIGNS: ED Triage Vitals  Enc Vitals Group     BP 09/03/16 1802 118/70     Pulse Rate 09/03/16 1802 (!) 105     Resp 09/03/16 1802 18     Temp 09/03/16 1802 99.1 F (37.3 C)     Temp Source 09/03/16 1802 Oral     SpO2 09/03/16 1802 97 %     Weight 09/03/16 1803 235 lb (106.6 kg)     Height 09/03/16 1803 5\' 9"  (1.753 m)     Head Circumference --      Peak Flow --      Pain Score 09/03/16 1816 5     Pain Loc --      Pain Edu? --      Excl. in GC? --      Constitutional: Alert and oriented. Well appearing and in no acute distress. Eyes: Conjunctivae are normal. PERRL. EOMI. Head: Atraumatic. Cardiovascular: Normal rate, regular rhythm. Normal S1 and S2.  Good peripheral circulation. Respiratory: Normal respiratory effort without tachypnea or retractions. Lungs CTAB. Good air entry to the bases with no decreased or absent breath sounds. Gastrointestinal: Bowel sounds 4 quadrants. Patient is mildly tender to palpation in the suprapubic region.. No guarding or rigidity. No palpable masses. No distention. No CVA tenderness. Genitourinary: External genitalia with no lesions, chancres, physical discharge. Exam of the speculum reveals no distinct discharge, abrasions or lacerations the vaginal wall, edema or erythema of the cervix. Bimanual exam reveals no cervical motion tenderness. No palpable abnormalities of the uterus or adnexa Musculoskeletal: Full range of motion to all extremities. No gross deformities appreciated. Neurologic:  Normal speech and language. No gross focal neurologic deficits are appreciated.  Skin:  Skin is warm, dry and intact. No rash noted. Psychiatric: Mood and affect are normal. Speech and behavior are normal. Patient exhibits appropriate insight and judgement.  Pelvic exam chaperoned by ED tech, Wende Neighbors ____________________________________________   LABS (all labs ordered  are listed, but only abnormal results are displayed)  Labs Reviewed  URINALYSIS COMPLETEWITH MICROSCOPIC ([facility] ONLY) - Abnormal; Notable for the following:       Result Value   Color, Urine YELLOW (*)    APPearance CLEAR (*)    Glucose, UA >500 (*)    Specific Gravity, Urine 1.035 (*)    Protein, ur 30 (*)    Squamous Epithelial / LPF 0-5 (*)    All other components within normal limits  GLUCOSE, CAPILLARY - Abnormal; Notable for the following:    Glucose-Capillary 288 (*)    All other components within normal limits  WET PREP, GENITAL  CHLAMYDIA/NGC RT PCR ([facility] ONLY)  PREGNANCY, URINE  CBG MONITORING, ED   ____________________________________________  EKG   ____________________________________________  RADIOLOGY   No results found.  ____________________________________________    PROCEDURES  Procedure(s) performed:    Procedures    Medications - No data to display   ____________________________________________   INITIAL IMPRESSION / ASSESSMENT AND PLAN / ED COURSE  Pertinent labs & imaging results that were available during my care of the patient were reviewed by  me and considered in my medical decision making (see chart for details).  Review of the [place] CSRS was performed in accordance of the [facility] prior to dispensing any controlled drugs.  Clinical Course    Patient's diagnosis is consistent with Dysuria and cystitis. No indication for UTI at this time. Urinalysis returns with reassuring results. Patient also endorsed some vaginal discomfort. Patient denied any discharge. Examination reveals no cervical motion tenderness, masses or abnormalities to the adnexa or uterus. Labs returned with no acute indication of trichomoniasis, BV, candidal infection. Negative pregnancy test. At this time, no indication of concern for further, more serious underlying etiologies. No further imaging or labs are deemed necessary at this time.. Patient will be  discharged home with prescriptions for symptomatic medications of symptom control. Patient is to follow up with primary care or OB/GYN as needed or otherwise directed. Patient is given ED precautions to return to the ED for any worsening or new symptoms.     ____________________________________________  FINAL CLINICAL IMPRESSION(S) / ED DIAGNOSES  Final diagnoses:  Dysuria  Acute cystitis without hematuria      NEW MEDICATIONS STARTED DURING THIS VISIT:  Discharge Medication List as of 09/03/2016 10:05 PM    START taking these medications   Details  naproxen (NAPROSYN) 500 MG tablet Take 1 tablet (500 mg total) by mouth 2 (two) times daily with a meal., Starting Wed 09/03/2016, Print    phenazopyridine (PYRIDIUM) 95 MG tablet Take 1 tablet (95 mg total) by mouth 3 (three) times daily as needed for pain., Starting Wed 09/03/2016, Print            This chart was dictated using voice recognition software/Dragon. Despite best efforts to proofread, errors can occur which can change the meaning. Any change was purely unintentional.    Racheal PatchesJonathan D Cliffton Spradley, PA-C 09/03/16 2222    Willy EddyPatrick Robinson, MD 09/03/16 2230

## 2016-09-14 ENCOUNTER — Encounter: Payer: Self-pay | Admitting: Emergency Medicine

## 2016-09-14 ENCOUNTER — Emergency Department: Payer: Medicaid Other

## 2016-09-14 DIAGNOSIS — X501XXA Overexertion from prolonged static or awkward postures, initial encounter: Secondary | ICD-10-CM | POA: Insufficient documentation

## 2016-09-14 DIAGNOSIS — Y999 Unspecified external cause status: Secondary | ICD-10-CM | POA: Insufficient documentation

## 2016-09-14 DIAGNOSIS — Y939 Activity, unspecified: Secondary | ICD-10-CM | POA: Insufficient documentation

## 2016-09-14 DIAGNOSIS — Z79899 Other long term (current) drug therapy: Secondary | ICD-10-CM | POA: Insufficient documentation

## 2016-09-14 DIAGNOSIS — J45909 Unspecified asthma, uncomplicated: Secondary | ICD-10-CM | POA: Insufficient documentation

## 2016-09-14 DIAGNOSIS — S93401A Sprain of unspecified ligament of right ankle, initial encounter: Secondary | ICD-10-CM | POA: Insufficient documentation

## 2016-09-14 DIAGNOSIS — Y929 Unspecified place or not applicable: Secondary | ICD-10-CM | POA: Insufficient documentation

## 2016-09-14 DIAGNOSIS — Z791 Long term (current) use of non-steroidal anti-inflammatories (NSAID): Secondary | ICD-10-CM | POA: Insufficient documentation
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
## 2016-09-15 ENCOUNTER — Emergency Department
Admission: EM | Admit: 2016-09-15 | Discharge: 2016-09-15 | Disposition: A | Discharge status: Home / Self Care | Payer: Medicaid Other | Attending: Emergency Medicine | Admitting: Emergency Medicine

## 2016-09-15 DIAGNOSIS — S93401A Sprain of unspecified ligament of right ankle, initial encounter: Secondary | ICD-10-CM

## 2016-09-15 NOTE — Discharge Instructions (Signed)
Please return immediately if condition worsens. Please contact her primary physician or the physician you were given for referral. If you have any specialist physicians involved in her treatment and plan please also contact them. Thank you for using [facility].  Please continue to rest, ice, and elevate the right ankle you can take the splint on and off but I would keep it intact when ambulatory.

## 2016-09-15 NOTE — ED Provider Notes (Signed)
Time Seen: Approximately 1252  I have reviewed the triage notes  Chief Complaint: Foot Pain   History of Present Illness: Crystal Gibson is a 45 y.o. female who states some right foot and ankle pain. Patient had previous surgery with a fracture and pins in her great toe. Patient states she twisted her ankle and Tuesday and Friday she has been able to bear weightt.   Past Medical History:  Diagnosis Date  . Asthma   . Environmental allergies   . Gestational diabetes     There are no active problems to display for this patient.   Past Surgical History:  Procedure Laterality Date  . ABDOMINAL HYSTERECTOMY      Past Surgical History:  Procedure Laterality Date  . ABDOMINAL HYSTERECTOMY      Current Outpatient Rx  . Order #: [id number] Class: Historical Med  . Order #: [id number] Class: Print  . Order #: [id number] Class: Historical Med  . Order #: [id number] Class: Print  . Order #: [id number] Class: Print  . Order #: [id number] Class: Print  . Order #: [id number] Class: Historical Med  . Order #: [id number] Class: Historical Med  . Order #: [id number] Class: Print  . Order #: [id number] Class: Print  . Order #: [id number] Class: Historical Med    Allergies:  Review of patient's allergies indicates no known allergies.  Family History: No family history on file.  Social History: Social History  Substance Use Topics  . Smoking status: Never Smoker  . Smokeless tobacco: Never Used  . Alcohol use No     Review of Systems:   10 point review of systems was performed and was otherwise negative:  Constitutional: No fever Extremities:pain is mainly over the lateral surface of her right ankle. She denies any hip or knee pain. Skin: No rashes, easy bruising Neurologic: No focal weakness, trouble with speech or swollowing  Physical Exam:  ED Triage Vitals  Enc Vitals Group     BP 09/14/16 2304 (!) 127/102     Pulse Rate 09/14/16 2304 (!) 106     Resp 09/14/16 2304 18     Temp  09/14/16 2304 98.6 F (37 C)     Temp Source 09/14/16 2304 Oral     SpO2 09/14/16 2304 100 %     Weight 09/14/16 2305 235 lb (106.6 kg)     Height 09/14/16 2305 5\' 9"  (1.753 m)     Head Circumference --      Peak Flow --      Pain Score 09/14/16 2306 7     Pain Loc --      Pain Edu? --      Excl. in GC? --     General: Awake , Alert , and Oriented times 3; GCS 15  Extremities: 2 plus symmetric pulses. No edema, clubbing or cyanosis. Tenderness mainly over the lateral surface of the ankle. The Achilles is stable. No tenderness over the fifth metatarsal region.  Neurologic: normal ambulation, Motor symmetric without deficits, sensory intact Skin: warm, dry, no rashes    Radiology:  "Dg Foot Complete Right  Result Date: 09/14/2016 CLINICAL DATA:  Acute onset of right foot and ankle pain, after twisting injury. Initial encounter. EXAM: RIGHT FOOT COMPLETE - 3+ VIEW COMPARISON:  Right foot radiographs performed 09/09/2013 FINDINGS: There is no evidence of fracture or dislocation. The joint spaces are preserved. There is no evidence of talar subluxation; the subtalar joint is unremarkable in appearance. A pin is noted within the distal first metatarsal. No significant soft tissue abnormalities  are seen. IMPRESSION: No evidence of fracture or dislocation. Electronically Signed   By: Roanna RaiderJeffery  Chang M.D.   On: 09/14/2016 23:59  "  I personally reviewed the radiologic studies    ED Course:  Patient's stay was uneventful and she had a air cast placed on the right ankle.  Clinical Course     Assessment: right ankle sprain   Final Clinical Impression:   Final diagnoses:  Ankle sprain, right, initial encounter     Plan:  Outpatient Patient was advised to return immediately if condition worsens. Patient was advised to follow up with their primary care physician or other specialized physicians involved in their outpatient care. The patient and/or family member/power of attorney had  laboratory results reviewed at the bedside. All questions and concerns were addressed and appropriate discharge instructions were distributed by the nursing staff.             Jennye MoccasinBrian S Quigley, MD 09/15/16 0730

## 2016-09-28 ENCOUNTER — Emergency Department
Admission: EM | Admit: 2016-09-28 | Discharge: 2016-09-28 | Disposition: A | Discharge status: Home / Self Care | Payer: Medicaid Other | Attending: Emergency Medicine | Admitting: Emergency Medicine

## 2016-09-28 DIAGNOSIS — J029 Acute pharyngitis, unspecified: Secondary | ICD-10-CM | POA: Insufficient documentation

## 2016-09-28 DIAGNOSIS — Z79899 Other long term (current) drug therapy: Secondary | ICD-10-CM | POA: Insufficient documentation

## 2016-09-28 DIAGNOSIS — J45909 Unspecified asthma, uncomplicated: Secondary | ICD-10-CM | POA: Insufficient documentation

## 2016-09-28 LAB — POCT RAPID STREP A: Streptococcus, Group A Screen (Direct): NEGATIVE

## 2016-09-28 MED ORDER — MAGIC MOUTHWASH W/LIDOCAINE: 5.0000 mL | Freq: Four times a day (QID) | ORAL | 0 refills | Status: DC

## 2016-09-28 MED ORDER — FLUTICASONE PROPIONATE 50 MCG/ACT NA SUSP: 1.0000 | Freq: Two times a day (BID) | NASAL | 0 refills | Status: DC

## 2016-09-28 MED ORDER — IBUPROFEN 800 MG PO TABS: 800.0000 mg | ORAL_TABLET | Freq: Three times a day (TID) | ORAL | 0 refills | Status: DC | PRN

## 2016-09-28 NOTE — ED Provider Notes (Signed)
[facility] Emergency Department Provider Note  ____________________________________________  Time seen: Approximately 8:12 PM  I have reviewed the triage vital signs and the nursing notes.   HISTORY  Chief Complaint Sore Throat    HPI Crystal Gibson is a 45 y.o. female who presents emergency department for several day history of sore throat and ear pain. Patient states that pain is sharp in nature. No difficulty swallowing or breathing. There is pain with swallowing. Patient reports mild, subjective, tactile fever. Patient denies any nasal congestion, coughing, shortness of breath. No medications prior to arrival.   Past Medical History:  Diagnosis Date  . Asthma   . Environmental allergies   . Gestational diabetes     There are no active problems to display for this patient.   Past Surgical History:  Procedure Laterality Date  . ABDOMINAL HYSTERECTOMY      Prior to Admission medications   Medication Sig Start Date End Date Taking? Authorizing Provider  acetaminophen (TYLENOL) 325 MG tablet Take 650 mg by mouth every 6 (six) hours as needed for pain.    Historical Provider, MD  albuterol (PROVENTIL HFA;VENTOLIN HFA) 108 (90 BASE) MCG/ACT inhaler Inhale 2 puffs into the lungs every 4 (four) hours as needed for wheezing or shortness of breath. 10/28/15   Delorise RoyalsJonathan D Cuthriell, PA-C  Cetirizine HCl (ZYRTEC ALLERGY PO) Take 1 tablet by mouth at bedtime.     Historical Provider, MD  diazepam (VALIUM) 2 MG tablet Take 1 tablet (2 mg total) by mouth every 8 (eight) hours as needed for muscle spasms. 03/14/16   Tommi Rumpshonda L Summers, PA-C  HYDROcodone-acetaminophen (NORCO/VICODIN) 5-325 MG tablet Take 1 tablet by mouth every 4 (four) hours as needed for moderate pain. 03/14/16   Tommi Rumpshonda L Summers, PA-C  naproxen (NAPROSYN) 500 MG tablet Take 1 tablet (500 mg total) by mouth 2 (two) times daily with a meal. 09/03/16   Delorise RoyalsJonathan D Cuthriell, PA-C  OVER THE COUNTER  MEDICATION Take 1 tablet by mouth daily. Weight loss with Apple cider vinegar, kelp, lecithin and b-6    Historical Provider, MD  OVER THE COUNTER MEDICATION Take 1 tablet by mouth daily. Womens  essential vitamin    Historical Provider, MD  phenazopyridine (PYRIDIUM) 95 MG tablet Take 1 tablet (95 mg total) by mouth 3 (three) times daily as needed for pain. 09/03/16   Delorise RoyalsJonathan D Cuthriell, PA-C  traMADol (ULTRAM) 50 MG tablet Take 1 tablet (50 mg total) by mouth every 6 (six) hours as needed. 02/26/14   Roxy Horsemanobert Browning, PA-C  traZODone (DESYREL) 50 MG tablet Take 50 mg by mouth at bedtime.    Historical Provider, MD    Allergies Review of patient's allergies indicates no known allergies.  No family history on file.  Social History Social History  Substance Use Topics  . Smoking status: Never Smoker  . Smokeless tobacco: Never Used  . Alcohol use No     Review of Systems  Constitutional: No fever/chills Eyes: No visual changes. No discharge ENT: Has a for bilateral ear pain and sore throat Cardiovascular: no chest pain. Respiratory: no cough. No SOB. Gastrointestinal: No abdominal pain.  No nausea, no vomiting.  No diarrhea.  No constipation. Musculoskeletal: Negative for musculoskeletal pain. Skin: Negative for rash, abrasions, lacerations, ecchymosis. Neurological: Negative for headaches, focal weakness or numbness. 10-point ROS otherwise negative.  ____________________________________________   PHYSICAL EXAM:  VITAL SIGNS: ED Triage Vitals [09/28/16 1949]  Enc Vitals Group     BP 139/93  Pulse  95     Resp      Temp  99.1     Temp src      SpO2       Weight 240 lb (108.9 kg)     Height 5\' 9"  (1.753 m)     Head Circumference      Peak Flow      Pain Score 5     Pain Loc      Pain Edu?      Excl. in GC?      Constitutional: Alert and oriented. Well appearing and in no acute distress. Eyes: Conjunctivae are normal. PERRL. EOMI. Head: Atraumatic. ENT:       Ears: EACs unremarkable bilaterally. TMs are mildly bulging but no air-fluid level.      Nose: Mild clear congestion/rhinnorhea.      Mouth/Throat: Mucous membranes are moist. Oropharynx is mildly erythematous but nonedematous. Tonsils are mildly erythematous but no exudates. Uvula is midline. Neck: No stridor. Neck is supple full range of motion Hematological/Lymphatic/Immunilogical: No cervical lymphadenopathy. Cardiovascular: Normal rate, regular rhythm. Normal S1 and S2.  Good peripheral circulation. Respiratory: Normal respiratory effort without tachypnea or retractions. Lungs CTAB. Good air entry to the bases with no decreased or absent breath sounds. Musculoskeletal: Full range of motion to all extremities. No gross deformities appreciated. Neurologic:  Normal speech and language. No gross focal neurologic deficits are appreciated.  Skin:  Skin is warm, dry and intact. No rash noted. Psychiatric: Mood and affect are normal. Speech and behavior are normal. Patient exhibits appropriate insight and judgement.   ____________________________________________   LABS (all labs ordered are listed, but only abnormal results are displayed)  Labs Reviewed  POCT RAPID STREP A   ____________________________________________  EKG   ____________________________________________  RADIOLOGY   No results found.  ____________________________________________    PROCEDURES  Procedure(s) performed:    Procedures    Medications - No data to display   ____________________________________________   INITIAL IMPRESSION / ASSESSMENT AND PLAN / ED COURSE  Pertinent labs & imaging results that were available during my care of the patient were reviewed by me and considered in my medical decision making (see chart for details).  Review of the [place] CSRS was performed in accordance of the [facility] prior to dispensing any controlled drugs.  Clinical Course    Patient's diagnosis is  consistent with Viral pharyngitis. Patient had a negative strep tests and emergency department. Patient is negative on the [facility] criteria for strep. Exam is reassuring.. Patient will be discharged home with prescriptions for symptomatic control medications. Patient is to follow up with primary care as needed or otherwise directed. Patient is given ED precautions to return to the ED for any worsening or new symptoms.     ____________________________________________  FINAL CLINICAL IMPRESSION(S) / ED DIAGNOSES  Final diagnoses:  None      NEW MEDICATIONS STARTED DURING THIS VISIT:  New Prescriptions   No medications on file        This chart was dictated using voice recognition software/Dragon. Despite best efforts to proofread, errors can occur which can change the meaning. Any change was purely unintentional.    Racheal Patches, PA-C 09/28/16 2032    Myrna Blazer, MD 09/29/16 [phone]

## 2016-09-28 NOTE — ED Notes (Signed)
Patient to ED for complaints of bilateral ear pain and "little ninjas stabbing her in the throat when she tries to swallow." Patient denies fever and states tonight her throat hurt so bad she was unable to eat "and fat girls gonna eat but I couldn't." Denies ear drainage.
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
## 2017-03-26 ENCOUNTER — Encounter (HOSPITAL_COMMUNITY): Payer: Self-pay

## 2017-03-26 ENCOUNTER — Emergency Department (HOSPITAL_COMMUNITY)
Admission: EM | Admit: 2017-03-26 | Discharge: 2017-03-26 | Disposition: A | Discharge status: Home / Self Care | Payer: Medicaid Other | Attending: Emergency Medicine | Admitting: Emergency Medicine

## 2017-03-26 DIAGNOSIS — T781XXA Other adverse food reactions, not elsewhere classified, initial encounter: Secondary | ICD-10-CM

## 2017-03-26 DIAGNOSIS — L509 Urticaria, unspecified: Secondary | ICD-10-CM

## 2017-03-26 DIAGNOSIS — T782XXA Anaphylactic shock, unspecified, initial encounter: Secondary | ICD-10-CM | POA: Insufficient documentation

## 2017-03-26 DIAGNOSIS — J45909 Unspecified asthma, uncomplicated: Secondary | ICD-10-CM | POA: Insufficient documentation

## 2017-03-26 MED ORDER — SODIUM CHLORIDE 0.9 % IV SOLN: 50.0000 mg | INTRAVENOUS | Status: DC | PRN

## 2017-03-26 MED ORDER — METHYLPREDNISOLONE SODIUM SUCC 125 MG IJ SOLR
125.0000 mg | Freq: Once | INTRAMUSCULAR | Status: AC
Start: 2017-03-26 — End: 2017-03-26
  Administered 2017-03-26: 125 mg via INTRAVENOUS
  Filled 2017-03-26: qty 2

## 2017-03-26 MED ORDER — EPINEPHRINE 0.3 MG/0.3ML IJ SOAJ: 0.3000 mg | Freq: Once | INTRAMUSCULAR | 0 refills | Status: AC

## 2017-03-26 MED ORDER — FAMOTIDINE IN NACL 20-0.9 MG/50ML-% IV SOLN
20.0000 mg | Freq: Once | INTRAVENOUS | Status: AC
Start: 2017-03-26 — End: 2017-03-26
  Administered 2017-03-26: 20 mg via INTRAVENOUS
  Filled 2017-03-26: qty 50

## 2017-03-26 MED ORDER — DIPHENHYDRAMINE HCL 50 MG/ML IJ SOLN
50.0000 mg | Freq: Once | INTRAMUSCULAR | Status: AC
  Administered 2017-03-26: 50 mg via INTRAVENOUS
  Filled 2017-03-26: qty 1

## 2017-03-26 NOTE — ED Triage Notes (Signed)
Per Pt, Pt reports being home today at 1100 and reporting swollen tongue and dyspnea. Pt reports that she felt swelling in her throat and face. Pt took her son's Epipen with some relief, but reports rash, itching, and throat burning that continues.

## 2017-03-26 NOTE — ED Provider Notes (Signed)
[facility]-EMERGENCY DEPT Provider Note   CSN: [id number] Arrival date & time: 03/26/17  1607     History   Chief Complaint Chief Complaint  Patient presents with  . Allergic Reaction    HPI Crystal Gibson is a 46 y.o. female PMH asthma and seasonal allergies presents with burning itching skin and a squeezing sensation in her through and tongue swelling. She woke up with these symptoms this morning and took her child's epinephrine pen and felt improvement in her symptoms. She was feeling like her usual self except having some abdominal discomfort, nausea, and vomiting. She does have a history of shellfish allergy and avoids eating outside of her home but she was at an event yesterday and had food that may have been cross contaminated with shellfish. These symptosm are similar to what she experienced with an allergic reaction in the past.   HPI  Past Medical History:  Diagnosis Date  . Asthma   . Environmental allergies   . Gestational diabetes     There are no active problems to display for this patient.   Past Surgical History:  Procedure Laterality Date  . ABDOMINAL HYSTERECTOMY      OB History    No data available       Home Medications    Prior to Admission medications   Medication Sig Start Date End Date Taking? Authorizing Provider  acetaminophen (TYLENOL) 325 MG tablet Take 650 mg by mouth every 6 (six) hours as needed for pain.    Historical Provider, MD  albuterol (PROVENTIL HFA;VENTOLIN HFA) 108 (90 BASE) MCG/ACT inhaler Inhale 2 puffs into the lungs every 4 (four) hours as needed for wheezing or shortness of breath. 10/28/15   Delorise RoyalsJonathan D Cuthriell, PA-C  Cetirizine HCl (ZYRTEC ALLERGY PO) Take 1 tablet by mouth at bedtime.     Historical Provider, MD  diazepam (VALIUM) 2 MG tablet Take 1 tablet (2 mg total) by mouth every 8 (eight) hours as needed for muscle spasms. 03/14/16   Tommi Rumpshonda L Summers, PA-C  fluticasone (FLONASE) 50 MCG/ACT nasal spray Place 1 spray into  both nostrils 2 (two) times daily. 09/28/16   Delorise RoyalsJonathan D Cuthriell, PA-C  HYDROcodone-acetaminophen (NORCO/VICODIN) 5-325 MG tablet Take 1 tablet by mouth every 4 (four) hours as needed for moderate pain. 03/14/16   Tommi Rumpshonda L Summers, PA-C  ibuprofen (ADVIL,MOTRIN) 800 MG tablet Take 1 tablet (800 mg total) by mouth every 8 (eight) hours as needed. 09/28/16   Delorise RoyalsJonathan D Cuthriell, PA-C  magic mouthwash w/lidocaine SOLN Take 5 mLs by mouth 4 (four) times daily. 09/28/16   Delorise RoyalsJonathan D Cuthriell, PA-C  naproxen (NAPROSYN) 500 MG tablet Take 1 tablet (500 mg total) by mouth 2 (two) times daily with a meal. 09/03/16   Delorise RoyalsJonathan D Cuthriell, PA-C  OVER THE COUNTER MEDICATION Take 1 tablet by mouth daily. Weight loss with Apple cider vinegar, kelp, lecithin and b-6    Historical Provider, MD  OVER THE COUNTER MEDICATION Take 1 tablet by mouth daily. Womens  essential vitamin    Historical Provider, MD  phenazopyridine (PYRIDIUM) 95 MG tablet Take 1 tablet (95 mg total) by mouth 3 (three) times daily as needed for pain. 09/03/16   Delorise RoyalsJonathan D Cuthriell, PA-C  traMADol (ULTRAM) 50 MG tablet Take 1 tablet (50 mg total) by mouth every 6 (six) hours as needed. 02/26/14   Roxy Horsemanobert Browning, PA-C  traZODone (DESYREL) 50 MG tablet Take 50 mg by mouth at bedtime.    Historical Provider, MD  Family History No family history on file.  Social History Social History  Substance Use Topics  . Smoking status: Never Smoker  . Smokeless tobacco: Never Used  . Alcohol use No     Allergies   Patient has no known allergies.   Review of Systems Review of Systems  Respiratory: Positive for shortness of breath. Negative for cough and wheezing.   Cardiovascular: Negative for chest pain.  Gastrointestinal: Positive for abdominal pain, nausea and vomiting.  Skin: Positive for rash.  All other systems reviewed and are negative.    Physical Exam Updated Vital Signs BP (!) 148/103 (BP Location: Right Arm)   Pulse (!) 104    Temp 98.7 F (37.1 C) (Oral)   Resp 18   Ht 5\' 9"  (1.753 m)   Wt 104.3 kg   SpO2 100%   BMI 33.97 kg/m   Physical Exam  Constitutional: She is oriented to person, place, and time. She appears well-developed and well-nourished. No distress.  HENT:  Head: Normocephalic and atraumatic.  Mouth/Throat: Oropharynx is clear and moist. No oropharyngeal exudate.  Eyes: Conjunctivae are normal. No scleral icterus.  Cardiovascular: Normal rate and regular rhythm.   No murmur heard. Pulmonary/Chest: Effort normal. No respiratory distress. She has no wheezes. She has no rales.  Abdominal: Soft. Bowel sounds are normal. She exhibits no distension. There is no tenderness.  Neurological: She is alert and oriented to person, place, and time.  Skin: Skin is warm and dry. No rash noted. She is not diaphoretic.  Psychiatric: She has a normal mood and affect. Her behavior is normal.     ED Treatments / Results  Labs (all labs ordered are listed, but only abnormal results are displayed) Labs Reviewed - No data to display  EKG  EKG Interpretation None       Radiology No results found.  Procedures Procedures (including critical care time)  Medications Ordered in ED Medications  diphenhydrAMINE (BENADRYL) 50 mg in sodium chloride 0.9 % 50 mL IVPB (not administered)  famotidine (PEPCID) IVPB 20 mg premix (not administered)  methylPREDNISolone sodium succinate (SOLU-MEDROL) 125 mg/2 mL injection 125 mg (not administered)     Initial Impression / Assessment and Plan / ED Course  I have reviewed the triage vital signs and the nursing notes.  Pertinent labs & imaging results that were available during my care of the patient were reviewed by me and considered in my medical decision making (see chart for details).  Crystal Gibson is a 46 yo woman with PMH asthma and seasonal allergies who presents with urticaria and throat swelling which she believes may be related to a seafood exposure. Her  symptoms improved after 1 dose of epinephrine this morning. She does not meet criteria for another dose of epinephrine   18:15 Given IV benadryl 40 mg, IV pepcid 20 mg, and IV solumedrol 125 mg.   20:22 Pt reports improvement in urticaria, continues to have discomfort in her throat.    Symptoms have improved over the past 4 hours of observation. Discharged with instructions to follow up with her PCP within the next 3 days. Discussed return precautions and the risk of biphasic anaphylaxis. Provided her with an epi pen prescription.   Final Clinical Impressions(s) / ED Diagnoses   Final diagnoses:  None    New Prescriptions New Prescriptions   No medications on file     Eulah Pont, MD 03/26/17 2040    Eulah Pont, MD 03/26/17 2130    Melene Plan, DO  03/26/17 2053  

## 2017-03-26 NOTE — Discharge Instructions (Signed)
Ms. Daiva HugeSlade,  You may have had an allergic reaction related to some food that you consumed. I am glad you're feeling better now. A rare complication after an allergic reaction is experiencing the same symptoms within a week of the initial exposure. If this happens to you, you'll need to administer the epi pen again and come back to the emergency room immediately. Please schedule an appointment to see your primary care doctor within the next 3 days.

## 2017-12-11 ENCOUNTER — Emergency Department (HOSPITAL_COMMUNITY): Payer: No Typology Code available for payment source

## 2017-12-11 ENCOUNTER — Emergency Department (HOSPITAL_COMMUNITY)
Admission: EM | Admit: 2017-12-11 | Discharge: 2017-12-11 | Disposition: A | Discharge status: Home / Self Care | Payer: No Typology Code available for payment source | Attending: Emergency Medicine | Admitting: Emergency Medicine

## 2017-12-11 ENCOUNTER — Encounter (HOSPITAL_COMMUNITY): Payer: Self-pay | Admitting: Emergency Medicine

## 2017-12-11 DIAGNOSIS — M25562 Pain in left knee: Secondary | ICD-10-CM | POA: Diagnosis not present

## 2017-12-11 DIAGNOSIS — M25561 Pain in right knee: Secondary | ICD-10-CM | POA: Diagnosis not present

## 2017-12-11 DIAGNOSIS — J45909 Unspecified asthma, uncomplicated: Secondary | ICD-10-CM | POA: Diagnosis not present

## 2017-12-11 DIAGNOSIS — Z79899 Other long term (current) drug therapy: Secondary | ICD-10-CM | POA: Diagnosis not present

## 2017-12-11 MED ORDER — IBUPROFEN 200 MG PO TABS
600.0000 mg | ORAL_TABLET | Freq: Once | ORAL | Status: AC
  Administered 2017-12-11: 600 mg via ORAL
  Filled 2017-12-11: qty 1
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
## 2017-12-11 NOTE — ED Notes (Signed)
Patient transported to X-ray 
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
## 2018-03-19 ENCOUNTER — Ambulatory Visit: Payer: Self-pay | Admitting: Nurse Practitioner

## 2018-03-25 ENCOUNTER — Ambulatory Visit: Payer: Self-pay | Admitting: Internal Medicine

## 2018-03-30 ENCOUNTER — Other Ambulatory Visit: Payer: Self-pay

## 2018-04-09 ENCOUNTER — Emergency Department (HOSPITAL_COMMUNITY)
Admission: EM | Admit: 2018-04-09 | Discharge: 2018-04-09 | Disposition: A | Discharge status: Home / Self Care | Payer: BLUE CROSS/BLUE SHIELD | Attending: Emergency Medicine | Admitting: Emergency Medicine

## 2018-04-09 ENCOUNTER — Other Ambulatory Visit: Payer: Self-pay

## 2018-04-09 ENCOUNTER — Encounter (HOSPITAL_COMMUNITY): Payer: Self-pay | Admitting: Emergency Medicine

## 2018-04-09 ENCOUNTER — Emergency Department (HOSPITAL_COMMUNITY): Payer: BLUE CROSS/BLUE SHIELD

## 2018-04-09 DIAGNOSIS — R079 Chest pain, unspecified: Secondary | ICD-10-CM | POA: Diagnosis present

## 2018-04-09 DIAGNOSIS — Z79899 Other long term (current) drug therapy: Secondary | ICD-10-CM | POA: Insufficient documentation

## 2018-04-09 DIAGNOSIS — J45909 Unspecified asthma, uncomplicated: Secondary | ICD-10-CM | POA: Diagnosis not present

## 2018-04-09 DIAGNOSIS — R0789 Other chest pain: Secondary | ICD-10-CM | POA: Diagnosis not present

## 2018-04-09 LAB — CBC
HEMATOCRIT: 40.1 % (ref 36.0–46.0)
Hemoglobin: 13.8 g/dL (ref 12.0–15.0)
MCH: 30.5 pg (ref 26.0–34.0)
MCHC: 34.4 g/dL (ref 30.0–36.0)
MCV: 88.5 fL (ref 78.0–100.0)
Platelets: 289 10*3/uL (ref 150–400)
RBC: 4.53 MIL/uL (ref 3.87–5.11)
RDW: 13.1 % (ref 11.5–15.5)
WBC: 4.1 10*3/uL (ref 4.0–10.5)

## 2018-04-09 LAB — I-STAT TROPONIN, ED
Troponin i, poc: 0 ng/mL (ref 0.00–0.08)
Troponin i, poc: 0 ng/mL (ref 0.00–0.08)

## 2018-04-09 LAB — BASIC METABOLIC PANEL
ANION GAP: 12 (ref 5–15)
BUN: 9 mg/dL (ref 6–20)
CO2: 24 mmol/L (ref 22–32)
Calcium: 9.6 mg/dL (ref 8.9–10.3)
Chloride: 99 mmol/L — ABNORMAL LOW (ref 101–111)
Creatinine, Ser: 0.8 mg/dL (ref 0.44–1.00)
GFR calc Af Amer: >60 mL/min (ref >60)
Glucose, Bld: 411 mg/dL — ABNORMAL HIGH (ref 65–99)
POTASSIUM: 4.1 mmol/L (ref 3.5–5.1)
SODIUM: 135 mmol/L (ref 135–145)

## 2018-04-09 LAB — I-STAT BETA HCG BLOOD, ED (MC, WL, AP ONLY)

## 2018-04-09 MED ORDER — KETOROLAC TROMETHAMINE 15 MG/ML IJ SOLN
15.0000 mg | Freq: Once | INTRAMUSCULAR | Status: AC
  Administered 2018-04-09: 15 mg via INTRAVENOUS
  Filled 2018-04-09: qty 1
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
## 2018-04-09 NOTE — Progress Notes (Signed)
Pt being seen here today who "did not expect to be here this long" has a 89 year old child with a trach who needs suction. RN and RT called charge RT and [facility] to get clarification on ability to provide suction for a non pt. AC gave the clear to allow a 1x exception to allow them to suction due to not wanting child to suffer. Pt given a #6 and #10 F suction catheter and a saline flush per request. RT stayed at bedside and mother instilled saline down trach and suctioned x 2 passes. Suction was then thrown away by this RT to ensure it did not become a regular occurrence. Pt was informed that if child needed to be suctioned more than 1x, she would need to be seen in the pediatric ed. Pt stated she understood. RN aware.

## 2018-04-09 NOTE — ED Provider Notes (Signed)
[facility] Provider Note   CSN: [id number] Arrival date & time: 04/09/18  1250     History   Chief Complaint Chief Complaint  Patient presents with  . Chest Pain    HPI Crystal Gibson is a 47 y.o. female.  The history is provided by the patient.  Chest Pain   This is a new problem. The current episode started 6 to 12 hours ago. The problem occurs constantly. The problem has been gradually improving. The pain is associated with raising an arm. The pain is present in the substernal region. The pain is at a severity of 4/10. The pain is mild. The quality of the pain is described as stabbing. The pain does not radiate. The symptoms are aggravated by certain positions. Pertinent negatives include no abdominal pain, no back pain, no cough, no diaphoresis, no dizziness, no fever, no irregular heartbeat, no lower extremity edema, no near-syncope, no palpitations, no shortness of breath, no sputum production, no syncope, no vomiting and no weakness. She has tried nothing for the symptoms. The treatment provided no relief. There are no known risk factors.  Pertinent negatives for past medical history include no seizures.    Past Medical History:  Diagnosis Date  . Asthma   . Environmental allergies   . Gestational diabetes     There are no active problems to display for this patient.   Past Surgical History:  Procedure Laterality Date  . ABDOMINAL HYSTERECTOMY       OB History   None      Home Medications    Prior to Admission medications   Medication Sig Start Date End Date Taking? Authorizing Provider  albuterol (PROVENTIL HFA;VENTOLIN HFA) 108 (90 BASE) MCG/ACT inhaler Inhale 2 puffs into the lungs every 4 (four) hours as needed for wheezing or shortness of breath. 10/28/15   Cuthriell, Delorise RoyalsJonathan D, PA-C  cetirizine (ZYRTEC) 10 MG tablet Take 10 mg by mouth daily.    [provider]  diazepam (VALIUM) 2 MG tablet Take 1  tablet (2 mg total) by mouth every 8 (eight) hours as needed for muscle spasms. 03/14/16   Tommi RumpsSummers, Rhonda L, PA-C  fluticasone (FLONASE) 50 MCG/ACT nasal spray Place 1 spray into both nostrils 2 (two) times daily. 09/28/16   Cuthriell, Delorise RoyalsJonathan D, PA-C  HYDROcodone-acetaminophen (NORCO/VICODIN) 5-325 MG tablet Take 1 tablet by mouth every 4 (four) hours as needed for moderate pain. Patient not taking: Reported on 03/26/2017 03/14/16   Tommi RumpsSummers, Rhonda L, PA-C  ibuprofen (ADVIL,MOTRIN) 800 MG tablet Take 1 tablet (800 mg total) by mouth every 8 (eight) hours as needed. Patient not taking: Reported on 12/11/2017 09/28/16   Cuthriell, Delorise RoyalsJonathan D, PA-C  magic mouthwash w/lidocaine SOLN Take 5 mLs by mouth 4 (four) times daily. Patient not taking: Reported on 03/26/2017 09/28/16   Cuthriell, Delorise RoyalsJonathan D, PA-C  naproxen (NAPROSYN) 500 MG tablet Take 1 tablet (500 mg total) by mouth 2 (two) times daily with a meal. Patient not taking: Reported on 12/11/2017 09/03/16   Cuthriell, Delorise RoyalsJonathan D, PA-C  phenazopyridine (PYRIDIUM) 95 MG tablet Take 1 tablet (95 mg total) by mouth 3 (three) times daily as needed for pain. Patient not taking: Reported on 03/26/2017 09/03/16   Cuthriell, Delorise RoyalsJonathan D, PA-C  traMADol (ULTRAM) 50 MG tablet Take 1 tablet (50 mg total) by mouth every 6 (six) hours as needed. Patient not taking: Reported on 03/26/2017 02/26/14   Roxy HorsemanBrowning, Robert, PA-C  traZODone (DESYREL) 50 MG tablet Take  50 mg by mouth at bedtime.    [provider]    Family History No family history on file.  Social History Social History   Tobacco Use  . Smoking status: Never Smoker  . Smokeless tobacco: Never Used  Substance Use Topics  . Alcohol use: No  . Drug use: No     Allergies   Patient has no known allergies.   Review of Systems Review of Systems  Constitutional: Negative for chills, diaphoresis and fever.  HENT: Negative for ear pain and sore throat.   Eyes: Negative for pain and visual  disturbance.  Respiratory: Negative for cough, sputum production and shortness of breath.   Cardiovascular: Positive for chest pain. Negative for palpitations, syncope and near-syncope.  Gastrointestinal: Negative for abdominal pain and vomiting.  Genitourinary: Negative for dysuria and hematuria.  Musculoskeletal: Negative for arthralgias and back pain.  Skin: Negative for color change and rash.  Neurological: Negative for dizziness, seizures, syncope and weakness.  All other systems reviewed and are negative.    Physical Exam Updated Vital Signs  ED Triage Vitals  Enc Vitals Group     BP 04/09/18 1256 135/81     Pulse Rate 04/09/18 1255 94     Resp 04/09/18 1618 20     Temp 04/09/18 1255 97.9 F (36.6 C)     Temp src --      SpO2 04/09/18 1255 99 %     Weight 04/09/18 1258 224 lb (101.6 kg)     Height 04/09/18 1258 5\' 8"  (1.727 m)     Head Circumference --      Peak Flow --      Pain Score 04/09/18 1258 9     Pain Loc --      Pain Edu? --      Excl. in GC? --     Physical Exam  Constitutional: She appears well-developed and well-nourished. No distress.  HENT:  Head: Normocephalic and atraumatic.  Eyes: Pupils are equal, round, and reactive to light. Conjunctivae are normal.  Neck: Neck supple.  Cardiovascular: Normal rate, regular rhythm, intact distal pulses and normal pulses.  No murmur heard. Pulmonary/Chest: Effort normal and breath sounds normal. No respiratory distress. She has no decreased breath sounds.  Abdominal: Soft. There is no tenderness.  Musculoskeletal: She exhibits no edema.       Right lower leg: She exhibits no edema.       Left lower leg: She exhibits no edema.  TTP over sternum and pain worse with ROM of RUE  Neurological: She is alert.  Skin: Skin is warm and dry.  Psychiatric: She has a normal mood and affect.  Nursing note and vitals reviewed.    ED Treatments / Results  Labs (all labs ordered are listed, but only abnormal results  are displayed) Labs Reviewed  BASIC METABOLIC PANEL - Abnormal; Notable for the following components:      Result Value   Chloride 99 (*)    Glucose, Bld 411 (*)    All other components within normal limits  CBC  I-STAT TROPONIN, ED  I-STAT BETA HCG BLOOD, ED ([facility], [facility], [facility] ONLY)  I-STAT TROPONIN, ED    EKG EKG Interpretation  Date/Time:  Tuesday March 30 2018 17:30:00 EDT Ventricular Rate:  94 PR Interval:  146 QRS Duration: 92 QT Interval:  364 QTC Calculation: 455 R Axis:   100 Text Interpretation:  Normal sinus rhythm Rightward axis Possible Anterior infarct , age undetermined Abnormal ECG  Confirmed by Tilden Fossa [phone]) on 04/09/2018 3:28:17 PM   Radiology Dg Chest 2 View  Result Date: 04/09/2018 CLINICAL DATA:  Sharp chest pain. Radiation into right arm and across shoulders. EXAM: CHEST - 2 VIEW COMPARISON:  10/28/2015. FINDINGS: Mediastinum and hilar structures normal. Lungs are clear. No pleural effusion or pneumothorax. IMPRESSION: No acute cardiopulmonary disease. Electronically Signed   By: Maisie Fus  Register   On: 04/09/2018 13:16    Procedures Procedures (including critical care time)  Medications Ordered in ED Medications  ketorolac (TORADOL) 15 MG/ML injection 15 mg (15 mg Intravenous Given 04/09/18 1827)     Initial Impression / Assessment and Plan / ED Course  I have reviewed the triage vital signs and the nursing notes.  Pertinent labs & imaging results that were available during my care of the patient were reviewed by me and considered in my medical decision making (see chart for details).     Crystal Gibson is a 47 year old female history of fibromyalgia, asthma who presents to the ED with chest pain.  Patient with normal vitals.  No fever.  Patient with episode of chest pain this morning that is worse with moving her right upper extremity.  Patient states that pain is worse when she moves her arm and to palpation.  Patient denies any trauma.  Patient  does not have any known cardiac risk factors including no hypertension, diabetes, cholesterol issues.  Patient has no DVT or PE risk factors.  Patient is PERC negative and doubt PE.  Patient does not have any infectious symptoms including cough, sputum production, fever.  Patient with no signs of volume overload on exam.  Exam is overall unremarkable.  Patient is tender over her chest wall to palpation.  EKG performed shows sinus rhythm with no signs of ischemic changes.  Troponin within normal limits x2.  Doubt ACS.  Patient with low here score and no need for stress test at this time.  Suspect patient likely with costochondritis.  Patient had chest x-ray that showed no signs of pneumonia, pneumothorax, pleural effusion.  Patient with no significant anemia, electrolyte abnormality except for hyperglycemia.  Suspect possibly undiagnosed diabetes and patient would like to follow up with PCP about starting medication and declined metformin rx. Patient states ate food right before blood draw and had recent A1C that was 7.1. Recommend close follow-up with primary care provider.  Patient was given IV Toradol in the ED with some improvement of her pain.  Recommend continued use of Motrin and Tylenol at home for pain.  Told to be weightbearing as tolerated.  Told to return to the ED if symptoms worsen.  Final Clinical Impressions(s) / ED Diagnoses   Final diagnoses:  Atypical chest pain    ED Discharge Orders    None       Virgina Norfolk, DO 04/09/18 1859    Cathren Laine, MD 04/09/18 2141

## 2018-04-09 NOTE — ED Triage Notes (Signed)
Patient states sudden onset of 9/10 chest pain, dizziness, and nausea that started at 1000 this morning, states the pain woke her up from sleep. No medical history.

## 2018-04-19 ENCOUNTER — Emergency Department: Payer: Worker's Compensation

## 2018-04-19 ENCOUNTER — Other Ambulatory Visit: Payer: Self-pay

## 2018-04-19 ENCOUNTER — Emergency Department
Admission: EM | Admit: 2018-04-19 | Discharge: 2018-04-19 | Disposition: A | Discharge status: Home / Self Care | Payer: Worker's Compensation | Attending: Student in an Organized Health Care Education/Training Program | Admitting: Student in an Organized Health Care Education/Training Program

## 2018-04-19 ENCOUNTER — Encounter: Payer: Self-pay | Admitting: Emergency Medicine

## 2018-04-19 DIAGNOSIS — J45909 Unspecified asthma, uncomplicated: Secondary | ICD-10-CM | POA: Insufficient documentation

## 2018-04-19 DIAGNOSIS — R51 Headache: Secondary | ICD-10-CM | POA: Insufficient documentation

## 2018-04-19 DIAGNOSIS — M542 Cervicalgia: Secondary | ICD-10-CM | POA: Diagnosis not present

## 2018-04-19 DIAGNOSIS — Z79899 Other long term (current) drug therapy: Secondary | ICD-10-CM | POA: Insufficient documentation

## 2018-04-19 DIAGNOSIS — W19XXXA Unspecified fall, initial encounter: Secondary | ICD-10-CM

## 2018-04-19 DIAGNOSIS — W109XXA Fall (on) (from) unspecified stairs and steps, initial encounter: Secondary | ICD-10-CM | POA: Diagnosis not present

## 2018-04-19 MED ORDER — OXYCODONE-ACETAMINOPHEN 5-325 MG PO TABS
1.0000 | ORAL_TABLET | Freq: Once | ORAL | Status: AC
  Administered 2018-04-19: 1 via ORAL
  Filled 2018-04-19: qty 1

## 2018-04-19 MED ORDER — CYCLOBENZAPRINE HCL 5 MG PO TABS: ORAL_TABLET | ORAL | 0 refills | Status: DC

## 2018-04-19 MED ORDER — OXYCODONE-ACETAMINOPHEN 5-325 MG PO TABS: 1.0000 | ORAL_TABLET | ORAL | 0 refills | Status: AC | PRN

## 2018-04-19 MED ORDER — IBUPROFEN 600 MG PO TABS: 600.0000 mg | ORAL_TABLET | Freq: Four times a day (QID) | ORAL | 0 refills | Status: DC | PRN

## 2018-04-19 NOTE — ED Triage Notes (Signed)
Patient fell, slipped down stairs.  States fell down 13 stairs.  C/O head, back, neck, right upper arm pain.  Fell today at [employer]1130.

## 2018-04-19 NOTE — ED Notes (Signed)

## 2018-04-19 NOTE — ED Provider Notes (Signed)
[facility] Emergency Department Provider Note  ____________________________________________  Time seen: Approximately 5:04 PM  I have reviewed the triage vital signs and the nursing notes.   HISTORY  Chief Complaint Fall    HPI Crystal Gibson is a 47 y.o. female that presents emergency department for evaluation of headache, neck pain, shoulder pain after falling down 13 stairs onto a landing and then another flight of 5 stairs. She did not lose consciousness but saw stars when she hit the bottom of the steps.  She is walking normally.  This happened about 1130 this morning.  She took  650 mg of Tylenol this afternoon.  She drove from [place] to [place].  Tetanus shot is up-to-date.  No  dizziness, weakness, shortness of breath, chest pain, nausea, vomiting, abdominal pain.   Past Medical History:  Diagnosis Date  . Asthma   . Environmental allergies   . Gestational diabetes     There are no active problems to display for this patient.   Past Surgical History:  Procedure Laterality Date  . ABDOMINAL HYSTERECTOMY      Prior to Admission medications   Medication Sig Start Date End Date Taking? Authorizing Provider  albuterol (PROVENTIL HFA;VENTOLIN HFA) 108 (90 BASE) MCG/ACT inhaler Inhale 2 puffs into the lungs every 4 (four) hours as needed for wheezing or shortness of breath. 10/28/15   Cuthriell, Delorise Royals, PA-C  cetirizine (ZYRTEC) 10 MG tablet Take 10 mg by mouth daily.    [provider]  cyclobenzaprine (FLEXERIL) 5 MG tablet Take 1-2 tablets 3 times daily as needed 04/19/18   Enid Derry, PA-C  diazepam (VALIUM) 2 MG tablet Take 1 tablet (2 mg total) by mouth every 8 (eight) hours as needed for muscle spasms. 03/14/16   Tommi Rumps, PA-C  fluticasone (FLONASE) 50 MCG/ACT nasal spray Place 1 spray into both nostrils 2 (two) times daily. 09/28/16   Cuthriell, Delorise Royals, PA-C  HYDROcodone-acetaminophen (NORCO/VICODIN) 5-325 MG  tablet Take 1 tablet by mouth every 4 (four) hours as needed for moderate pain. Patient not taking: Reported on 03/26/2017 03/14/16   Tommi Rumps, PA-C  ibuprofen (ADVIL,MOTRIN) 600 MG tablet Take 1 tablet (600 mg total) by mouth every 6 (six) hours as needed. 04/19/18   Enid Derry, PA-C  magic mouthwash w/lidocaine SOLN Take 5 mLs by mouth 4 (four) times daily. Patient not taking: Reported on 03/26/2017 09/28/16   Cuthriell, Delorise Royals, PA-C  naproxen (NAPROSYN) 500 MG tablet Take 1 tablet (500 mg total) by mouth 2 (two) times daily with a meal. Patient not taking: Reported on 12/11/2017 09/03/16   Cuthriell, Delorise Royals, PA-C  oxyCODONE-acetaminophen (PERCOCET) 5-325 MG tablet Take 1 tablet by mouth every 4 (four) hours as needed for severe pain. 04/19/18 04/19/19  Enid Derry, PA-C  phenazopyridine (PYRIDIUM) 95 MG tablet Take 1 tablet (95 mg total) by mouth 3 (three) times daily as needed for pain. Patient not taking: Reported on 03/26/2017 09/03/16   Cuthriell, Delorise Royals, PA-C  traMADol (ULTRAM) 50 MG tablet Take 1 tablet (50 mg total) by mouth every 6 (six) hours as needed. Patient not taking: Reported on 03/26/2017 02/26/14   Roxy Horseman, PA-C  traZODone (DESYREL) 50 MG tablet Take 50 mg by mouth at bedtime.    [provider]    Allergies Patient has no known allergies.  No family history on file.  Social History Social History   Tobacco Use  . Smoking status: Never Smoker  . Smokeless tobacco:  Never Used  Substance Use Topics  . Alcohol use: No  . Drug use: No     Review of Systems  Cardiovascular: No chest pain. Respiratory: No SOB. Gastrointestinal: No abdominal pain.  No nausea, no vomiting.   Musculoskeletal: Positive for neck and shoulder pain. Skin: Negative for rash, lacerations, ecchymosis.  Positive for abrasions.  Neurological: Negative for  numbness or tingling   ____________________________________________   PHYSICAL EXAM:  VITAL  SIGNS: ED Triage Vitals  Enc Vitals Group     BP 04/19/18 1647 (!) 114/93     Pulse Rate 04/19/18 1647 (!) 104     Resp --      Temp 04/19/18 1647 99 F (37.2 C)     Temp Source 04/19/18 1647 Oral     SpO2 --      Weight --      Height --      Head Circumference --      Peak Flow --      Pain Score 04/19/18 1649 6     Pain Loc --      Pain Edu? --      Excl. in GC? --      Constitutional: Alert and oriented. Well appearing and in no acute distress. Eyes: Conjunctivae are normal. PERRL. EOMI. Head: Atraumatic. ENT:      Ears:      Nose: No congestion/rhinnorhea.      Mouth/Throat: Mucous membranes are moist.  Neck: No stridor. Mild diffuse tenderness to palpation over posterior neck. Cardiovascular: Normal rate, regular rhythm.  Good peripheral circulation. Respiratory: Normal respiratory effort without tachypnea or retractions. Lungs CTAB. Good air entry to the bases with no decreased or absent breath sounds. Gastrointestinal: Bowel sounds 4 quadrants. Soft and nontender to palpation. No guarding or rigidity. No palpable masses. No distention.  Musculoskeletal: Full range of motion to all extremities. No gross deformities appreciated. Neurologic:  Normal speech and language. No gross focal neurologic deficits are appreciated.  Skin:  Skin is warm, dry and intact.  Shallow abrasions to right arm. Psychiatric: Mood and affect are normal. Speech and behavior are normal. Patient exhibits appropriate insight and judgement.   ____________________________________________   LABS (all labs ordered are listed, but only abnormal results are displayed)  Labs Reviewed - No data to display ____________________________________________  EKG   ____________________________________________  RADIOLOGY Lexine Baton, personally viewed and evaluated these images (plain radiographs) as part of my medical decision making, as well as reviewing the written report by the  radiologist.  Dg Shoulder Right  Result Date: 04/19/2018 CLINICAL DATA:  Right shoulder pain after fall. EXAM: RIGHT SHOULDER - 2+ VIEW COMPARISON:  None. FINDINGS: No acute fracture or dislocation. Mild acromioclavicular and glenohumeral joint space narrowing. Bone mineralization is normal. Soft tissues are unremarkable. IMPRESSION: 1.  No acute osseous abnormality. 2. Mild acromioclavicular and glenohumeral osteoarthritis. Electronically Signed   By: Obie Dredge M.D.   On: 04/19/2018 17:41   Ct Head Wo Contrast  Result Date: 04/19/2018 CLINICAL DATA:  Fall with head back and neck pain EXAM: CT HEAD WITHOUT CONTRAST CT CERVICAL SPINE WITHOUT CONTRAST TECHNIQUE: Multidetector CT imaging of the head and cervical spine was performed following the standard protocol without intravenous contrast. Multiplanar CT image reconstructions of the cervical spine were also generated. COMPARISON:  03/14/2016, CT 03/25/2013, 11/19/2011 FINDINGS: CT HEAD FINDINGS Brain: No acute territorial infarction, hemorrhage or intracranial mass is visualized. The ventricles are nonenlarged. Vascular: No hyperdense vessels.  No unexpected calcification Skull:  Normal. Negative for fracture or focal lesion. Sinuses/Orbits: Mild mucosal thickening in the ethmoid sinuses. No acute orbital abnormality Other: None CT CERVICAL SPINE FINDINGS Alignment: Straightening of the cervical spine. No subluxation. Facet alignment within normal limits. Skull base and vertebrae: No acute fracture. No primary bone lesion or focal pathologic process. Soft tissues and spinal canal: No prevertebral fluid or swelling. No visible canal hematoma. Disc levels:  Within normal limits Upper chest: Negative. Questionable hypodense nodule in the left lobe of thyroid. Other: None IMPRESSION: 1. Negative non contrasted CT appearance of the brain 2. Straightening of the cervical spine. No definite acute osseous abnormality. Electronically Signed   By: Jasmine Pang  M.D.   On: 04/19/2018 17:52   Ct Cervical Spine Wo Contrast  Result Date: 04/19/2018 CLINICAL DATA:  Fall with head back and neck pain EXAM: CT HEAD WITHOUT CONTRAST CT CERVICAL SPINE WITHOUT CONTRAST TECHNIQUE: Multidetector CT imaging of the head and cervical spine was performed following the standard protocol without intravenous contrast. Multiplanar CT image reconstructions of the cervical spine were also generated. COMPARISON:  03/14/2016, CT 03/25/2013, 11/19/2011 FINDINGS: CT HEAD FINDINGS Brain: No acute territorial infarction, hemorrhage or intracranial mass is visualized. The ventricles are nonenlarged. Vascular: No hyperdense vessels.  No unexpected calcification Skull: Normal. Negative for fracture or focal lesion. Sinuses/Orbits: Mild mucosal thickening in the ethmoid sinuses. No acute orbital abnormality Other: None CT CERVICAL SPINE FINDINGS Alignment: Straightening of the cervical spine. No subluxation. Facet alignment within normal limits. Skull base and vertebrae: No acute fracture. No primary bone lesion or focal pathologic process. Soft tissues and spinal canal: No prevertebral fluid or swelling. No visible canal hematoma. Disc levels:  Within normal limits Upper chest: Negative. Questionable hypodense nodule in the left lobe of thyroid. Other: None IMPRESSION: 1. Negative non contrasted CT appearance of the brain 2. Straightening of the cervical spine. No definite acute osseous abnormality. Electronically Signed   By: Jasmine Pang M.D.   On: 04/19/2018 17:52    ____________________________________________    PROCEDURES  Procedure(s) performed:    Procedures    Medications  oxyCODONE-acetaminophen (PERCOCET/ROXICET) 5-325 MG per tablet 1 tablet (1 tablet Oral Given 04/19/18 1853)     ____________________________________________   INITIAL IMPRESSION / ASSESSMENT AND PLAN / ED COURSE  Pertinent labs & imaging results that were available during my care of the patient  were reviewed by me and considered in my medical decision making (see chart for details).  Review of the [place] CSRS was performed in accordance of the [facility] prior to dispensing any controlled drugs.   Patient presents to emergency department for evaluation after fall.  Vital signs and exam are reassuring.  CT head negative for acute abnormalities.  CT cervical spine consistent with previous.  No acute abnormalities on shoulder x-ray.  Patient will be discharged home with prescriptions for ibuprofen, Flexeril, short course of Percocet. Patient is to follow up with PCP as directed. Patient is given ED precautions to return to the ED for any worsening or new symptoms.     ____________________________________________  FINAL CLINICAL IMPRESSION(S) / ED DIAGNOSES  Final diagnoses:  Fall, initial encounter      NEW MEDICATIONS STARTED DURING THIS VISIT:  ED Discharge Orders        Ordered    ibuprofen (ADVIL,MOTRIN) 600 MG tablet  Every 6 hours PRN     04/19/18 1820    cyclobenzaprine (FLEXERIL) 5 MG tablet     04/19/18 1820    oxyCODONE-acetaminophen (PERCOCET) 5-325  MG tablet  Every 4 hours PRN     04/19/18 1820          This chart was dictated using voice recognition software/Dragon. Despite best efforts to proofread, errors can occur which can change the meaning. Any change was purely unintentional.    Enid DerryWagner, Demoni Parmar, PA-C 04/19/18 2009    Willy Eddyobinson, Patrick, MD 04/19/18 2103

## 2018-10-19 ENCOUNTER — Other Ambulatory Visit: Payer: Self-pay | Admitting: Nurse Practitioner

## 2018-10-19 DIAGNOSIS — Z1231 Encounter for screening mammogram for malignant neoplasm of breast: Secondary | ICD-10-CM

## 2018-11-03 ENCOUNTER — Ambulatory Visit (INDEPENDENT_AMBULATORY_CARE_PROVIDER_SITE_OTHER): Payer: BLUE CROSS/BLUE SHIELD | Admitting: Podiatry

## 2018-11-03 ENCOUNTER — Other Ambulatory Visit: Payer: Self-pay | Admitting: Podiatry

## 2018-11-03 ENCOUNTER — Ambulatory Visit (INDEPENDENT_AMBULATORY_CARE_PROVIDER_SITE_OTHER): Payer: BLUE CROSS/BLUE SHIELD

## 2018-11-03 ENCOUNTER — Encounter: Payer: Self-pay | Admitting: Podiatry

## 2018-11-03 DIAGNOSIS — M779 Enthesopathy, unspecified: Secondary | ICD-10-CM | POA: Diagnosis not present

## 2018-11-03 DIAGNOSIS — M79672 Pain in left foot: Secondary | ICD-10-CM

## 2018-11-03 DIAGNOSIS — S335XXA Sprain of ligaments of lumbar spine, initial encounter: Secondary | ICD-10-CM

## 2018-11-03 DIAGNOSIS — M67442 Ganglion, left hand: Secondary | ICD-10-CM | POA: Insufficient documentation

## 2018-11-03 DIAGNOSIS — M79671 Pain in right foot: Secondary | ICD-10-CM | POA: Diagnosis not present

## 2018-11-03 DIAGNOSIS — M5126 Other intervertebral disc displacement, lumbar region: Secondary | ICD-10-CM | POA: Insufficient documentation

## 2018-11-03 HISTORY — DX: Sprain of ligaments of lumbar spine, initial encounter: S33.5XXA

## 2018-11-03 MED ORDER — TRIAMCINOLONE ACETONIDE 10 MG/ML IJ SUSP
10.0000 mg | Freq: Once | INTRAMUSCULAR | Status: AC
  Administered 2018-11-03: 10 mg

## 2018-11-03 NOTE — Progress Notes (Signed)
Subjective:   Patient ID: Crystal Gibson, female   DOB: 47 y.o.   MRN: [id number]   HPI Patient presents stating having a lot of pain on top of both feet and I did fall in May and it seemed that my feet got bruised at that time and swelling occurred in its been painful since that.  Patient does not smoke and is recently been diagnosed with diabetes and does have a high A1c of 13.7 but is taking medicine and feels like it is coming down quite dramatically   Review of Systems  All other systems reviewed and are negative.       Objective:  Physical Exam  Constitutional: She appears well-developed and well-nourished.  Cardiovascular: Intact distal pulses.  Pulmonary/Chest: Effort normal.  Musculoskeletal: Normal range of motion.  Neurological: She is alert.  Skin: Skin is warm.  Nursing note and vitals reviewed.   Neurovascular status was intact muscle strength adequate range of motion within normal limits with patient found to have dorsal tendinitis midfoot distal with mild swelling noted.  Patient does have a small area on the lateral side of the right foot from her injury but it is local with no erythema edema or drainage associated with it and was found to have good digital perfusion and is well oriented x3     Assessment:  Dorsal tendinitis bilateral which may be due to the injury with a contusion of the right lateral foot that appears to be healing normally     Plan:  H&P x-rays reviewed.  I discussed dorsal injection explaining the procedure and risk and patient wants to have this done and I did careful dorsal injection 3 mg Kenalog 5 Milgram Xylocaine advised her to keep a closer look on her sugar for the next couple of days and use ice under protection to both feet.  X-rays indicate that there is no signs of fracture or indications of pathology of the midfoot bilateral

## 2018-11-18 ENCOUNTER — Ambulatory Visit
Admission: RE | Admit: 2018-11-18 | Discharge: 2018-11-18 | Disposition: A | Discharge status: Home / Self Care | Payer: BLUE CROSS/BLUE SHIELD | Source: Ambulatory Visit | Attending: Nurse Practitioner | Admitting: Nurse Practitioner

## 2018-11-18 DIAGNOSIS — Z1231 Encounter for screening mammogram for malignant neoplasm of breast: Secondary | ICD-10-CM | POA: Insufficient documentation

## 2018-12-01 ENCOUNTER — Ambulatory Visit: Payer: BLUE CROSS/BLUE SHIELD | Admitting: Podiatry

## 2018-12-06 ENCOUNTER — Ambulatory Visit: Payer: BLUE CROSS/BLUE SHIELD | Admitting: Podiatry

## 2019-08-03 ENCOUNTER — Other Ambulatory Visit: Payer: Self-pay

## 2019-08-03 ENCOUNTER — Encounter: Payer: Self-pay | Admitting: Podiatry

## 2019-08-03 ENCOUNTER — Ambulatory Visit: Payer: 59 | Admitting: Podiatry

## 2019-08-03 ENCOUNTER — Other Ambulatory Visit: Payer: Self-pay | Admitting: Podiatry

## 2019-08-03 ENCOUNTER — Ambulatory Visit (INDEPENDENT_AMBULATORY_CARE_PROVIDER_SITE_OTHER): Payer: 59

## 2019-08-03 DIAGNOSIS — M779 Enthesopathy, unspecified: Secondary | ICD-10-CM

## 2019-08-03 DIAGNOSIS — M79672 Pain in left foot: Secondary | ICD-10-CM

## 2019-08-03 DIAGNOSIS — M79671 Pain in right foot: Secondary | ICD-10-CM

## 2019-08-03 NOTE — Progress Notes (Signed)
Subjective:   Patient ID: Crystal Gibson, female   DOB: 48 y.o.   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  Today I did do sterile prep of each foot injected the dorsal tendon complex bilateral 3 mg Kenalog 5 mg Xylocaine and reappoint for routine care  X-rays indicate significant reduction of the arch bilateral with dorsal spurring noted bilateral indicating some form of osteoarthritis signed this

## 2019-08-26 ENCOUNTER — Telehealth: Payer: Self-pay | Admitting: Podiatry

## 2019-08-26 NOTE — Telephone Encounter (Signed)
Left message for pt to call to discuss orthotic benefits. Covered @ 70 % after deductible.Marland Kitchen

## 2019-09-25 ENCOUNTER — Encounter (HOSPITAL_COMMUNITY): Payer: Self-pay | Admitting: Emergency Medicine

## 2019-09-25 ENCOUNTER — Emergency Department (HOSPITAL_COMMUNITY): Payer: 59

## 2019-09-25 ENCOUNTER — Other Ambulatory Visit: Payer: Self-pay

## 2019-09-25 ENCOUNTER — Emergency Department (HOSPITAL_COMMUNITY)
Admission: EM | Admit: 2019-09-25 | Discharge: 2019-09-26 | Disposition: A | Discharge status: Home / Self Care | Payer: 59 | Attending: Emergency Medicine | Admitting: Emergency Medicine

## 2019-09-25 DIAGNOSIS — Z79899 Other long term (current) drug therapy: Secondary | ICD-10-CM | POA: Insufficient documentation

## 2019-09-25 DIAGNOSIS — R079 Chest pain, unspecified: Secondary | ICD-10-CM | POA: Insufficient documentation

## 2019-09-25 DIAGNOSIS — J45909 Unspecified asthma, uncomplicated: Secondary | ICD-10-CM | POA: Diagnosis not present

## 2019-09-25 DIAGNOSIS — E119 Type 2 diabetes mellitus without complications: Secondary | ICD-10-CM | POA: Insufficient documentation

## 2019-09-25 DIAGNOSIS — I10 Essential (primary) hypertension: Secondary | ICD-10-CM | POA: Diagnosis not present

## 2019-09-25 LAB — BASIC METABOLIC PANEL
Anion gap: 12 (ref 5–15)
BUN: 12 mg/dL (ref 6–20)
CO2: 26 mmol/L (ref 22–32)
Calcium: 9.7 mg/dL (ref 8.9–10.3)
Chloride: 98 mmol/L (ref 98–111)
Creatinine, Ser: 0.89 mg/dL (ref 0.44–1.00)
GFR calc Af Amer: >60 mL/min (ref >60)
GFR calc non Af Amer: >60 mL/min (ref >60)
Glucose, Bld: 330 mg/dL — ABNORMAL HIGH (ref 70–99)
Potassium: 3.8 mmol/L (ref 3.5–5.1)
Sodium: 136 mmol/L (ref 135–145)

## 2019-09-25 LAB — TROPONIN I (HIGH SENSITIVITY): Troponin I (High Sensitivity): 4 ng/L (ref <18)

## 2019-09-25 LAB — CBC
HCT: 39.4 % (ref 36.0–46.0)
Hemoglobin: 14.3 g/dL (ref 12.0–15.0)
MCH: 31.8 pg (ref 26.0–34.0)
MCHC: 36.3 g/dL — ABNORMAL HIGH (ref 30.0–36.0)
MCV: 87.8 fL (ref 80.0–100.0)
Platelets: 264 10*3/uL (ref 150–400)
RBC: 4.49 MIL/uL (ref 3.87–5.11)
RDW: 13 % (ref 11.5–15.5)
WBC: 4.2 10*3/uL (ref 4.0–10.5)
nRBC: 0 % (ref 0.0–0.2)

## 2019-09-25 MED ORDER — SODIUM CHLORIDE 0.9% FLUSH: 3.0000 mL | Freq: Once | INTRAVENOUS | Status: DC

## 2019-09-25 NOTE — ED Triage Notes (Signed)
Patient reports chronic intermittent central chest pain since May this year with mild SOB and nausea today , patient also requesting prescription refill for her Metropolol- she ran out last week . Denies diaphoresis or cough .

## 2019-09-26 MED ORDER — METOPROLOL TARTRATE 50 MG PO TABS: 50.0000 mg | ORAL_TABLET | Freq: Every day | ORAL | 0 refills | Status: DC

## 2019-09-26 MED ORDER — METOPROLOL TARTRATE 25 MG PO TABS
50.0000 mg | ORAL_TABLET | Freq: Once | ORAL | Status: AC
  Administered 2019-09-26: 50 mg via ORAL
  Filled 2019-09-26: qty 2

## 2019-09-26 NOTE — ED Provider Notes (Signed)
[facility] EMERGENCY DEPARTMENT Provider Note   CSN: [id number] Arrival date & time: 09/25/19  0712     History   Chief Complaint Chief Complaint  Patient presents with  . Chest Pain    HPI Crystal Gibson is a 48 y.o. female.     The history is provided by the patient and medical records.  Chest Pain    48 y.o. F with hx of asthma, allergies, DM, HTN, HLP, chronic pain, presenting to the ED for chest pain.  States this has been an intermittent issue for her since May 2020.  She was admitted to [facility] at that time with negative work-up including EKG, labs, CXR, TEE, and stress echocardiogram were all normal.  States she does follow with cardiologist in [place], [place].  She reports she continues having episodes of chest pain, described as sharp, stabbing, but also dull and aching at the same time.  She is unable to specify how long this lasts or how often this occurs.  States it just comes on randomly, has not identified any alleviating or exacerbating factors aside from stress.  She does report her children have some personal issues lately (OCD, eating disorder, ODD, etc) as well doing e-learning, aging parents with medical problems, etc.  She also has not taken her BP meds in about 4 days.  States her sister was supposed to pick it up from her at the pharmacy on Friday (3 days ago) but did not get to it yet.    Past Medical History:  Diagnosis Date  . Asthma   . Environmental allergies   . Gestational diabetes     Patient Active Problem List   Diagnosis Date Noted  . Ganglion cyst of tendon sheath of left hand 11/03/2018  . Displacement of lumbar intervertebral disc without myelopathy 11/03/2018  . Lumbar sprain 11/03/2018  . Sprain of ligament of tarsometatarsal joint 04/17/2016  . Health examination of defined subpopulation 09/14/2014    Past Surgical History:  Procedure Laterality Date  . ABDOMINAL HYSTERECTOMY    . OOPHORECTOMY       OB History    No obstetric history on file.      Home Medications    Prior to Admission medications   Medication Sig Start Date End Date Taking? Authorizing Provider  ACCU-CHEK FASTCLIX LANCETS Pope  11/01/18   [provider]  albuterol (PROVENTIL HFA;VENTOLIN HFA) 108 (90 BASE) MCG/ACT inhaler Inhale 2 puffs into the lungs every 4 (four) hours as needed for wheezing or shortness of breath. 10/28/15   Cuthriell, [name], PA-C  atorvastatin (LIPITOR) 20 MG tablet  11/01/18   [provider]  Blood Glucose Monitoring Suppl (ACCU-CHEK GUIDE) w/Device KIT USE DEVICE TO CHECK SUGARS DAILY 05/16/19   [provider]  cetirizine (ZYRTEC) 10 MG tablet Take 10 mg by mouth daily.    [provider]  cyclobenzaprine (FLEXERIL) 5 MG tablet Take 1-2 tablets 3 times daily as needed 04/19/18   Laban Emperor, PA-C  diazepam (VALIUM) 2 MG tablet Take 1 tablet (2 mg total) by mouth every 8 (eight) hours as needed for muscle spasms. 03/14/16   Johnn Hai, PA-C  DULoxetine (CYMBALTA) 60 MG capsule TK ONE C PO QD 09/01/18   [provider]  etodolac (LODINE) 400 MG tablet etodolac 400 mg tablet  TK 1 T PO BID    [provider]  famciclovir (FAMVIR) 500 MG tablet famciclovir 500 mg tablet  TK 1 T PO BID  [provider]  fluticasone (FLONASE) 50 MCG/ACT nasal spray Place 1 spray into both nostrils 2 (two) times daily. 09/28/16   Cuthriell, [name], PA-C  glipiZIDE (GLUCOTROL XL) 5 MG 24 hr tablet  11/01/18   [provider]  HYDROcodone-acetaminophen (NORCO/VICODIN) 5-325 MG tablet Take 1 tablet by mouth every 4 (four) hours as needed for moderate pain. 03/14/16   Johnn Hai, PA-C  ibuprofen (ADVIL,MOTRIN) 600 MG tablet Take 1 tablet (600 mg total) by mouth every 6 (six) hours as needed. 04/19/18   Laban Emperor, PA-C  ISOtretinoin (ABSORICA) 30 MG capsule Absorica 30 mg capsule  TK 1 C PO QD    [provider]  ketoconazole  (NIZORAL) 2 % shampoo ketoconazole 2 % shampoo  APPLY TO SCALP ONCE WEEKLY AND LET SIT SEVERAL MINUTES BEFORE RINSING    [provider]  lidocaine (XYLOCAINE) 2 % solution Lidocaine Viscous 2 % mucosal solution  TAKE 5 MILLILITERS BY MOUTH 3 TIMES A DAY BEFORE MEALS WILL 10 MILLILITERS OF PETRIACTIN    [provider]  magic mouthwash w/lidocaine SOLN Take 5 mLs by mouth 4 (four) times daily. 09/28/16   Cuthriell, [name], PA-C  meperidine (DEMEROL) 50 MG tablet meperidine 50 mg tablet  TK 1 OR 2 TS PO Q 4 TO 6 H PRF PAIN    [provider]  Na Sulfate-K Sulfate-Mg Sulf (SUPREP BOWEL PREP KIT) 17.5-3.13-1.6 GM/177ML SOLN Suprep Bowel Prep Kit 17.5 gram-3.13 gram-1.6 gram oral solution  U UTD    [provider]  naproxen (NAPROSYN) 500 MG tablet Take 1 tablet (500 mg total) by mouth 2 (two) times daily with a meal. 09/03/16   Cuthriell, [name], PA-C  olmesartan (BENICAR) 5 MG tablet  11/02/18   [provider]  phenazopyridine (PYRIDIUM) 95 MG tablet Take 1 tablet (95 mg total) by mouth 3 (three) times daily as needed for pain. 09/03/16   Cuthriell, [name], PA-C  traMADol (ULTRAM) 50 MG tablet Take 1 tablet (50 mg total) by mouth every 6 (six) hours as needed. 02/26/14   Montine Circle, PA-C  traZODone (DESYREL) 50 MG tablet Take 200 mg by mouth at bedtime.     [provider]  zolpidem (AMBIEN CR) 12.5 MG CR tablet zolpidem ER 12.5 mg tablet,extended release,multiphase  TK 1 T PO ONCE D HS PRN FOR INSOMNIA    [provider]    Family History Family History  Problem Relation Age of Onset  . Breast cancer Sister   . Breast cancer Maternal Aunt   . Breast cancer Paternal Aunt   . Breast cancer Maternal Grandmother   . Breast cancer Cousin     Social History Social History   Tobacco Use  . Smoking status: Never Smoker  . Smokeless tobacco: Never Used  Substance Use Topics  . Alcohol use: No  . Drug use: No      Allergies   Patient has no known allergies.   Review of Systems Review of Systems  Cardiovascular: Positive for chest pain.  All other systems reviewed and are negative.    Physical Exam Updated Vital Signs BP (!) 137/96 (BP Location: Right Arm)   Pulse 95   Temp 98.1 F (36.7 C) (Oral)   Resp 16   SpO2 97%   Physical Exam Vitals signs and nursing note reviewed.  Constitutional:      Appearance: She is well-developed.  HENT:     Head: Normocephalic and atraumatic.  Eyes:  Conjunctiva/sclera: Conjunctivae normal.     Pupils: Pupils are equal, round, and reactive to light.  Neck:     Musculoskeletal: Normal range of motion.     Comments: No JVD Cardiovascular:     Rate and Rhythm: Normal rate and regular rhythm.     Heart sounds: Normal heart sounds.  Pulmonary:     Effort: Pulmonary effort is normal.     Breath sounds: Normal breath sounds.  Abdominal:     General: Bowel sounds are normal.     Palpations: Abdomen is soft.  Musculoskeletal: Normal range of motion.     Comments: No peripheral edema  Skin:    General: Skin is warm and dry.  Neurological:     Mental Status: She is alert and oriented to person, place, and time.      ED Treatments / Results  Labs (all labs ordered are listed, but only abnormal results are displayed) Labs Reviewed  BASIC METABOLIC PANEL - Abnormal; Notable for the following components:      Result Value   Glucose, Bld 330 (*)    All other components within normal limits  CBC - Abnormal; Notable for the following components:   MCHC 36.3 (*)    All other components within normal limits  I-STAT BETA HCG BLOOD, ED ([facility], [facility], [facility] ONLY)  TROPONIN I (HIGH SENSITIVITY)  TROPONIN I (HIGH SENSITIVITY)    EKG None  Radiology Dg Chest 2 View  Result Date: 09/25/2019 CLINICAL DATA:  Chest pain, intermittent central chest pain with shortness of breath and nausea EXAM: CHEST - 2 VIEW COMPARISON:  Radiograph 04/09/2018 FINDINGS:  Accounting for body habitus, the lungs are clear. No consolidation, features of edema, pneumothorax, or effusion. Pulmonary vascularity is normally distributed. The cardiomediastinal contours are unremarkable. No acute osseous or soft tissue abnormality. IMPRESSION: No acute cardiopulmonary abnormality. Electronically Signed   By: Lovena Le M.D.   On: 09/25/2019 20:44    Procedures Procedures (including critical care time)  Medications Ordered in ED Medications  sodium chloride flush (NS) 0.9 % injection 3 mL (3 mLs Intravenous Not Given 09/26/19 0359)  metoprolol tartrate (LOPRESSOR) tablet 50 mg (50 mg Oral Given 09/26/19 0402)     Initial Impression / Assessment and Plan / ED Course  I have reviewed the triage vital signs and the nursing notes.  Pertinent labs & imaging results that were available during my care of the patient were reviewed by me and considered in my medical decision making (see chart for details).  48 year old female here with chest pain.  This is been intermittent since May of this year.  She was admitted at that time with negative work-up including EKG, chest x-ray, labs, TEE, as well as a stress echocardiogram.  She follows with a cardiologist in [place].  Pain described as sharp and stabbing, also dull and aching at the same time.  This seems random in onset but occasionally worse with stress.  She cannot describe how often this occurs or duration of time.  EKG here is overall nonischemic.  Labs are reassuring including negative troponin.  She is hyperglycemic, however reports eating ice cream prior to ER visit.  Chest x-ray is clear.  Patient symptoms are very atypical.  I have low suspicion for ACS, PE, dissection, other acute cardiac event at this time.  I feel she is stable for discharge home to follow-up with her cardiologist as well as her primary care doctor.  She has not yet picked up her blood pressure  medication from the pharmacy, she was given a dose here and  instructed to pick this up today.  I have given her a backup prescription if needed.  She may return here for any new/acute changes.  Final Clinical Impressions(s) / ED Diagnoses   Final diagnoses:  Chest pain in adult    ED Discharge Orders    None       Larene Pickett, PA-C 09/26/19 0436    Merrily Pew, MD 09/26/19 [phone]

## 2019-09-26 NOTE — Discharge Instructions (Signed)
Make sure to re-start your blood pressure medications.  You have already had dose for today so try and pick up meds sometime today and resume tomorrow. Follow-up with your cardiologist about the chest pain.   Return here for any new/acute changes.

## 2019-12-30 DIAGNOSIS — I319 Disease of pericardium, unspecified: Secondary | ICD-10-CM

## 2019-12-30 DIAGNOSIS — U071 COVID-19: Secondary | ICD-10-CM

## 2019-12-30 HISTORY — DX: Disease of pericardium, unspecified: I31.9

## 2019-12-30 HISTORY — DX: COVID-19: U07.1

## 2020-02-13 ENCOUNTER — Ambulatory Visit: Payer: 59 | Admitting: Dietician

## 2020-02-23 ENCOUNTER — Ambulatory Visit: Payer: 59 | Admitting: Skilled Nursing Facility1

## 2020-02-28 ENCOUNTER — Encounter: Payer: Self-pay | Admitting: Dietician

## 2020-02-28 NOTE — Progress Notes (Signed)
Have not heard back from patient after second missed MNT appointment on 02/23/20. Sent notification to referring provider.

## 2020-04-26 ENCOUNTER — Ambulatory Visit: Payer: 59 | Admitting: Dermatology

## 2020-06-27 ENCOUNTER — Ambulatory Visit: Payer: 59 | Admitting: Dermatology

## 2020-06-27 ENCOUNTER — Other Ambulatory Visit: Payer: Self-pay

## 2020-06-27 DIAGNOSIS — L03818 Cellulitis of other sites: Secondary | ICD-10-CM

## 2020-06-27 DIAGNOSIS — L659 Nonscarring hair loss, unspecified: Secondary | ICD-10-CM | POA: Diagnosis not present

## 2020-06-27 DIAGNOSIS — L700 Acne vulgaris: Secondary | ICD-10-CM

## 2020-06-27 MED ORDER — MUPIROCIN CALCIUM 2 % EX CREA: 1.0000 "application " | TOPICAL_CREAM | Freq: Two times a day (BID) | CUTANEOUS | 0 refills | Status: DC

## 2020-06-27 MED ORDER — DOXYCYCLINE HYCLATE 100 MG PO CAPS: 100.0000 mg | ORAL_CAPSULE | Freq: Two times a day (BID) | ORAL | 0 refills | Status: AC

## 2020-06-27 NOTE — Patient Instructions (Signed)
Discussed condition. Advised patient could be stress related vs hormone changes vs heredity vs recent illness or hospitalization. She has not be sick or hospitalized lately.  Discussed Hair Max or Capilia vs platelet rich plasma therapy vs hair transfusion (Dr. Jacqualyn Posey).  Recommend starting Biotin daily.There is a brand name Appearex.

## 2020-06-27 NOTE — Progress Notes (Signed)
   Follow-Up Visit   Subjective  Crystal Gibson is a 49 y.o. female who presents for the following: Acne ("Massive breakout of face". Using [place] face wash. Discuss Isotretinoin) and Other (Hair loss x ~1 year.).  The following portions of the chart were reviewed this encounter and updated as appropriate:  Tobacco  Allergies  Meds  Problems  Med Hx  Surg Hx  Fam Hx     Review of Systems:  No other skin or systemic complaints except as noted in HPI or Assessment and Plan.  Objective  Well appearing patient in no apparent distress; mood and affect are within normal limits.  A focused examination was performed including scalp, face, chest. Relevant physical exam findings are noted in the Assessment and Plan.  Objective  Scalp: Hair thinning of scalp.  Images        Objective  Face: Hyperpigmentation, scarring and multiple active papules of face, neck.  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 Advised patient could be stress related vs hormone changes vs heredity vs recent illness or hospitalization. She has not be sick or hospitalized lately.  Discussed Hair Max or Capilia Low lever light treatment for scalp vs platelet rich plasma therapy/ injections vs hair transplatation (Dr. Jacqualyn Posey).  Recommend starting Biotin and Rogaine daily.There is a brand name Appearex.  Will request a copy of last labs from PCP.  Consider biopsy in near future after review of lab.  Acne vulgaris Face  Discussed Isotretinoin. She has had a full hysterectomy. Ipledge consent form signed. Will plan to start Isotretinoin once labs are reviewed. Patient registered in Tab program. Ipledge # [id number].  Cellulitis of other specified site Right chest  doxycycline (VIBRAMYCIN) 100 MG capsule - Right chest  mupirocin  cream (BACTROBAN) 2 % - Right chest  Return in about 1 month (around 07/27/2020).   I, Joanie Coddington, CMA, am acting as scribe for Armida Sans, MD .  Documentation: I have reviewed the above documentation for accuracy and completeness, and I agree with the above.  Armida Sans, MD

## 2020-06-28 ENCOUNTER — Other Ambulatory Visit: Payer: Self-pay

## 2020-06-28 MED ORDER — MUPIROCIN 2 % EX OINT: 1.0000 "application " | TOPICAL_OINTMENT | Freq: Two times a day (BID) | CUTANEOUS | 0 refills | Status: DC

## 2020-06-28 NOTE — Progress Notes (Signed)
Changed medication for insurance purposes.

## 2020-07-10 ENCOUNTER — Encounter: Payer: Self-pay | Admitting: Dermatology

## 2020-08-01 ENCOUNTER — Ambulatory Visit: Payer: 59 | Admitting: Dermatology

## 2020-11-05 ENCOUNTER — Other Ambulatory Visit: Payer: Self-pay

## 2020-11-05 ENCOUNTER — Ambulatory Visit: Payer: BC Managed Care – PPO | Admitting: Podiatry

## 2020-11-05 ENCOUNTER — Encounter: Payer: Self-pay | Admitting: Podiatry

## 2020-11-05 ENCOUNTER — Ambulatory Visit (INDEPENDENT_AMBULATORY_CARE_PROVIDER_SITE_OTHER): Payer: BC Managed Care – PPO

## 2020-11-05 DIAGNOSIS — M7751 Other enthesopathy of right foot: Secondary | ICD-10-CM

## 2020-11-05 DIAGNOSIS — M25879 Other specified joint disorders, unspecified ankle and foot: Secondary | ICD-10-CM

## 2020-11-05 DIAGNOSIS — M25871 Other specified joint disorders, right ankle and foot: Secondary | ICD-10-CM

## 2020-11-05 HISTORY — DX: Other specified joint disorders, unspecified ankle and foot: M25.879

## 2020-11-05 MED ORDER — MELOXICAM 15 MG PO TABS: 15.0000 mg | ORAL_TABLET | Freq: Every day | ORAL | 1 refills | Status: DC
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
## 2020-12-10 ENCOUNTER — Ambulatory Visit: Payer: BC Managed Care – PPO | Admitting: Podiatry

## 2021-01-10 ENCOUNTER — Other Ambulatory Visit: Payer: Self-pay | Admitting: Podiatry

## 2021-01-10 DIAGNOSIS — M25871 Other specified joint disorders, right ankle and foot: Secondary | ICD-10-CM

## 2021-02-11 DIAGNOSIS — I10 Essential (primary) hypertension: Secondary | ICD-10-CM | POA: Diagnosis not present

## 2021-02-11 DIAGNOSIS — E1165 Type 2 diabetes mellitus with hyperglycemia: Secondary | ICD-10-CM | POA: Diagnosis not present

## 2021-02-11 DIAGNOSIS — U071 COVID-19: Secondary | ICD-10-CM | POA: Diagnosis not present

## 2021-02-18 ENCOUNTER — Ambulatory Visit: Payer: Self-pay | Admitting: Dermatology

## 2021-03-06 ENCOUNTER — Encounter: Payer: Self-pay | Admitting: Primary Care

## 2021-03-06 ENCOUNTER — Ambulatory Visit (INDEPENDENT_AMBULATORY_CARE_PROVIDER_SITE_OTHER): Payer: Self-pay | Admitting: Primary Care

## 2021-03-06 ENCOUNTER — Other Ambulatory Visit: Payer: Self-pay

## 2021-03-06 ENCOUNTER — Other Ambulatory Visit
Admission: RE | Admit: 2021-03-06 | Discharge: 2021-03-06 | Disposition: A | Discharge status: Home / Self Care | Payer: Self-pay | Source: Ambulatory Visit | Attending: Primary Care | Admitting: Primary Care

## 2021-03-06 VITALS — BP 124/72 | HR 96 | Temp 97.3°F | Ht 68.0 in | Wt 217.0 lb

## 2021-03-06 DIAGNOSIS — U071 COVID-19: Secondary | ICD-10-CM

## 2021-03-06 DIAGNOSIS — G4733 Obstructive sleep apnea (adult) (pediatric): Secondary | ICD-10-CM | POA: Insufficient documentation

## 2021-03-06 DIAGNOSIS — R0602 Shortness of breath: Secondary | ICD-10-CM | POA: Insufficient documentation

## 2021-03-06 DIAGNOSIS — R0600 Dyspnea, unspecified: Secondary | ICD-10-CM

## 2021-03-06 DIAGNOSIS — B948 Sequelae of other specified infectious and parasitic diseases: Secondary | ICD-10-CM | POA: Insufficient documentation

## 2021-03-06 DIAGNOSIS — U099 Post covid-19 condition, unspecified: Secondary | ICD-10-CM

## 2021-03-06 DIAGNOSIS — J1282 Pneumonia due to coronavirus disease 2019: Secondary | ICD-10-CM | POA: Insufficient documentation

## 2021-03-06 DIAGNOSIS — I2694 Multiple subsegmental pulmonary emboli without acute cor pulmonale: Secondary | ICD-10-CM

## 2021-03-06 DIAGNOSIS — J9611 Chronic respiratory failure with hypoxia: Secondary | ICD-10-CM | POA: Insufficient documentation

## 2021-03-06 LAB — CBC WITH DIFFERENTIAL/PLATELET
Abs Immature Granulocytes: 0 10*3/uL (ref 0.00–0.07)
Basophils Absolute: 0 10*3/uL (ref 0.0–0.1)
Basophils Relative: 0 %
Eosinophils Absolute: 0 10*3/uL (ref 0.0–0.5)
Eosinophils Relative: 0 %
HCT: 38.6 % (ref 36.0–46.0)
Hemoglobin: 13.5 g/dL (ref 12.0–15.0)
Immature Granulocytes: 0 %
Lymphocytes Relative: 51 %
Lymphs Abs: 1.8 10*3/uL (ref 0.7–4.0)
MCH: 30.8 pg (ref 26.0–34.0)
MCHC: 35 g/dL (ref 30.0–36.0)
MCV: 88.1 fL (ref 80.0–100.0)
Monocytes Absolute: 0.2 10*3/uL (ref 0.1–1.0)
Monocytes Relative: 6 %
Neutro Abs: 1.5 10*3/uL — ABNORMAL LOW (ref 1.7–7.7)
Neutrophils Relative %: 43 %
Platelets: 298 10*3/uL (ref 150–400)
RBC: 4.38 MIL/uL (ref 3.87–5.11)
RDW: 13.3 % (ref 11.5–15.5)
WBC: 3.5 10*3/uL — ABNORMAL LOW (ref 4.0–10.5)
nRBC: 0 % (ref 0.0–0.2)

## 2021-03-06 LAB — COMPREHENSIVE METABOLIC PANEL
ALT: 27 U/L (ref 0–44)
AST: 18 U/L (ref 15–41)
Albumin: 4.4 g/dL (ref 3.5–5.0)
Alkaline Phosphatase: 61 U/L (ref 38–126)
Anion gap: 8 (ref 5–15)
BUN: 9 mg/dL (ref 6–20)
CO2: 27 mmol/L (ref 22–32)
Calcium: 9.6 mg/dL (ref 8.9–10.3)
Chloride: 102 mmol/L (ref 98–111)
Creatinine, Ser: 0.56 mg/dL (ref 0.44–1.00)
GFR, Estimated: >60 mL/min (ref >60)
Glucose, Bld: 267 mg/dL — ABNORMAL HIGH (ref 70–99)
Potassium: 4.2 mmol/L (ref 3.5–5.1)
Sodium: 137 mmol/L (ref 135–145)
Total Bilirubin: 0.7 mg/dL (ref 0.3–1.2)
Total Protein: 8.3 g/dL — ABNORMAL HIGH (ref 6.5–8.1)

## 2021-03-06 LAB — BRAIN NATRIURETIC PEPTIDE: B Natriuretic Peptide: 4.5 pg/mL (ref 0.0–100.0)

## 2021-03-06 MED ORDER — ADVAIR HFA 115-21 MCG/ACT IN AERO: 2.0000 | INHALATION_SPRAY | Freq: Two times a day (BID) | RESPIRATORY_TRACT | 2 refills | Status: DC

## 2021-03-06 MED ORDER — PREDNISONE 10 MG PO TABS: ORAL_TABLET | ORAL | 0 refills | Status: AC

## 2021-03-06 NOTE — Progress Notes (Signed)
@Patient  ID: Crystal Gibson, female    DOB: June 12, 1971, 50 y.o.   MRN: [id number]  Chief Complaint  Patient presents with  . sleep consult    Hx of covid--required admission. Prior sleep study--not wearing cpap.     Referring provider: [name], NP  HPI: 50 year old female, never smoked.  Past medical history Covid-19 pneumonia(Dec 2021), pulmonary embolism, poorly controlled type 2 diabetes, hypertension.  Admitted outside facility 01/21/21-01/28/2021 for multiple segmental pulmonary emboli without acute cor pulmonale and acute hypoxic respiratory failure due to recent Covid pneumonia.  She was previously hospitalized for Covid pneumonia from 12/25/2020-01/08/2021.  She was discharged on home oxygen primarily for comfort.  She was given first dose of either vaccine on 01/04/2021.  Patient enrolled in research study for placebo versus apixaban 2.5 mg p.o. twice daily for VTE prophylactics after Covid admission.  Patient returned to hospital on 01/22/2021 with progressive shortness of breath and ongoing hypoxemia.  CTA chest showed multiple right lower lobe segmental PE felt to be due to hypercoagulability from recent COVID-19 infection.  No evidence of right heart strain on TTE.  Started on Eliquis with plan for 3 to 21-monthtreatment.  Maintained on supplemental oxygen as patient continued of desaturations with exertion due to resolving Covid pneumonia and acute PE.  Discharged with home health PT/OT/RN.  03/06/2021 Patient presents today for sleep/pulmonary consults. Referred by CEvern Bio NP for post covid shortness of breath on oxygen. 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 She has two children ages 89and 89 She has hx OSA, diagnosed > 5 years ago. DME company is [employer]. She has not worn CPAP since getting covid in December 2021. Needs new machine, very old. Previously tolerating CPAP fine.   She has residual shortness of breath since having Covid.  She reports fatigue, weakness, bilateral chest/neck/head/back pain and joint pain. She lost her sense of taste. She experiences shortness of breath symptoms at rest and on exertion. She gets winded speaking. She has mild cough with clear mucus. No hemoptysis since Feb 14th. She is on 2L oxygen at rest and 4L on exertion with portable concentrator. She is currently on 528mEliquis BID for pulmonary emboli. She saw Dr. KaChancy [facility]roy[facility] and had CTA two weeks ago that showed she still had PE and pneumonia. These records are not available in Epic, we have requested them for review. She tells me that she was prescribed abx but her PCP told her to hold off. She is on Advair twice a day. Using Albuterol as needed on average 3 times a day with exertion. She had a 6MWT on 02/26/21 , O2 79% RA and required 4L on exertion. She has follow-up with BoCharlynn GrimesNP March 15th. She has follow up mid April with Dr. KaChancy Milroy  Sleep questionnaire: Symptoms- Loud snoring, choking, restless sleep, daytime sleepiness Prior sleep study- > 5 years ago, not on file Bedtime- 9-10am Nocturnal awakenings-5-10 times Out of bed in morning- 5pm Weight- up 20 lbs  Epworth - 15/24 [place] Allergies  Allergen Reactions  . Glucophage [Metformin] Nausea And Vomiting    Immunization History  Administered Date(s) Administered  . Influenza,inj,Quad PF,6+ Mos 01/07/2021  . Influenza-Unspecified 09/28/2020  . PFIZER(Purple Top)SARS-COV-2 Vaccination 01/04/2021, 01/28/2021  . Pneumococcal Conjugate-13 01/06/2021    Past Medical History:  Diagnosis Date  . Asthma   . Environmental allergies   . Gestational diabetes     Tobacco History: Social History  Tobacco Use  Smoking Status Never Smoker  Smokeless Tobacco Never Used   Counseling given: Not Answered   Outpatient Medications Prior to Visit  Medication Sig Dispense Refill  . ACCU-CHEK FASTCLIX LANCETS MISC   2  . albuterol (PROVENTIL HFA;VENTOLIN HFA)  108 (90 BASE) MCG/ACT inhaler Inhale 2 puffs into the lungs every 4 (four) hours as needed for wheezing or shortness of breath. 1 Inhaler 0  . atorvastatin (LIPITOR) 20 MG tablet   1  . Blood Glucose Monitoring Suppl (ACCU-CHEK GUIDE) w/Device KIT USE DEVICE TO CHECK SUGARS DAILY    . cetirizine (ZYRTEC) 10 MG tablet Take 10 mg by mouth daily.    . chlorthalidone (HYGROTON) 25 MG tablet Take 25 mg by mouth daily.    . cyclobenzaprine (FLEXERIL) 5 MG tablet Take 1-2 tablets 3 times daily as needed 20 tablet 0  . DULoxetine (CYMBALTA) 60 MG capsule TK ONE C PO QD  0  . etodolac (LODINE) 400 MG tablet etodolac 400 mg tablet  TK 1 T PO BID    . famciclovir (FAMVIR) 500 MG tablet famciclovir 500 mg tablet  TK 1 T PO BID    . famotidine (PEPCID) 20 MG tablet Take 20 mg by mouth daily.    . fluticasone (FLONASE) 50 MCG/ACT nasal spray Place 1 spray into both nostrils 2 (two) times daily. 16 g 0  . ibuprofen (ADVIL,MOTRIN) 600 MG tablet Take 1 tablet (600 mg total) by mouth every 6 (six) hours as needed. 30 tablet 0  . Insulin NPH, Human,, Isophane, (HUMULIN N KWIKPEN) 100 UNIT/ML Kiwkpen Inject 20 Units into the skin in the morning and at bedtime.    Marland Kitchen ketoconazole (NIZORAL) 2 % shampoo ketoconazole 2 % shampoo  APPLY TO SCALP ONCE WEEKLY AND LET SIT SEVERAL MINUTES BEFORE RINSING    . lidocaine (XYLOCAINE) 2 % solution Lidocaine Viscous 2 % mucosal solution  TAKE 5 MILLILITERS BY MOUTH 3 TIMES A DAY BEFORE MEALS WILL 10 MILLILITERS OF PETRIACTIN    . meloxicam (MOBIC) 15 MG tablet Take 1 tablet (15 mg total) by mouth daily. 15 tablet 1  . metoprolol tartrate (LOPRESSOR) 50 MG tablet Take 1 tablet (50 mg total) by mouth daily. 30 tablet 0  . mupirocin cream (BACTROBAN) 2 % Apply 1 application topically 2 (two) times daily. 15 g 0  . mupirocin ointment (BACTROBAN) 2 % Apply 1 application topically 2 (two) times daily. 22 g 0  . Na Sulfate-K Sulfate-Mg Sulf 17.5-3.13-1.6 GM/177ML SOLN Suprep Bowel Prep  Kit 17.5 gram-3.13 gram-1.6 gram oral solution  U UTD    . NEXLIZET 180-10 MG TABS Take 1 tablet by mouth daily.    Marland Kitchen olmesartan (BENICAR) 5 MG tablet   1  . traZODone (DESYREL) 50 MG tablet Take 200 mg by mouth at bedtime.     . Vitamin D, Ergocalciferol, (DRISDOL) 1.25 MG (50000 UNIT) CAPS capsule Vitamin D (Ergocalciferol) 1.25 MG (50000 UT) Oral Capsule QTY: 6 capsule Days: 42 Refills: 0  Written: 09/29/19 Patient Instructions: Take 1 tablet once weekly for 6 weeks and recheck    . zolpidem (AMBIEN CR) 12.5 MG CR tablet zolpidem ER 12.5 mg tablet,extended release,multiphase  TK 1 T PO ONCE D HS PRN FOR INSOMNIA    . diazepam (VALIUM) 2 MG tablet Take 1 tablet (2 mg total) by mouth every 8 (eight) hours as needed for muscle spasms. 9 tablet 0  . magic mouthwash w/lidocaine SOLN Take 5 mLs by mouth 4 (four) times daily.  240 mL 0  . naproxen (NAPROSYN) 500 MG tablet Take 1 tablet (500 mg total) by mouth 2 (two) times daily with a meal. 60 tablet 0  . traMADol (ULTRAM) 50 MG tablet Take 1 tablet (50 mg total) by mouth every 6 (six) hours as needed. 15 tablet 0  . FARXIGA 10 MG TABS tablet Take 10 mg by mouth daily. (Patient not taking: Reported on 03/06/2021)    . glipiZIDE (GLUCOTROL XL) 5 MG 24 hr tablet  (Patient not taking: Reported on 03/06/2021)  1  . HUMALOG KWIKPEN 100 UNIT/ML KwikPen 5 Units 3 (three) times daily.    . ISOtretinoin (ACCUTANE) 30 MG capsule Absorica 30 mg capsule  TK 1 C PO QD (Patient not taking: Reported on 03/06/2021)    . meperidine (DEMEROL) 50 MG tablet meperidine 50 mg tablet  TK 1 OR 2 TS PO Q 4 TO 6 H PRF PAIN (Patient not taking: Reported on 03/06/2021)    . phenazopyridine (PYRIDIUM) 95 MG tablet Take 1 tablet (95 mg total) by mouth 3 (three) times daily as needed for pain. (Patient not taking: Reported on 03/06/2021) 6 tablet 0  . HYDROcodone-acetaminophen (NORCO/VICODIN) 5-325 MG tablet Take 1 tablet by mouth every 4 (four) hours as needed for moderate pain. (Patient  not taking: Reported on 03/06/2021) 20 tablet 0   No facility-administered medications prior to visit.   Review of Systems  Review of Systems  Constitutional: Negative.   HENT: Negative.   Respiratory: Positive for cough and shortness of breath. Negative for chest tightness and wheezing.   Cardiovascular: Negative.   Musculoskeletal: Positive for arthralgias and back pain.  Skin:       Itching    Physical Exam  BP 124/72 (BP Location: Left Arm, Cuff Size: Normal)   Pulse 96   Temp (!) 97.3 F (36.3 C) (Temporal)   Ht 5' 8"  (1.727 m)   Wt 217 lb (98.4 kg)   SpO2 98%   BMI 32.99 kg/m  Physical Exam Constitutional:      Appearance: Normal appearance.  HENT:     Head: Normocephalic and atraumatic.  Cardiovascular:     Rate and Rhythm: Normal rate and regular rhythm.  Pulmonary:     Effort: Pulmonary effort is normal.     Breath sounds: Normal breath sounds.     Comments: Diminished, poor effort. 2L oxygen  Musculoskeletal:        General: Normal range of motion.  Skin:    General: Skin is warm and dry.  Neurological:     General: No focal deficit present.     Mental Status: She is alert and oriented to person, place, and time. Mental status is at baseline.  Psychiatric:        Mood and Affect: Mood normal.        Behavior: Behavior normal.        Thought Content: Thought content normal.        Judgment: Judgment normal.      Lab Results:  CBC    Component Value Date/Time   WBC 3.5 (L) 03/06/2021 1119   RBC 4.38 03/06/2021 1119   HGB 13.5 03/06/2021 1119   HGB 14.0 07/21/2014 0832   HCT 38.6 03/06/2021 1119   HCT 42.3 07/21/2014 0832   PLT 298 03/06/2021 1119   PLT 274 07/21/2014 0832   MCV 88.1 03/06/2021 1119   MCV 93 07/21/2014 0832   MCH 30.8 03/06/2021 1119   MCHC 35.0 03/06/2021 1119  RDW 13.3 03/06/2021 1119   RDW 13.4 07/21/2014 0832   LYMPHSABS 1.8 03/06/2021 1119   LYMPHSABS 1.7 07/21/2014 0832   MONOABS 0.2 03/06/2021 1119   MONOABS 0.2  07/21/2014 0832   EOSABS 0.0 03/06/2021 1119   EOSABS 0.0 07/21/2014 0832   BASOSABS 0.0 03/06/2021 1119   BASOSABS 0.0 07/21/2014 0832    BMET    Component Value Date/Time   NA 137 03/06/2021 1119   NA 141 07/21/2014 0832   K 4.2 03/06/2021 1119   K 4.0 07/21/2014 0832   CL 102 03/06/2021 1119   CL 105 07/21/2014 0832   CO2 27 03/06/2021 1119   CO2 29 07/21/2014 0832   GLUCOSE 267 (H) 03/06/2021 1119   GLUCOSE 148 (H) 07/21/2014 0832   BUN 9 03/06/2021 1119   BUN 8 07/21/2014 0832   CREATININE 0.56 03/06/2021 1119   CREATININE 0.89 07/21/2014 0832   CALCIUM 9.6 03/06/2021 1119   CALCIUM 8.8 07/21/2014 0832   GFRNONAA >60 03/06/2021 1119   GFRNONAA >60 07/21/2014 0832   GFRAA >60 09/25/2019 1943   GFRAA >60 07/21/2014 0832    BNP    Component Value Date/Time   BNP 4.5 03/06/2021 1119    ProBNP No results found for: PROBNP  Imaging: No results found.   Assessment & Plan:   Persistent shortness of breath after COVID-19 - Hospitalized in December 2021 for covid-19 pneumonia, not vaccinated. Residual shortness of breath with new oxygen requirements - Continue Advair two puffs twice daily  - RX prednisone taper 100m x 5 days; 12mx 5 days;1067m 5 days; 5mg21m5 days (lower dose recommended d/t hx diabetes)  Chronic respiratory failure with hypoxia (HCC) - Due to covid pneumonia and acute pulmonary emboli - Requiring 2L oxygen at rest and 4L on exertion  Multiple subsegmental pulmonary emboli without acute cor pulmonale (HCC) - Continue Eliquis 5mg 69mce daily, will need at least 3-6 month treatment course  - Requesting recent CTA results from [facility] to review   OSA (obstructive sleep apnea) - Symptoms loud snoring, apnea, restless sleep, daytime sleepiness. Epworth 15/24 - Needs split night sleep study d/t hx severe OSA and new oxygen requirements   FU 4 weeks/ first available with Dr. GonzaGuadlupe Spanish3/08/2021

## 2021-03-06 NOTE — Progress Notes (Signed)
Please let patient know her labs looked ok. BNP was nromal. WBC was low d/t covid most likely. Glucose was elevated 267. Kidney function normal.

## 2021-03-06 NOTE — Assessment & Plan Note (Addendum)
-   Hospitalized in December 2021 for covid-19 pneumonia, not vaccinated. Residual shortness of breath with new oxygen requirements - Continue Advair two puffs twice daily  - RX prednisone taper 20mg  x 5 days; 15mg  x 5 days;10mg  x 5 days; 5mg  x 5 days (lower dose recommended d/t hx diabetes)

## 2021-03-06 NOTE — Assessment & Plan Note (Signed)
-   Due to covid pneumonia and acute pulmonary emboli - Requiring 2L oxygen at rest and 4L on exertion

## 2021-03-06 NOTE — Progress Notes (Signed)
Reviewed and agree with assessment/plan.   Kalem Rockwell, MD [facility] 03/06/2021, 3:19 PM Pager:  [phone]  

## 2021-03-06 NOTE — Assessment & Plan Note (Addendum)
-   Continue Eliquis 5mg  twice daily, will need at least 3-6 month treatment course  - Requesting recent CTA results from [facility] to review

## 2021-03-06 NOTE — Patient Instructions (Addendum)
Recommendations: Continue Advair two puffs morning and evening (rinse mouth after use) Use albuterol 2 puffs every 4-6 hours as needed for shortness of breath/wheezing Sending in prednisone taper Continue Eliquis 5mg  twice daily  Hold off on abx until we gets results and can review  Hold off Nexlizet, PCP or cardiology should address cholesterol medications   Orders: Labs today (CMET, CBC, BNP) Split night sleep study re: OSA  Request CTA results, labs and notes from [facility]   Refer: Pulmonary rehab  Follow-up: First available with Dr. 

## 2021-03-06 NOTE — Assessment & Plan Note (Addendum)
-   Symptoms loud snoring, apnea, restless sleep, daytime sleepiness. Epworth 15/24 - Needs split night sleep study d/t hx severe OSA and new oxygen requirements

## 2021-03-07 ENCOUNTER — Telehealth: Payer: Self-pay | Admitting: Primary Care

## 2021-03-07 NOTE — Telephone Encounter (Signed)
   Call returned to patient, confirmed DOB. Made of lab results. Voiced understanding. Nothing further needed at this time.

## 2021-03-20 ENCOUNTER — Encounter: Payer: Self-pay | Attending: Pulmonary Disease

## 2021-03-20 ENCOUNTER — Other Ambulatory Visit: Payer: Self-pay

## 2021-03-20 DIAGNOSIS — U071 COVID-19: Secondary | ICD-10-CM | POA: Insufficient documentation

## 2021-03-20 DIAGNOSIS — R0600 Dyspnea, unspecified: Secondary | ICD-10-CM

## 2021-03-20 DIAGNOSIS — J1282 Pneumonia due to coronavirus disease 2019: Secondary | ICD-10-CM | POA: Insufficient documentation

## 2021-03-20 NOTE — Progress Notes (Signed)
Virtual Visit completed. Patient informed on EP and RD appointment and 6 Minute walk test. Patient also informed of patient health questionnaires on My Chart. Patient Verbalizes understanding. Visit diagnosis can be found in [facility] 03/06/2021.

## 2021-03-25 ENCOUNTER — Other Ambulatory Visit: Payer: Self-pay

## 2021-03-25 VITALS — Ht 68.0 in | Wt 222.7 lb

## 2021-03-25 DIAGNOSIS — R0600 Dyspnea, unspecified: Secondary | ICD-10-CM
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
## 2021-03-25 NOTE — Patient Instructions (Signed)
Patient Instructions  Patient Details  Name: Crystal Gibson MRN: [id number] Date of Birth: 04-12-1971 Referring Provider:  Salena Saner, MD  Below are your personal goals for exercise, nutrition, and risk factors. Our goal is to help you stay on track towards obtaining and maintaining these goals. We will be discussing your progress on these goals with you throughout the program.  Initial Exercise Prescription:  Initial Exercise Prescription - 03/25/21 1100      Date of Initial Exercise RX and Referring Provider   Date 03/25/21    Referring Provider Jayme Cloud      Oxygen   Oxygen Continuous    Liters 4      Treadmill   MPH 1.5    Grade 0.5    Minutes 15    METs 2.25      Recumbant Bike   Level 1    RPM 60    Minutes 15    METs 2.25      NuStep   Level 2    SPM 80    Minutes 15    METs 2.25      Arm Ergometer   Level 1    RPM 25    Minutes 15    METs 2.25      Prescription Details   Frequency (times per week) 3      Intensity   THRR 40-80% of Max Heartrate 125-156    Ratings of Perceived Exertion 11-15    Perceived Dyspnea 0-4      Resistance Training   Training Prescription Yes    Weight 3 lb    Reps 10-15           Exercise Goals: Frequency: Be able to perform aerobic exercise two to three times per week in program working toward 2-5 days per week of home exercise.  Intensity: Work with a perceived exertion of 11 (fairly light) - 15 (hard) while following your exercise prescription.  We will make changes to your prescription with you as you progress through the program.   Duration: Be able to do 30 to 45 minutes of continuous aerobic exercise in addition to a 5 minute warm-up and a 5 minute cool-down routine.   Nutrition Goals: Your personal nutrition goals will be established when you do your nutrition analysis with the dietician.  The following are general nutrition guidelines to follow: Cholesterol < 200mg /day Sodium < 1500mg /day Fiber:  Women under 50 yrs - 25 grams per day  Personal Goals:  Personal Goals and Risk Factors at Admission - 03/25/21 1208      Core Components/Risk Factors/Patient Goals on Admission    Weight Management Yes;Weight Loss    Intervention Weight Management: Develop a combined nutrition and exercise program designed to reach desired caloric intake, while maintaining appropriate intake of nutrient and fiber, sodium and fats, and appropriate energy expenditure required for the weight goal.;Weight Management: Provide education and appropriate resources to help participant work on and attain dietary goals.;Weight Management/Obesity: Establish reasonable short term and long term weight goals.;Obesity: Provide education and appropriate resources to help participant work on and attain dietary goals.    Expected Outcomes Short Term: Continue to assess and modify interventions until short term weight is achieved;Long Term: Adherence to nutrition and physical activity/exercise program aimed toward attainment of established weight goal;Understanding recommendations for meals to include 15-35% energy as protein, 25-35% energy from fat, 35-60% energy from carbohydrates, less than 200mg  of dietary cholesterol, 20-35 gm of total fiber daily;Weight Loss: Understanding  of general recommendations for a balanced deficit meal plan, which promotes 1-2 lb weight loss per week and includes a negative energy balance of [phone] kcal/d;Understanding of distribution of calorie intake throughout the day with the consumption of 4-5 meals/snacks    Improve shortness of breath with ADL's Yes    Intervention Provide education, individualized exercise plan and daily activity instruction to help decrease symptoms of SOB with activities of daily living.    Expected Outcomes Short Term: Improve cardiorespiratory fitness to achieve a reduction of symptoms when performing ADLs;Long Term: Be able to perform more ADLs without symptoms or delay the  onset of symptoms    Diabetes Yes    Intervention Provide education about signs/symptoms and action to take for hypo/hyperglycemia.;Provide education about proper nutrition, including hydration, and aerobic/resistive exercise prescription along with prescribed medications to achieve blood glucose in normal ranges: Fasting glucose 65-99 mg/dL    Expected Outcomes Short Term: Participant verbalizes understanding of the signs/symptoms and immediate care of hyper/hypoglycemia, proper foot care and importance of medication, aerobic/resistive exercise and nutrition plan for blood glucose control.;Long Term: Attainment of HbA1C < 7%.    Hypertension Yes    Intervention Provide education on lifestyle modifcations including regular physical activity/exercise, weight management, moderate sodium restriction and increased consumption of fresh fruit, vegetables, and low fat dairy, alcohol moderation, and smoking cessation.;Monitor prescription use compliance.    Expected Outcomes Short Term: Continued assessment and intervention until BP is < 140/33mm HG in hypertensive participants. < 130/54mm HG in hypertensive participants with diabetes, heart failure or chronic kidney disease.;Long Term: Maintenance of blood pressure at goal levels.    Lipids Yes    Intervention Provide education and support for participant on nutrition & aerobic/resistive exercise along with prescribed medications to achieve LDL 70mg , HDL >40mg .    Expected Outcomes Short Term: Participant states understanding of desired cholesterol values and is compliant with medications prescribed. Participant is following exercise prescription and nutrition guidelines.;Long Term: Cholesterol controlled with medications as prescribed, with individualized exercise RX and with personalized nutrition plan. Value goals: LDL < 70mg , HDL > 40 mg.           Tobacco Use Initial Evaluation: Social History   Tobacco Use  Smoking Status Never Smoker  Smokeless  Tobacco Never Used    Exercise Goals and Review:  Exercise Goals    Row Name 03/25/21 1140             Exercise Goals   Increase Physical Activity Yes       Intervention Provide advice, education, support and counseling about physical activity/exercise needs.;Develop an individualized exercise prescription for aerobic and resistive training based on initial evaluation findings, risk stratification, comorbidities and participant's personal goals.       Expected Outcomes Short Term: Attend rehab on a regular basis to increase amount of physical activity.;Long Term: Add in home exercise to make exercise part of routine and to increase amount of physical activity.;Long Term: Exercising regularly at least 3-5 days a week.       Increase Strength and Stamina Yes       Intervention Provide advice, education, support and counseling about physical activity/exercise needs.;Develop an individualized exercise prescription for aerobic and resistive training based on initial evaluation findings, risk stratification, comorbidities and participant's personal goals.       Expected Outcomes Short Term: Increase workloads from initial exercise prescription for resistance, speed, and METs.;Short Term: Perform resistance training exercises routinely during rehab and add in resistance training at home;Long  Term: Improve cardiorespiratory fitness, muscular endurance and strength as measured by increased METs and functional capacity ( )       Able to understand and use rate of perceived exertion (RPE) scale Yes       Intervention Provide education and explanation on how to use RPE scale       Expected Outcomes Short Term: Able to use RPE daily in rehab to express subjective intensity level;Long Term:  Able to use RPE to guide intensity level when exercising independently       Able to understand and use Dyspnea scale Yes       Intervention Provide education and explanation on how to use Dyspnea scale        Expected Outcomes Short Term: Able to use Dyspnea scale daily in rehab to express subjective sense of shortness of breath during exertion;Long Term: Able to use Dyspnea scale to guide intensity level when exercising independently       Knowledge and understanding of Target Heart Rate Range (THRR) Yes       Intervention Provide education and explanation of THRR including how the numbers were predicted and where they are located for reference       Expected Outcomes Short Term: Able to state/look up THRR;Short Term: Able to use daily as guideline for intensity in rehab;Long Term: Able to use THRR to govern intensity when exercising independently       Able to check pulse independently Yes       Intervention Provide education and demonstration on how to check pulse in carotid and radial arteries.;Review the importance of being able to check your own pulse for safety during independent exercise       Expected Outcomes Short Term: Able to explain why pulse checking is important during independent exercise;Long Term: Able to check pulse independently and accurately       Understanding of Exercise Prescription Yes       Intervention Provide education, explanation, and written materials on patient's individual exercise prescription       Expected Outcomes Short Term: Able to explain program exercise prescription;Long Term: Able to explain home exercise prescription to exercise independently              Copy of goals given to participant.

## 2021-03-27 ENCOUNTER — Other Ambulatory Visit: Payer: Self-pay

## 2021-03-27 DIAGNOSIS — J1282 Pneumonia due to coronavirus disease 2019: Secondary | ICD-10-CM

## 2021-03-27 DIAGNOSIS — R0600 Dyspnea, unspecified: Secondary | ICD-10-CM

## 2021-03-27 LAB — GLUCOSE, CAPILLARY
Glucose-Capillary: 259 mg/dL — ABNORMAL HIGH (ref 70–99)
Glucose-Capillary: 269 mg/dL — ABNORMAL HIGH (ref 70–99)

## 2021-03-27 NOTE — Progress Notes (Signed)
Daily Session Note  Patient Details  Name: Crystal Gibson MRN: [id number] Date of Birth: [birth year] Referring Provider:   Flowsheet Row Pulmonary Rehab from 03/25/2021 in [facility] Cardiac and Pulmonary Rehab  Referring Provider [name]      Encounter Date: 03/27/2021  Check In:  Session Check In - 03/27/21 0810      Check-In   Supervising physician immediately available to respond to emergencies See telemetry face sheet for immediately available ER MD    Location [facility]-Cardiac & Pulmonary Rehab    Staff Present Birdie Sons, MPA, RN;Melissa Caiola RDN, Rowe Pavy, BA, ACSM CEP, Exercise Physiologist    Virtual Visit No    Medication changes reported     No    Fall or balance concerns reported    No    Warm-up and Cool-down Performed on first and last piece of equipment    Resistance Training Performed Yes    VAD Patient? No    PAD/SET Patient? No      Pain Assessment   Currently in Pain? No/denies              Social History   Tobacco Use  Smoking Status Never Smoker  Smokeless Tobacco Never Used    Goals Met:  Independence with exercise equipment Exercise tolerated well No report of cardiac concerns or symptoms Strength training completed today  Goals Unmet:  Not Applicable  Comments: First full day of exercise!  Patient was oriented to gym and equipment including functions, settings, policies, and procedures.  Patient's individual exercise prescription and treatment plan were reviewed.  All starting workloads were established based on the results of the 6 minute walk test done at initial orientation visit.  The plan for exercise progression was also introduced and progression will be customized based on patient's performance and goals.    Dr. Emily Filbert is Medical Director for [place] and [facility].

## 2021-03-28 NOTE — Progress Notes (Signed)
Received CTA results from [facility] that showed mild diffuse increase in interstitial markings consistent with covid pneumonia. No axillary, mediastinal or hilar mass or adenopathy. No filling defects identified in pulmonary arteries, segmental or subsegmental branches.

## 2021-03-29 ENCOUNTER — Other Ambulatory Visit: Payer: Self-pay

## 2021-03-29 ENCOUNTER — Encounter: Payer: No Typology Code available for payment source | Attending: Pulmonary Disease | Admitting: *Deleted

## 2021-03-29 DIAGNOSIS — J1282 Pneumonia due to coronavirus disease 2019: Secondary | ICD-10-CM | POA: Insufficient documentation

## 2021-03-29 DIAGNOSIS — U071 COVID-19: Secondary | ICD-10-CM | POA: Insufficient documentation

## 2021-03-29 DIAGNOSIS — R0600 Dyspnea, unspecified: Secondary | ICD-10-CM | POA: Diagnosis present

## 2021-03-29 LAB — GLUCOSE, CAPILLARY
Glucose-Capillary: 251 mg/dL — ABNORMAL HIGH (ref 70–99)
Glucose-Capillary: 244 mg/dL — ABNORMAL HIGH (ref 70–99)

## 2021-03-29 NOTE — Progress Notes (Signed)
Daily Session Note  Patient Details  Name: Crystal Gibson MRN: [id number] Date of Birth: 10/15/1971 Referring Provider:   Flowsheet Row Pulmonary Rehab from 03/25/2021 in [facility] Card[facility] Rehab  Referring Provider Gonzalez      Encounter Date: 03/29/2021  Check In:  Session Check In - 03/29/21 0957      Check-In   Supervising physician immediately available to respond to emergencies See telemetry face sheet for immediately available ER MD    Location [facility]iac & Pulmonary Rehab    Staff Present Susanne Bice, RN, BSN, CCRP;Jessica Hawkins, MA, RCEP, CCRP, CCET;Joseph Hood RCP,RRT,BSRT    Virtual Visit No    Medication changes reported     No    Fall or balance concerns reported    No    Warm-up and Cool-down Performed on first and last piece of equipment    Resistance Training Performed Yes    VAD Patient? No    PAD/SET Patient? No      Pain Assessment   Currently in Pain? No/denies              Social History   Tobacco Use  Smoking Status Never Smoker  Smokeless Tobacco Never Used    Goals Met:  Proper associated with RPD/PD & O2 Sat Independence with exercise equipment Exercise tolerated well No report of cardiac concerns or symptoms  Goals Unmet:  Not Applicable  Comments: Pt able to follow exercise prescription today without complaint.  Will continue to monitor for progression.    Dr. Mark Miller is Medical Director for [facility] and [facility]. 

## 2021-04-01 ENCOUNTER — Other Ambulatory Visit: Payer: Self-pay

## 2021-04-01 ENCOUNTER — Encounter: Payer: No Typology Code available for payment source | Admitting: *Deleted

## 2021-04-01 DIAGNOSIS — J1282 Pneumonia due to coronavirus disease 2019: Secondary | ICD-10-CM

## 2021-04-01 DIAGNOSIS — U071 COVID-19: Secondary | ICD-10-CM

## 2021-04-01 LAB — GLUCOSE, CAPILLARY
Glucose-Capillary: 220 mg/dL — ABNORMAL HIGH (ref 70–99)
Glucose-Capillary: 156 mg/dL — ABNORMAL HIGH (ref 70–99)

## 2021-04-01 NOTE — Progress Notes (Signed)
Completed initial RD evaluation 

## 2021-04-01 NOTE — Progress Notes (Signed)
Daily Session Note  Patient Details  Name: Crystal Gibson MRN: [id number] Date of Birth: September 28, 1971 Referring Provider:   Flowsheet Row Pulmonary Rehab from 03/25/2021 in [facility] Card[facility] and Pul[facility] Rehab  Referring Provider [name]      Encounter Date: 04/01/2021  Check In:  Session Check In - 04/01/21 0827      Check-In   Supervising physician immediately available to respond to emergencies See telemetry face sheet for immediately available ER MD    Location [facility]    Staff Present Heath Lark, RN, BSN, Laveda Norman, BS, ACSM CEP, Exercise Physiologist;Joseph Tessie Fass RCP,RRT,BSRT    Virtual Visit No    Medication changes reported     No    Fall or balance concerns reported    No    Warm-up and Cool-down Performed on first and last piece of equipment    Resistance Training Performed Yes    VAD Patient? No    PAD/SET Patient? No      Pain Assessment   Currently in Pain? No/denies              Social History   Tobacco Use  Smoking Status Never Smoker  Smokeless Tobacco Never Used    Goals Met:  Proper associated with RPD/PD & O2 Sat Independence with exercise equipment Exercise tolerated well No report of cardiac concerns or symptoms  Goals Unmet:  Not Applicable  Comments: Pt able to follow exercise prescription today without complaint.  Will continue to monitor for progression.    Dr. Emily Filbert is Medical Director for [place] and [facility].

## 2021-04-02 ENCOUNTER — Ambulatory Visit: Payer: Self-pay | Admitting: Pulmonary Disease

## 2021-04-03 ENCOUNTER — Other Ambulatory Visit: Payer: Self-pay

## 2021-04-03 DIAGNOSIS — R0600 Dyspnea, unspecified: Secondary | ICD-10-CM

## 2021-04-03 DIAGNOSIS — U071 COVID-19: Secondary | ICD-10-CM

## 2021-04-03 DIAGNOSIS — J1282 Pneumonia due to coronavirus disease 2019: Secondary | ICD-10-CM

## 2021-04-03 NOTE — Progress Notes (Signed)
Daily Session Note  Patient Details  Name: Crystal Gibson MRN: [id number] Date of Birth: 1971-08-30 Referring Provider:   Flowsheet Row Pulmonary Rehab from 03/25/2021 in [facility] Card[facility] and Pulmonary Rehab  Referring Provider [name]      Encounter Date: 04/03/2021  Check In:  Session Check In - 04/03/21 0807      Check-In   Supervising physician immediately available to respond to emergencies See telemetry face sheet for immediately available ER MD    Location [facility]    Staff Present Birdie Sons, MPA, Elveria Rising, BA, ACSM CEP, Exercise Physiologist;Joseph Tessie Fass RCP,RRT,BSRT    Virtual Visit No    Medication changes reported     No    Fall or balance concerns reported    No    Warm-up and Cool-down Performed on first and last piece of equipment    Resistance Training Performed Yes    VAD Patient? No    PAD/SET Patient? No      Pain Assessment   Currently in Pain? No/denies              Social History   Tobacco Use  Smoking Status Never Smoker  Smokeless Tobacco Never Used    Goals Met:  Independence with exercise equipment Exercise tolerated well No report of cardiac concerns or symptoms Strength training completed today  Goals Unmet:  Not Applicable  Comments: Pt able to follow exercise prescription today without complaint.  Will continue to monitor for progression.    Dr. Emily Filbert is Medical Director for [place] and [facility].

## 2021-04-08 ENCOUNTER — Encounter: Payer: No Typology Code available for payment source | Admitting: *Deleted

## 2021-04-08 ENCOUNTER — Other Ambulatory Visit: Payer: Self-pay

## 2021-04-08 DIAGNOSIS — U071 COVID-19: Secondary | ICD-10-CM | POA: Diagnosis not present

## 2021-04-08 DIAGNOSIS — J1282 Pneumonia due to coronavirus disease 2019: Secondary | ICD-10-CM

## 2021-04-08 NOTE — Progress Notes (Signed)
Daily Session Note  Patient Details  Name: Crystal Gibson MRN: [id number] Date of Birth: 12-20-1971 Referring Provider:   Flowsheet Row Pulmonary Rehab from 03/25/2021 in [facility] Cardiac and Pulmonary Rehab  Referring Provider [name]      Encounter Date: 04/08/2021  Check In:  Session Check In - 04/08/21 0837      Check-In   Supervising physician immediately available to respond to emergencies See telemetry face sheet for immediately available ER MD    Location [facility]    Staff Present Heath Lark, RN, BSN, CCRP;Joseph Hood RCP,RRT,BSRT;Kelly [place], [place], ACSM CEP, Exercise Physiologist    Virtual Visit No    Medication changes reported     No    Fall or balance concerns reported    No    Warm-up and Cool-down Performed on first and last piece of equipment    Resistance Training Performed Yes    VAD Patient? No    PAD/SET Patient? No      Pain Assessment   Currently in Pain? No/denies              Social History   Tobacco Use  Smoking Status Never Smoker  Smokeless Tobacco Never Used    Goals Met:  Proper associated with RPD/PD & O2 Sat Independence with exercise equipment Exercise tolerated well No report of cardiac concerns or symptoms  Goals Unmet:  Not Applicable  Comments: Pt able to follow exercise prescription today without complaint.  Will continue to monitor for progression.    Dr. Emily Filbert is Medical Director for [place] and [facility].

## 2021-04-10 ENCOUNTER — Other Ambulatory Visit: Payer: Self-pay

## 2021-04-10 DIAGNOSIS — J1282 Pneumonia due to coronavirus disease 2019: Secondary | ICD-10-CM

## 2021-04-10 DIAGNOSIS — U071 COVID-19: Secondary | ICD-10-CM | POA: Diagnosis not present

## 2021-04-10 NOTE — Progress Notes (Signed)
Daily Session Note  Patient Details  Name: Crystal Gibson MRN: [id number] Date of Birth: 1971-10-14 Referring Provider:   Flowsheet Row Pulmonary Rehab from 03/25/2021 in [facility] and Pulmonary Rehab  Referring Provider [name]      Encounter Date: 04/10/2021  Check In:  Session Check In - 04/10/21 0800      Check-In   Supervising physician immediately available to respond to emergencies See telemetry face sheet for immediately available ER MD    Location [facility]    Staff Present Birdie Sons, MPA, Elveria Rising, BA, ACSM CEP, Exercise Physiologist;Joseph Tessie Fass RCP,RRT,BSRT    Virtual Visit No    Medication changes reported     No    Fall or balance concerns reported    No    Warm-up and Cool-down Performed on first and last piece of equipment    Resistance Training Performed Yes    VAD Patient? No    PAD/SET Patient? No      Pain Assessment   Currently in Pain? No/denies              Social History   Tobacco Use  Smoking Status Never Smoker  Smokeless Tobacco Never Used    Goals Met:  Independence with exercise equipment Exercise tolerated well No report of cardiac concerns or symptoms Strength training completed today  Goals Unmet:  Not Applicable  Comments: Pt able to follow exercise prescription today without complaint.  Will continue to monitor for progression.    Dr. Emily Filbert is Medical Director for [place] and [facility].

## 2021-04-12 ENCOUNTER — Other Ambulatory Visit: Payer: Self-pay

## 2021-04-12 ENCOUNTER — Encounter: Payer: No Typology Code available for payment source | Admitting: *Deleted

## 2021-04-12 DIAGNOSIS — J1282 Pneumonia due to coronavirus disease 2019: Secondary | ICD-10-CM

## 2021-04-12 DIAGNOSIS — U071 COVID-19: Secondary | ICD-10-CM | POA: Diagnosis not present

## 2021-04-12 NOTE — Progress Notes (Signed)
Daily Session Note  Patient Details  Name: Crystal Gibson MRN: [id number] Date of Birth: [birth year] Referring Provider:   Flowsheet Row Pulmonary Rehab from 03/25/2021 in [facility] Cardiac and Pulmonary Rehab  Referring Provider [name]      Encounter Date: 04/12/2021  Check In:  Session Check In - 04/12/21 0846      Check-In   Supervising physician immediately available to respond to emergencies See telemetry face sheet for immediately available ER MD    Location [facility]    Staff Present Heath Lark, RN, BSN, CCRP;Melissa Caiola RDN, LDN;Joseph Hood RCP,RRT,BSRT    Virtual Visit No    Medication changes reported     No    Fall or balance concerns reported    No    Warm-up and Cool-down Performed on first and last piece of equipment    Resistance Training Performed Yes    VAD Patient? No    PAD/SET Patient? No      Pain Assessment   Currently in Pain? No/denies              Social History   Tobacco Use  Smoking Status Never Smoker  Smokeless Tobacco Never Used    Goals Met:  Proper associated with RPD/PD & O2 Sat Independence with exercise equipment Exercise tolerated well No report of cardiac concerns or symptoms  Goals Unmet:  Not Applicable  Comments: Pt able to follow exercise prescription today without complaint.  Will continue to monitor for progression.    Dr. Emily Filbert is Medical Director for [place] and [facility].

## 2021-04-13 DIAGNOSIS — E119 Type 2 diabetes mellitus without complications: Secondary | ICD-10-CM | POA: Diagnosis not present

## 2021-04-13 DIAGNOSIS — R0602 Shortness of breath: Secondary | ICD-10-CM | POA: Diagnosis not present

## 2021-04-13 DIAGNOSIS — R0600 Dyspnea, unspecified: Secondary | ICD-10-CM | POA: Diagnosis not present

## 2021-04-13 DIAGNOSIS — Z7901 Long term (current) use of anticoagulants: Secondary | ICD-10-CM | POA: Diagnosis not present

## 2021-04-13 DIAGNOSIS — R918 Other nonspecific abnormal finding of lung field: Secondary | ICD-10-CM | POA: Diagnosis not present

## 2021-04-13 DIAGNOSIS — R000 Tachycardia, unspecified: Secondary | ICD-10-CM | POA: Diagnosis not present

## 2021-04-13 DIAGNOSIS — R079 Chest pain, unspecified: Secondary | ICD-10-CM | POA: Diagnosis not present

## 2021-04-13 DIAGNOSIS — Z794 Long term (current) use of insulin: Secondary | ICD-10-CM | POA: Diagnosis not present

## 2021-04-13 DIAGNOSIS — R0789 Other chest pain: Secondary | ICD-10-CM | POA: Diagnosis not present

## 2021-04-13 DIAGNOSIS — R072 Precordial pain: Secondary | ICD-10-CM | POA: Diagnosis not present

## 2021-04-14 DIAGNOSIS — R072 Precordial pain: Secondary | ICD-10-CM | POA: Diagnosis not present

## 2021-04-14 DIAGNOSIS — E119 Type 2 diabetes mellitus without complications: Secondary | ICD-10-CM | POA: Diagnosis not present

## 2021-04-14 DIAGNOSIS — Z794 Long term (current) use of insulin: Secondary | ICD-10-CM | POA: Diagnosis not present

## 2021-04-14 DIAGNOSIS — R0600 Dyspnea, unspecified: Secondary | ICD-10-CM | POA: Diagnosis not present

## 2021-04-14 DIAGNOSIS — Z7901 Long term (current) use of anticoagulants: Secondary | ICD-10-CM | POA: Diagnosis not present

## 2021-04-14 DIAGNOSIS — R918 Other nonspecific abnormal finding of lung field: Secondary | ICD-10-CM | POA: Diagnosis not present

## 2021-04-15 ENCOUNTER — Telehealth: Payer: Self-pay | Admitting: Primary Care

## 2021-04-15 ENCOUNTER — Other Ambulatory Visit: Payer: Self-pay

## 2021-04-15 ENCOUNTER — Encounter: Payer: No Typology Code available for payment source | Admitting: *Deleted

## 2021-04-15 DIAGNOSIS — U071 COVID-19: Secondary | ICD-10-CM | POA: Diagnosis not present

## 2021-04-15 DIAGNOSIS — J1282 Pneumonia due to coronavirus disease 2019: Secondary | ICD-10-CM

## 2021-04-15 NOTE — Telephone Encounter (Signed)
[facility]'s appointments does not show in epic.  I have spoken to [facility] with sleepmed and confirmed that patient is scheduled for sleep study on 04/17/21 at 8:45p. Patient should bring comfy clothes and nighttime meds with her to this appt.   Patient is aware of above information and voiced her understanding.  Nothing further needed at this time.

## 2021-04-15 NOTE — Progress Notes (Signed)
Daily Session Note  Patient Details  Name: AMIREE NO MRN: [id number] Date of Birth: 03/13/1971 Referring Provider:   Flowsheet Row Pulmonary Rehab from 03/25/2021 in [facility] Card[facility] and Pulmonary Rehab  Referring Provider [name]      Encounter Date: 04/15/2021  Check In:  Session Check In - 04/15/21 0836      Check-In   Supervising physician immediately available to respond to emergencies See telemetry face sheet for immediately available ER MD    Location [facility]    Staff Present Heath Lark, RN, BSN, CCRP;Joseph Hood RCP,RRT,BSRT;Kelly [place], [place], ACSM CEP, Exercise Physiologist    Virtual Visit No    Medication changes reported     No    Fall or balance concerns reported    No    Warm-up and Cool-down Performed on first and last piece of equipment    Resistance Training Performed Yes    VAD Patient? No    PAD/SET Patient? No      Pain Assessment   Currently in Pain? No/denies              Social History   Tobacco Use  Smoking Status Never Smoker  Smokeless Tobacco Never Used    Goals Met:  Proper associated with RPD/PD & O2 Sat Independence with exercise equipment Exercise tolerated well No report of cardiac concerns or symptoms  Goals Unmet:  Not Applicable  Comments: Pt able to follow exercise prescription today without complaint.  Will continue to monitor for progression.    Dr. Emily Filbert is Medical Director for [place] and [facility].

## 2021-04-17 ENCOUNTER — Encounter: Payer: Self-pay | Admitting: *Deleted

## 2021-04-17 DIAGNOSIS — U071 COVID-19: Secondary | ICD-10-CM

## 2021-04-17 DIAGNOSIS — J1282 Pneumonia due to coronavirus disease 2019: Secondary | ICD-10-CM

## 2021-04-17 DIAGNOSIS — R0600 Dyspnea, unspecified: Secondary | ICD-10-CM

## 2021-04-17 NOTE — Progress Notes (Signed)
Pulmonary Individual Treatment Plan  Patient Details  Name: Crystal Gibson MRN: [id number] Date of Birth: 05-30-1971 Referring Provider:   Flowsheet Row Pulmonary Rehab from 03/25/2021 in [facility] Cardiac and Pulmonary Rehab  Referring Provider [name]      Initial Encounter Date:  Flowsheet Row Pulmonary Rehab from 03/25/2021 in [facility] Cardiac and Pulmonary Rehab  Date 03/25/21      Visit Diagnosis: Pneumonia due to COVID-19 virus  Dyspnea due to COVID-19  Patient's Home Medications on Admission:  Current Outpatient Medications:  .  ACCU-CHEK FASTCLIX LANCETS MISC, , Disp: , Rfl: 2 .  acetaminophen (TYLENOL) 500 MG tablet, Take by mouth., Disp: , Rfl:  .  albuterol (PROVENTIL HFA;VENTOLIN HFA) 108 (90 BASE) MCG/ACT inhaler, Inhale 2 puffs into the lungs every 4 (four) hours as needed for wheezing or shortness of breath., Disp: 1 Inhaler, Rfl: 0 .  apixaban (ELIQUIS) 5 MG TABS tablet, Take by mouth., Disp: , Rfl:  .  atorvastatin (LIPITOR) 20 MG tablet, , Disp: , Rfl: 1 .  BD PEN NEEDLE NANO 2ND GEN 32G X 4 MM MISC, USE THREE TIMES DAILY BEFORE MEALS, Disp: , Rfl:  .  Blood Glucose Monitoring Suppl (ACCU-CHEK GUIDE) w/Device KIT, USE DEVICE TO CHECK SUGARS DAILY, Disp: , Rfl:  .  butalbital-acetaminophen-caffeine (FIORICET) 50-325-40 MG tablet, Take by mouth., Disp: , Rfl:  .  butalbital-acetaminophen-caffeine (FIORICET) 50-325-40 MG tablet, Take 1 tablet by mouth every 4 (four) hours as needed. (Patient not taking: Reported on 03/20/2021), Disp: , Rfl:  .  cetirizine (ZYRTEC) 10 MG tablet, Take 10 mg by mouth daily. (Patient not taking: Reported on 03/20/2021), Disp: , Rfl:  .  Cetirizine HCl (ZYRTEC ALLERGY) 10 MG CAPS, Take by mouth., Disp: , Rfl:  .  chlorthalidone (HYGROTON) 25 MG tablet, Take 25 mg by mouth daily., Disp: , Rfl:  .  cyclobenzaprine (FLEXERIL) 5 MG tablet, Take 1-2 tablets 3 times daily as needed (Patient not taking: Reported on 03/20/2021), Disp: 20 tablet, Rfl: 0 .   DULoxetine (CYMBALTA) 60 MG capsule, TK ONE C PO QD, Disp: , Rfl: 0 .  etodolac (LODINE) 400 MG tablet, etodolac 400 mg tablet  TK 1 T PO BID (Patient not taking: Reported on 03/20/2021), Disp: , Rfl:  .  famciclovir (FAMVIR) 500 MG tablet, famciclovir 500 mg tablet  TK 1 T PO BID (Patient not taking: Reported on 03/20/2021), Disp: , Rfl:  .  famotidine (PEPCID) 20 MG tablet, Take 20 mg by mouth daily., Disp: , Rfl:  .  FARXIGA 10 MG TABS tablet, Take 10 mg by mouth daily. (Patient not taking: No sig reported), Disp: , Rfl:  .  fluconazole (DIFLUCAN) 150 MG tablet, fluconazole 150 mg tablet, Disp: , Rfl:  .  Fluocinolone Acetonide Body 0.01 % OIL, Derma-Smoothe/FS Scalp 0.01% External Oil QTY: 1 mL Days: 30 Refills: 5  Written: 01/16/21 Patient Instructions: Apply thin film to affected areas on scalp every 8 hours, Disp: , Rfl:  .  Fluocinolone Acetonide Scalp 0.01 % OIL, SMARTSIG:Sparingly Topical Every 8 Hours, Disp: , Rfl:  .  fluticasone (FLONASE) 50 MCG/ACT nasal spray, Place 1 spray into both nostrils 2 (two) times daily., Disp: 16 g, Rfl: 0 .  fluticasone (FLOVENT HFA) 110 MCG/ACT inhaler, Inhale into the lungs., Disp: , Rfl:  .  fluticasone-salmeterol (ADVAIR HFA) 115-21 MCG/ACT inhaler, Inhale 2 puffs into the lungs 2 (two) times daily., Disp: 1 each, Rfl: 2 .  glipiZIDE (GLUCOTROL XL) 5 MG 24 hr tablet, , Disp: ,  Rfl: 1 .  glucose blood (ACCU-CHEK GUIDE) test strip, Accu-Chek Guide In Vitro Strip QTY: 200 strip Days: 90 Refills: 1  Written: 04/27/20 Patient Instructions: TEST FASTING BLOOD SUGAR EVERY MORNING FASTING AND EVERY EVENING E11.65, Disp: , Rfl:  .  glucose blood ([employer] BLOOD GLUCOSE TEST) test strip, Use to check blood sugar as directed with insulin 3 times a day & for symptoms of high or low blood sugar., Disp: , Rfl:  .  glucose blood test strip, Accu-Chek Active In Vitro Strip QTY: 300 strip Days: 90 Refills: 3  Written: 04/06/19 Patient Instructions: Use with glucometer to check  blood sugars daily in AM fasting and prn for low/high blood sugars - E11.9, Disp: , Rfl:  .  glucose blood test strip, OneTouch Verio test strips, Disp: , Rfl:  .  HUMALOG KWIKPEN 100 UNIT/ML KwikPen, 5 Units 3 (three) times daily., Disp: , Rfl:  .  ibuprofen (ADVIL,MOTRIN) 600 MG tablet, Take 1 tablet (600 mg total) by mouth every 6 (six) hours as needed., Disp: 30 tablet, Rfl: 0 .  icosapent Ethyl (VASCEPA) 1 g capsule, Vascepa 1 GM Oral Capsule QTY: 120 capsule Days: 30 Refills: 0  Written: 11/09/20 Patient Instructions: Take 2 capsules by mouth daily in AM and 2 capsules in evening, need labs and appt, Disp: , Rfl:  .  insulin lispro (HUMALOG) 100 UNIT/ML KwikPen, Inject into the skin., Disp: , Rfl:  .  Insulin NPH, Human,, Isophane, (HUMULIN N KWIKPEN) 100 UNIT/ML Kiwkpen, Inject 20 Units into the skin in the morning and at bedtime., Disp: , Rfl:  .  Insulin Pen Needle (GLOBAL EASY GLIDE PEN NEEDLES) 32G X 4 MM MISC, Use for injections three (3) times a day before meals., Disp: , Rfl:  .  ISOtretinoin (ACCUTANE) 30 MG capsule, , Disp: , Rfl:  .  ketoconazole (NIZORAL) 2 % shampoo, ketoconazole 2 % shampoo  APPLY TO SCALP ONCE WEEKLY AND LET SIT SEVERAL MINUTES BEFORE RINSING, Disp: , Rfl:  .  Lancets Misc. (ACCU-CHEK FASTCLIX LANCET) KIT, Accu-Chek Fastclix Lancet Drum  USE TO CHECK FASTING IN AM AND AS NEEDED FOR HIGH OR LOW, Disp: , Rfl:  .  levofloxacin (LEVAQUIN) 500 MG tablet, Levaquin 500 MG Oral Tablet QTY: 10 tablet Days: 10 Refills: 0  Written: 02/11/21 Patient Instructions: once a day (Patient not taking: Reported on 03/20/2021), Disp: , Rfl:  .  lidocaine (XYLOCAINE) 2 % solution, Lidocaine Viscous 2 % mucosal solution  TAKE 5 MILLILITERS BY MOUTH 3 TIMES A DAY BEFORE MEALS WILL 10 MILLILITERS OF PETRIACTIN (Patient not taking: Reported on 03/20/2021), Disp: , Rfl:  .  meloxicam (MOBIC) 15 MG tablet, Take 1 tablet (15 mg total) by mouth daily., Disp: 15 tablet, Rfl: 1 .  meperidine  (DEMEROL) 50 MG tablet, meperidine 50 mg tablet  TK 1 OR 2 TS PO Q 4 TO 6 H PRF PAIN (Patient not taking: No sig reported), Disp: , Rfl:  .  metoprolol succinate (TOPROL-XL) 50 MG 24 hr tablet, Take by mouth., Disp: , Rfl:  .  metoprolol tartrate (LOPRESSOR) 50 MG tablet, Take 1 tablet (50 mg total) by mouth daily., Disp: 30 tablet, Rfl: 0 .  mupirocin cream (BACTROBAN) 2 %, Apply 1 application topically 2 (two) times daily., Disp: 15 g, Rfl: 0 .  mupirocin ointment (BACTROBAN) 2 %, Apply 1 application topically 2 (two) times daily., Disp: 22 g, Rfl: 0 .  Na Sulfate-K Sulfate-Mg Sulf 17.5-3.13-1.6 GM/177ML SOLN, Suprep Bowel Prep Kit 17.5 gram-3.13 gram-1.6  gram oral solution  U UTD (Patient not taking: Reported on 03/20/2021), Disp: , Rfl:  .  NEXLIZET 180-10 MG TABS, Take 1 tablet by mouth daily., Disp: , Rfl:  .  olmesartan (BENICAR) 5 MG tablet, , Disp: , Rfl: 1 .  omeprazole (PRILOSEC) 20 MG capsule, omeprazole 20 mg capsule,delayed release  TAKE 1 CAPSULE BY MOUTH EVERY DAY BEFORE MEAL, Disp: , Rfl:  .  ONETOUCH VERIO test strip, 1 each 4 (four) times daily., Disp: , Rfl:  .  OXYGEN, 3 L., Disp: , Rfl:  .  phenazopyridine (PYRIDIUM) 95 MG tablet, Take 1 tablet (95 mg total) by mouth 3 (three) times daily as needed for pain. (Patient not taking: No sig reported), Disp: 6 tablet, Rfl: 0 .  rosuvastatin (CRESTOR) 40 MG tablet, Take by mouth., Disp: , Rfl:  .  traZODone (DESYREL) 50 MG tablet, Take 200 mg by mouth at bedtime. , Disp: , Rfl:  .  Vitamin D, Ergocalciferol, (DRISDOL) 1.25 MG (50000 UNIT) CAPS capsule, Vitamin D (Ergocalciferol) 1.25 MG (50000 UT) Oral Capsule QTY: 6 capsule Days: 42 Refills: 0  Written: 09/29/19 Patient Instructions: Take 1 tablet once weekly for 6 weeks and recheck, Disp: , Rfl:  .  zolpidem (AMBIEN CR) 12.5 MG CR tablet, zolpidem ER 12.5 mg tablet,extended release,multiphase  TK 1 T PO ONCE D HS PRN FOR INSOMNIA, Disp: , Rfl:   Past Medical History: Past Medical  History:  Diagnosis Date  . Asthma   . Environmental allergies   . Gestational diabetes     Tobacco Use: Social History   Tobacco Use  Smoking Status Never Smoker  Smokeless Tobacco Never Used    Labs: Recent Review Scientist, physiological    Labs for ITP Cardiac and Pulmonary Rehab Latest Ref Rng & Units 03/25/2013   TCO2 0 - 100 mmol/L 29       Pulmonary Assessment Scores:  Pulmonary Assessment Scores    Row Name 03/25/21 1142         ADL UCSD   SOB Score total 98     Rest 2     Walk 3     Stairs 5     Bath 4     Dress 4     Shop 5           CAT Score   CAT Score 29           mMRC Score   mMRC Score 2            UCSD: Self-administered rating of dyspnea associated with activities of daily living (ADLs) 6-point scale (0 = "not at all" to 5 = "maximal or unable to do because of breathlessness")  Scoring Scores range from 0 to 120.  Minimally important difference is 5 units  CAT: CAT can identify the health impairment of COPD patients and is better correlated with disease progression.  CAT has a scoring range of zero to 40. The CAT score is classified into four groups of low (less than 10), medium (10 - 20), high (21-30) and very high (31-40) based on the impact level of disease on health status. A CAT score over 10 suggests significant symptoms.  A worsening CAT score could be explained by an exacerbation, poor medication adherence, poor inhaler technique, or progression of COPD or comorbid conditions.  CAT MCID is 2 points  mMRC: mMRC (Modified Medical Research Council) Dyspnea Scale is used to assess the degree of baseline functional disability in patients of  respiratory disease due to dyspnea. No minimal important difference is established. A decrease in score of 1 point or greater is considered a positive change.   Pulmonary Function Assessment:  Pulmonary Function Assessment - 03/20/21 1559      Breath   Shortness of Breath Yes;Limiting activity            Exercise Target Goals: Exercise Program Goal: Individual exercise prescription set using results from initial 6 min walk test and THRR while considering  patient's activity barriers and safety.   Exercise Prescription Goal: Initial exercise prescription builds to 30-45 minutes a day of aerobic activity, 2-3 days per week.  Home exercise guidelines will be given to patient during program as part of exercise prescription that the participant will acknowledge.  Education: Aerobic Exercise: - Group verbal and visual presentation on the components of exercise prescription. Introduces F.I.T.T principle from ACSM for exercise prescriptions.  Reviews F.I.T.T. principles of aerobic exercise including progression. Written material given at graduation.   Education: Resistance Exercise: - Group verbal and visual presentation on the components of exercise prescription. Introduces F.I.T.T principle from ACSM for exercise prescriptions  Reviews F.I.T.T. principles of resistance exercise including progression. Written material given at graduation.    Education: Exercise & Equipment Safety: - Individual verbal instruction and demonstration of equipment use and safety with use of the equipment. Flowsheet Row Pulmonary Rehab from 04/10/2021 in [facility] Cardiac and Pulmonary Rehab  Date 03/25/21  Educator AS  Instruction Review Code 1- Verbalizes Understanding      Education: Exercise Physiology & General Exercise Guidelines: - Group verbal and written instruction with models to review the exercise physiology of the cardiovascular system and associated critical values. Provides general exercise guidelines with specific guidelines to those with heart or lung disease.  Flowsheet Row Pulmonary Rehab from 04/10/2021 in [facility] Cardiac and Pulmonary Rehab  Date 04/10/21  Educator [facility]  Instruction Review Code 1- Verbalizes Understanding      Education: Flexibility, Balance, Mind/Body Relaxation: - Group verbal  and visual presentation with interactive activity on the components of exercise prescription. Introduces F.I.T.T principle from ACSM for exercise prescriptions. Reviews F.I.T.T. principles of flexibility and balance exercise training including progression. Also discusses the mind body connection.  Reviews various relaxation techniques to help reduce and manage stress (i.e. Deep breathing, progressive muscle relaxation, and visualization). Balance handout provided to take home. Written material given at graduation.   Activity Barriers & Risk Stratification:   6 Minute Walk:  6 Minute Walk    Row Name 03/25/21 1119         6 Minute Walk   Phase Initial     Distance 780 feet     Walk Time 6 minutes     # of Rest Breaks 0     MPH 1.48     METS 2.96     RPE 14     Perceived Dyspnea  3     VO2 Peak 10.36     Symptoms Yes (comment)     Comments SOB     Resting HR 94 bpm     Resting BP 118/80     Resting Oxygen Saturation  98 %     Exercise Oxygen Saturation  during 6 min walk 97 %     Max Ex. HR 113 bpm     Max Ex. BP 138/84     2 Minute Post BP 128/82           Interval HR   1 Minute  HR 108     2 Minute HR 109     3 Minute HR 111     4 Minute HR 109     5 Minute HR 109     6 Minute HR 113     2 Minute Post HR 102     Interval Heart Rate? Yes           Interval Oxygen   Interval Oxygen? Yes     Baseline Oxygen Saturation % 98 %     1 Minute Oxygen Saturation % 97 %     1 Minute Liters of Oxygen 4 L  pulsed     2 Minute Oxygen Saturation % 97 %     2 Minute Liters of Oxygen 4 L  pulsed     3 Minute Oxygen Saturation % 97 %     3 Minute Liters of Oxygen 4 L  pulsed     4 Minute Oxygen Saturation % 97 %     4 Minute Liters of Oxygen 4 L  pulsed     5 Minute Oxygen Saturation % 98 %     5 Minute Liters of Oxygen 4 L  pulsed     6 Minute Oxygen Saturation % 98 %     6 Minute Liters of Oxygen 4 L  pulsed     2 Minute Post Oxygen Saturation % 97 %     2 Minute Post  Liters of Oxygen 4 L           Oxygen Initial Assessment:  Oxygen Initial Assessment - 03/20/21 1556      Home Oxygen   Home Oxygen Device Home Concentrator;E-Tanks;Portable Concentrator    Sleep Oxygen Prescription CPAP;Continuous    Liters per minute 4    Home Exercise Oxygen Prescription Continuous    Liters per minute 4    Home Resting Oxygen Prescription Continuous    Liters per minute 2    Compliance with Home Oxygen Use No   Need a new sleep for CPAP.     Initial 6 min Walk   Oxygen Used Continuous    Liters per minute 4      Program Oxygen Prescription   Program Oxygen Prescription Continuous    Liters per minute 4      Intervention   Short Term Goals To learn and exhibit compliance with exercise, home and travel O2 prescription;To learn and understand importance of monitoring SPO2 with pulse oximeter and demonstrate accurate use of the pulse oximeter.;To learn and understand importance of maintaining oxygen saturations>88%;To learn and demonstrate proper pursed lip breathing techniques or other breathing techniques.;To learn and demonstrate proper use of respiratory medications    Long  Term Goals Exhibits compliance with exercise, home and travel O2 prescription;Verbalizes importance of monitoring SPO2 with pulse oximeter and return demonstration;Maintenance of O2 saturations>88%;Exhibits proper breathing techniques, such as pursed lip breathing or other method taught during program session;Demonstrates proper use of MDI's;Compliance with respiratory medication           Oxygen Re-Evaluation:  Oxygen Re-Evaluation    Row Name 03/27/21 0812             Program Oxygen Prescription   Program Oxygen Prescription Continuous       Liters per minute 4               Home Oxygen   Home Oxygen Device Home Concentrator;E-Tanks;Portable Concentrator       Sleep Oxygen Prescription CPAP;Continuous  Liters per minute 4       Home Exercise Oxygen Prescription  Continuous       Liters per minute 4       Home Resting Oxygen Prescription Continuous       Liters per minute 2       Compliance with Home Oxygen Use No               Goals/Expected Outcomes   Short Term Goals To learn and exhibit compliance with exercise, home and travel O2 prescription;To learn and understand importance of monitoring SPO2 with pulse oximeter and demonstrate accurate use of the pulse oximeter.;To learn and understand importance of maintaining oxygen saturations>88%;To learn and demonstrate proper pursed lip breathing techniques or other breathing techniques.       Long  Term Goals Exhibits compliance with exercise, home and travel O2 prescription;Verbalizes importance of monitoring SPO2 with pulse oximeter and return demonstration;Maintenance of O2 saturations>88%;Exhibits proper breathing techniques, such as pursed lip breathing or other method taught during program session       Comments Reviewed PLB technique with pt.  Talked about how it works and it's importance in maintaining their exercise saturations.       Goals/Expected Outcomes Short: Become more profiecient at using PLB.   Long: Become independent at using PLB.              Oxygen Discharge (Final Oxygen Re-Evaluation):  Oxygen Re-Evaluation - 03/27/21 0812      Program Oxygen Prescription   Program Oxygen Prescription Continuous    Liters per minute 4      Home Oxygen   Home Oxygen Device Home Concentrator;E-Tanks;Portable Concentrator    Sleep Oxygen Prescription CPAP;Continuous    Liters per minute 4    Home Exercise Oxygen Prescription Continuous    Liters per minute 4    Home Resting Oxygen Prescription Continuous    Liters per minute 2    Compliance with Home Oxygen Use No      Goals/Expected Outcomes   Short Term Goals To learn and exhibit compliance with exercise, home and travel O2 prescription;To learn and understand importance of monitoring SPO2 with pulse oximeter and demonstrate  accurate use of the pulse oximeter.;To learn and understand importance of maintaining oxygen saturations>88%;To learn and demonstrate proper pursed lip breathing techniques or other breathing techniques.    Long  Term Goals Exhibits compliance with exercise, home and travel O2 prescription;Verbalizes importance of monitoring SPO2 with pulse oximeter and return demonstration;Maintenance of O2 saturations>88%;Exhibits proper breathing techniques, such as pursed lip breathing or other method taught during program session    Comments Reviewed PLB technique with pt.  Talked about how it works and it's importance in maintaining their exercise saturations.    Goals/Expected Outcomes Short: Become more profiecient at using PLB.   Long: Become independent at using PLB.           Initial Exercise Prescription:  Initial Exercise Prescription - 03/25/21 1100      Date of Initial Exercise RX and Referring Provider   Date 03/25/21    Referring Provider [name]      Oxygen   Oxygen Continuous    Liters 4      Treadmill   MPH 1.5    Grade 0.5    Minutes 15    METs 2.25      Recumbant Bike   Level 1    RPM 60    Minutes 15    METs 2.25  NuStep   Level 2    SPM 80    Minutes 15    METs 2.25      Arm Ergometer   Level 1    RPM 25    Minutes 15    METs 2.25      Prescription Details   Frequency (times per week) 3      Intensity   THRR 40-80% of Max Heartrate 125-156    Ratings of Perceived Exertion 11-15    Perceived Dyspnea 0-4      Resistance Training   Training Prescription Yes    Weight 3 lb    Reps 10-15           Perform Capillary Blood Glucose checks as needed.  Exercise Prescription Changes:  Exercise Prescription Changes    Row Name 03/25/21 1100 04/08/21 0800 04/08/21 1100         Response to Exercise   Blood Pressure (Admit) 118/80 -- 140/84     Blood Pressure (Exercise) 138/84 -- 122/78     Blood Pressure (Exit) 128/82 -- 122/80     Heart Rate  (Admit) 94 bpm -- 108 bpm     Heart Rate (Exercise) 113 bpm -- 115 bpm     Heart Rate (Exit) 102 bpm -- 115 bpm     Oxygen Saturation (Admit) 98 % -- 95 %     Oxygen Saturation (Exercise) 97 % -- 98 %     Oxygen Saturation (Exit) 97 % -- 98 %     Rating of Perceived Exertion (Exercise) 14 -- 13     Perceived Dyspnea (Exercise) 3 -- 1     Symptoms SOB -- SOB     Duration -- -- Continue with 30 min of aerobic exercise without signs/symptoms of physical distress.     Intensity -- -- THRR unchanged           Progression   Progression -- -- Continue to progress workloads to maintain intensity without signs/symptoms of physical distress.     Average METs -- -- 2.5           Resistance Training   Training Prescription -- -- Yes     Weight -- -- 3 lb     Reps -- -- 10-15           Interval Training   Interval Training -- -- No           Oxygen   Oxygen -- -- Continuous     Liters -- -- 4           Recumbant Bike   Level -- -- 2.5     Minutes -- -- 15     METs -- -- 2.8           NuStep   Level -- -- 3     Minutes -- -- 15     METs -- -- 2.1           Arm Ergometer   Level -- -- 1     Minutes -- -- 15           Home Exercise Plan   Plans to continue exercise at -- Home (comment)  walking, staff videos Home (comment)  walking, staff videos     Frequency -- Add 1 additional day to program exercise sessions. Add 1 additional day to program exercise sessions.     Initial Home Exercises Provided -- 04/08/21 04/08/21  Exercise Comments:  Exercise Comments    Row Name 03/27/21 [phone]           Exercise Comments First full day of exercise!  Patient was oriented to gym and equipment including functions, settings, policies, and procedures.  Patient's individual exercise prescription and treatment plan were reviewed.  All starting workloads were established based on the results of the 6 minute walk test done at initial orientation visit.  The plan for exercise  progression was also introduced and progression will be customized based on patient's performance and goals.              Exercise Goals and Review:  Exercise Goals    Row Name 03/25/21 1140             Exercise Goals   Increase Physical Activity Yes       Intervention Provide advice, education, support and counseling about physical activity/exercise needs.;Develop an individualized exercise prescription for aerobic and resistive training based on initial evaluation findings, risk stratification, comorbidities and participant's personal goals.       Expected Outcomes Short Term: Attend rehab on a regular basis to increase amount of physical activity.;Long Term: Add in home exercise to make exercise part of routine and to increase amount of physical activity.;Long Term: Exercising regularly at least 3-5 days a week.       Increase Strength and Stamina Yes       Intervention Provide advice, education, support and counseling about physical activity/exercise needs.;Develop an individualized exercise prescription for aerobic and resistive training based on initial evaluation findings, risk stratification, comorbidities and participant's personal goals.       Expected Outcomes Short Term: Increase workloads from initial exercise prescription for resistance, speed, and METs.;Short Term: Perform resistance training exercises routinely during rehab and add in resistance training at home;Long Term: Improve cardiorespiratory fitness, muscular endurance and strength as measured by increased METs and functional capacity (6MWT)       Able to understand and use rate of perceived exertion (RPE) scale Yes       Intervention Provide education and explanation on how to use RPE scale       Expected Outcomes Short Term: Able to use RPE daily in rehab to express subjective intensity level;Long Term:  Able to use RPE to guide intensity level when exercising independently       Able to understand and use Dyspnea  scale Yes       Intervention Provide education and explanation on how to use Dyspnea scale       Expected Outcomes Short Term: Able to use Dyspnea scale daily in rehab to express subjective sense of shortness of breath during exertion;Long Term: Able to use Dyspnea scale to guide intensity level when exercising independently       Knowledge and understanding of Target Heart Rate Range (THRR) Yes       Intervention Provide education and explanation of THRR including how the numbers were predicted and where they are located for reference       Expected Outcomes Short Term: Able to state/look up THRR;Short Term: Able to use daily as guideline for intensity in rehab;Long Term: Able to use THRR to govern intensity when exercising independently       Able to check pulse independently Yes       Intervention Provide education and demonstration on how to check pulse in carotid and radial arteries.;Review the importance of being able to check your own pulse for safety during independent  exercise       Expected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

## 2021-04-18 ENCOUNTER — Other Ambulatory Visit: Payer: Self-pay

## 2021-04-18 DIAGNOSIS — J1282 Pneumonia due to coronavirus disease 2019: Secondary | ICD-10-CM

## 2021-04-18 DIAGNOSIS — U071 COVID-19: Secondary | ICD-10-CM | POA: Diagnosis not present

## 2021-04-18 DIAGNOSIS — R0600 Dyspnea, unspecified: Secondary | ICD-10-CM

## 2021-04-18 NOTE — Progress Notes (Signed)
Daily Session Note  Patient Details  Name: Crystal Gibson MRN: [id number] Date of Birth: 10-06-1971 Referring Provider:   Flowsheet Row Pulmonary Rehab from 03/25/2021 in [facility] Cardiac and Pulmonary Rehab  Referring Provider [name]      Encounter Date: 04/18/2021  Check In:  Session Check In - 04/18/21 0800      Check-In   Supervising physician immediately available to respond to emergencies See telemetry face sheet for immediately available ER MD    Location [facility]-Cardiac & Pulmonary Rehab    Staff Present Birdie Sons, MPA, Mauricia Area, BS, ACSM CEP, Exercise Physiologist;Amanda Oletta Darter, BA, ACSM CEP, Exercise Physiologist    Virtual Visit No    Medication changes reported     No    Fall or balance concerns reported    No    Warm-up and Cool-down Performed on first and last piece of equipment    Resistance Training Performed Yes    VAD Patient? No    PAD/SET Patient? No      Pain Assessment   Currently in Pain? No/denies              Social History   Tobacco Use  Smoking Status Never Smoker  Smokeless Tobacco Never Used    Goals Met:  Independence with exercise equipment Exercise tolerated well No report of cardiac concerns or symptoms Strength training completed today  Goals Unmet:  Not Applicable  Comments: Pt able to follow exercise prescription today without complaint.  Will continue to monitor for progression.    Dr. Emily Filbert is Medical Director for [place] and [facility].

## 2021-04-19 ENCOUNTER — Other Ambulatory Visit: Payer: Self-pay

## 2021-04-19 ENCOUNTER — Encounter: Payer: No Typology Code available for payment source | Admitting: *Deleted

## 2021-04-19 DIAGNOSIS — R0600 Dyspnea, unspecified: Secondary | ICD-10-CM

## 2021-04-19 DIAGNOSIS — U071 COVID-19: Secondary | ICD-10-CM

## 2021-04-19 NOTE — Progress Notes (Signed)
Daily Session Note  Patient Details  Name: Crystal Gibson MRN: [id number] Date of Birth: 1971-06-21 Referring Provider:   Flowsheet Row Pulmonary Rehab from 03/25/2021 in [facility] Cardiac and Pulmonary Rehab  Referring Provider [name]      Encounter Date: 04/19/2021  Check In:  Session Check In - 04/19/21 0844      Check-In   Supervising physician immediately available to respond to emergencies See telemetry face sheet for immediately available ER MD    Location [facility]-Cardiac & Pulmonary Rehab    Staff Present Heath Lark, RN, BSN, CCRP;Joseph Lou Miner, [place] Exercise Physiologist    Virtual Visit No    Medication changes reported     No    Fall or balance concerns reported    No    Warm-up and Cool-down Performed on first and last piece of equipment    Resistance Training Performed Yes    VAD Patient? No    PAD/SET Patient? No      Pain Assessment   Currently in Pain? No/denies              Social History   Tobacco Use  Smoking Status Never Smoker  Smokeless Tobacco Never Used    Goals Met:  Proper associated with RPD/PD & O2 Sat Independence with exercise equipment Exercise tolerated well No report of cardiac concerns or symptoms  Goals Unmet:  Not Applicable  Comments: Pt able to follow exercise prescription today without complaint.  Will continue to monitor for progression.    Dr. Emily Filbert is Medical Director for [place] and [facility].

## 2021-04-26 ENCOUNTER — Other Ambulatory Visit: Payer: Self-pay

## 2021-04-26 DIAGNOSIS — U071 COVID-19: Secondary | ICD-10-CM

## 2021-04-26 DIAGNOSIS — R0600 Dyspnea, unspecified: Secondary | ICD-10-CM

## 2021-04-26 DIAGNOSIS — J1282 Pneumonia due to coronavirus disease 2019: Secondary | ICD-10-CM

## 2021-04-26 NOTE — Progress Notes (Signed)
Daily Session Note  Patient Details  Name: Crystal Gibson MRN: [id number] Date of Birth: February 17, 1971 Referring Provider:   Flowsheet Row Pulmonary Rehab from 03/25/2021 in [facility] Cardiac and Pulmonary Rehab  Referring Provider [name]      Encounter Date: 04/26/2021  Check In:  Session Check In - 04/26/21 0828      Check-In   Supervising physician immediately available to respond to emergencies See telemetry face sheet for immediately available ER MD    Location [facility]-Cardiac & Pulmonary Rehab    Staff Present Basilia Jumbo, RN, BSN;Jessica Medford, MA, RCEP, CCRP, CCET;Joseph Woodford RCP,RRT,BSRT    Virtual Visit No    Medication changes reported     No    Fall or balance concerns reported    No    Tobacco Cessation No Change    Warm-up and Cool-down Performed on first and last piece of equipment    Resistance Training Performed Yes    VAD Patient? No    PAD/SET Patient? No      Pain Assessment   Currently in Pain? No/denies              Social History   Tobacco Use  Smoking Status Never Smoker  Smokeless Tobacco Never Used    Goals Met:  Independence with exercise equipment Exercise tolerated well No report of cardiac concerns or symptoms  Goals Unmet:  Not Applicable  Comments: Pt able to follow exercise prescription today without complaint.  Will continue to monitor for progression.    Dr. Emily Filbert is Medical Director for [place] and [facility].

## 2021-04-29 ENCOUNTER — Encounter: Payer: Medicaid Other | Attending: Pulmonary Disease

## 2021-04-29 ENCOUNTER — Other Ambulatory Visit: Payer: Self-pay

## 2021-04-29 ENCOUNTER — Emergency Department: Payer: Medicaid Other

## 2021-04-29 ENCOUNTER — Emergency Department
Admission: EM | Admit: 2021-04-29 | Discharge: 2021-04-29 | Disposition: A | Discharge status: Home / Self Care | Payer: Medicaid Other | Attending: Emergency Medicine | Admitting: Emergency Medicine

## 2021-04-29 DIAGNOSIS — S300XXA Contusion of lower back and pelvis, initial encounter: Secondary | ICD-10-CM | POA: Insufficient documentation

## 2021-04-29 DIAGNOSIS — Y92410 Unspecified street and highway as the place of occurrence of the external cause: Secondary | ICD-10-CM | POA: Diagnosis not present

## 2021-04-29 DIAGNOSIS — R0789 Other chest pain: Secondary | ICD-10-CM | POA: Diagnosis not present

## 2021-04-29 DIAGNOSIS — Z7952 Long term (current) use of systemic steroids: Secondary | ICD-10-CM | POA: Insufficient documentation

## 2021-04-29 DIAGNOSIS — R079 Chest pain, unspecified: Secondary | ICD-10-CM | POA: Diagnosis not present

## 2021-04-29 DIAGNOSIS — S3992XA Unspecified injury of lower back, initial encounter: Secondary | ICD-10-CM | POA: Diagnosis present

## 2021-04-29 DIAGNOSIS — S5012XA Contusion of left forearm, initial encounter: Secondary | ICD-10-CM | POA: Insufficient documentation

## 2021-04-29 DIAGNOSIS — R1032 Left lower quadrant pain: Secondary | ICD-10-CM | POA: Insufficient documentation

## 2021-04-29 DIAGNOSIS — R0600 Dyspnea, unspecified: Secondary | ICD-10-CM | POA: Diagnosis not present

## 2021-04-29 DIAGNOSIS — J1282 Pneumonia due to coronavirus disease 2019: Secondary | ICD-10-CM

## 2021-04-29 DIAGNOSIS — J45909 Unspecified asthma, uncomplicated: Secondary | ICD-10-CM | POA: Insufficient documentation

## 2021-04-29 DIAGNOSIS — T1490XA Injury, unspecified, initial encounter: Secondary | ICD-10-CM

## 2021-04-29 DIAGNOSIS — U071 COVID-19: Secondary | ICD-10-CM | POA: Diagnosis not present

## 2021-04-29 LAB — CBC WITH DIFFERENTIAL/PLATELET
Abs Immature Granulocytes: 0.01 10*3/uL (ref 0.00–0.07)
Basophils Absolute: 0 10*3/uL (ref 0.0–0.1)
Basophils Relative: 0 %
Eosinophils Absolute: 0 10*3/uL (ref 0.0–0.5)
Eosinophils Relative: 1 %
HCT: 39.8 % (ref 36.0–46.0)
Hemoglobin: 14 g/dL (ref 12.0–15.0)
Immature Granulocytes: 0 %
Lymphocytes Relative: 58 %
Lymphs Abs: 2.3 10*3/uL (ref 0.7–4.0)
MCH: 30.4 pg (ref 26.0–34.0)
MCHC: 35.2 g/dL (ref 30.0–36.0)
MCV: 86.5 fL (ref 80.0–100.0)
Monocytes Absolute: 0.3 10*3/uL (ref 0.1–1.0)
Monocytes Relative: 6 %
Neutro Abs: 1.4 10*3/uL — ABNORMAL LOW (ref 1.7–7.7)
Neutrophils Relative %: 35 %
Platelets: 303 10*3/uL (ref 150–400)
RBC: 4.6 MIL/uL (ref 3.87–5.11)
RDW: 12.6 % (ref 11.5–15.5)
WBC: 3.9 10*3/uL — ABNORMAL LOW (ref 4.0–10.5)
nRBC: 0 % (ref 0.0–0.2)

## 2021-04-29 LAB — BASIC METABOLIC PANEL
Anion gap: 10 (ref 5–15)
BUN: 13 mg/dL (ref 6–20)
CO2: 26 mmol/L (ref 22–32)
Calcium: 9.4 mg/dL (ref 8.9–10.3)
Chloride: 101 mmol/L (ref 98–111)
Creatinine, Ser: 0.69 mg/dL (ref 0.44–1.00)
GFR, Estimated: >60 mL/min (ref >60)
Glucose, Bld: 192 mg/dL — ABNORMAL HIGH (ref 70–99)
Potassium: 3.5 mmol/L (ref 3.5–5.1)
Sodium: 137 mmol/L (ref 135–145)

## 2021-04-29 MED ORDER — IOHEXOL 300 MG/ML  SOLN
100.0000 mL | Freq: Once | INTRAMUSCULAR | Status: AC | PRN
  Administered 2021-04-29: 100 mL via INTRAVENOUS
  Filled 2021-04-29: qty 100

## 2021-04-29 MED ORDER — HYDROCODONE-ACETAMINOPHEN 5-325 MG PO TABS: 1.0000 | ORAL_TABLET | ORAL | 0 refills | Status: AC | PRN

## 2021-04-29 MED ORDER — OXYCODONE-ACETAMINOPHEN 5-325 MG PO TABS
1.0000 | ORAL_TABLET | Freq: Once | ORAL | Status: AC
  Administered 2021-04-29: 1 via ORAL
  Filled 2021-04-29: qty 1
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
## 2021-04-29 NOTE — Discharge Instructions (Signed)
Return to the ER for new, worsening, or persistent severe pain, weakness or numbness, severe headache, or any other new or worsening symptoms that concern you.

## 2021-04-29 NOTE — ED Notes (Signed)
Urine pregnancy test cancelled, pt had total hysterectomy several years ago. Dr Marisa Severin aware.
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
## 2021-04-29 NOTE — Progress Notes (Signed)
Daily Session Note  Patient Details  Name: Crystal Gibson MRN: [id number] Date of Birth: 1971/10/11 Referring Provider:   Flowsheet Row Pulmonary Rehab from 03/25/2021 in [facility] Cardiac and Pulmonary Rehab  Referring Provider [name]      Encounter Date: 04/29/2021  Check In:  Session Check In - 04/29/21 0759      Check-In   Supervising physician immediately available to respond to emergencies See telemetry face sheet for immediately available ER MD    Location [facility]-Cardiac & Pulmonary Rehab    Staff Present Alberteen Sam, MA, RCEP, CCRP, Rosalio Macadamia, BS, ACSM CEP, Exercise Physiologist;Aradia Estey Psychologist, prison and probation services, BSN;Joseph Hood RCP,RRT,BSRT    Virtual Visit No    Medication changes reported     No    Fall or balance concerns reported    No    Tobacco Cessation No Change    Warm-up and Cool-down Performed on first and last piece of equipment    Resistance Training Performed Yes    VAD Patient? No    PAD/SET Patient? No      Pain Assessment   Currently in Pain? No/denies              Social History   Tobacco Use  Smoking Status Never Smoker  Smokeless Tobacco Never Used    Goals Met:  Proper associated with RPD/PD & O2 Sat Independence with exercise equipment Using PLB without cueing & demonstrates good technique Exercise tolerated well No report of cardiac concerns or symptoms Strength training completed today  Goals Unmet:  Not Applicable  Comments: Pt able to follow exercise prescription today without complaint.  Will continue to monitor for progression.   Dr. Emily Filbert is Medical Director for [place] and [facility].

## 2021-05-01 ENCOUNTER — Other Ambulatory Visit: Payer: Self-pay

## 2021-05-01 DIAGNOSIS — R0600 Dyspnea, unspecified: Secondary | ICD-10-CM | POA: Diagnosis not present

## 2021-05-01 DIAGNOSIS — U071 COVID-19: Secondary | ICD-10-CM | POA: Diagnosis not present

## 2021-05-01 DIAGNOSIS — J1282 Pneumonia due to coronavirus disease 2019: Secondary | ICD-10-CM | POA: Diagnosis not present

## 2021-05-01 NOTE — Progress Notes (Signed)
Daily Session Note  Patient Details  Name: UNDRA TREMBATH MRN: [id number] Date of Birth: 1971-06-28 Referring Provider:   Flowsheet Row Pulmonary Rehab from 03/25/2021 in [facility] Cardiac and Pulmonary Rehab  Referring Provider [name]      Encounter Date: 05/01/2021  Check In:  Session Check In - 05/01/21 0754      Check-In   Supervising physician immediately available to respond to emergencies See telemetry face sheet for immediately available ER MD    Location [facility]-Cardiac & Pulmonary Rehab    Staff Present Birdie Sons, MPA, RN;Joseph Darrin Nipper, [place], RCEP, CCRP, CCET    Virtual Visit No    Medication changes reported     No    Fall or balance concerns reported    No    Tobacco Cessation No Change    Warm-up and Cool-down Performed on first and last piece of equipment    Resistance Training Performed Yes    VAD Patient? No    PAD/SET Patient? No      Pain Assessment   Currently in Pain? No/denies              Social History   Tobacco Use  Smoking Status Never Smoker  Smokeless Tobacco Never Used    Goals Met:  Independence with exercise equipment Exercise tolerated well No report of cardiac concerns or symptoms Strength training completed today  Goals Unmet:  Not Applicable  Comments: Pt able to follow exercise prescription today without complaint.  Will continue to monitor for progression.    Dr. Emily Filbert is Medical Director for [place] and [facility].

## 2021-05-03 ENCOUNTER — Other Ambulatory Visit: Payer: Self-pay

## 2021-05-03 ENCOUNTER — Encounter: Payer: Medicaid Other | Admitting: *Deleted

## 2021-05-03 DIAGNOSIS — J1282 Pneumonia due to coronavirus disease 2019: Secondary | ICD-10-CM

## 2021-05-03 DIAGNOSIS — R0600 Dyspnea, unspecified: Secondary | ICD-10-CM | POA: Diagnosis not present

## 2021-05-03 DIAGNOSIS — U071 COVID-19: Secondary | ICD-10-CM | POA: Diagnosis not present

## 2021-05-03 NOTE — Progress Notes (Signed)
Daily Session Note  Patient Details  Name: Crystal Gibson MRN: [id number] Date of Birth: [birth year] Referring Provider:   Flowsheet Row Pulmonary Rehab from 03/25/2021 in [facility] Cardiac and Pulmonary Rehab  Referring Provider [name]      Encounter Date: 05/03/2021  Check In:  Session Check In - 05/03/21 0838      Check-In   Supervising physician immediately available to respond to emergencies See telemetry face sheet for immediately available ER MD    Location [facility]-Cardiac & Pulmonary Rehab    Staff Present Heath Lark, RN, BSN, CCRP;Joseph Hood RCP,RRT,BSRT;Jessica [place], [place], [place], [place], CCET    Virtual Visit No    Medication changes reported     No    Fall or balance concerns reported    No    Warm-up and Cool-down Performed on first and last piece of equipment    Resistance Training Performed Yes    VAD Patient? No    PAD/SET Patient? No      Pain Assessment   Currently in Pain? No/denies              Social History   Tobacco Use  Smoking Status Never Smoker  Smokeless Tobacco Never Used    Goals Met:  Proper associated with RPD/PD & O2 Sat Independence with exercise equipment Exercise tolerated well No report of cardiac concerns or symptoms  Goals Unmet:  Not Applicable  Comments: Pt able to follow exercise prescription today without complaint.  Will continue to monitor for progression.    Dr. Emily Filbert is Medical Director for [place] and [facility].

## 2021-05-06 ENCOUNTER — Other Ambulatory Visit: Payer: Self-pay

## 2021-05-06 ENCOUNTER — Encounter: Payer: Medicaid Other | Admitting: *Deleted

## 2021-05-06 DIAGNOSIS — R0600 Dyspnea, unspecified: Secondary | ICD-10-CM | POA: Diagnosis not present

## 2021-05-06 DIAGNOSIS — U071 COVID-19: Secondary | ICD-10-CM | POA: Diagnosis not present

## 2021-05-06 DIAGNOSIS — J1282 Pneumonia due to coronavirus disease 2019: Secondary | ICD-10-CM | POA: Diagnosis not present

## 2021-05-06 NOTE — Progress Notes (Signed)
Daily Session Note  Patient Details  Name: Crystal Gibson MRN: [id number] Date of Birth: 1971/04/29 Referring Provider:   Flowsheet Row Pulmonary Rehab from 03/25/2021 in [facility] Cardiac and Pulmonary Rehab  Referring Provider [name]      Encounter Date: 05/06/2021  Check In:  Session Check In - 05/06/21 0919      Check-In   Supervising physician immediately available to respond to emergencies See telemetry face sheet for immediately available ER MD    Location [facility]-Cardiac & Pulmonary Rehab    Staff Present Earlean Shawl, BS, ACSM CEP, Exercise Physiologist;Joseph Flavia Shipper;Heath Lark, RN, BSN, CCRP    Virtual Visit No    Medication changes reported     No    Fall or balance concerns reported    No    Warm-up and Cool-down Performed on first and last piece of equipment    VAD Patient? No    PAD/SET Patient? No      Pain Assessment   Currently in Pain? No/denies              Social History   Tobacco Use  Smoking Status Never Smoker  Smokeless Tobacco Never Used    Goals Met:  Proper associated with RPD/PD & O2 Sat Independence with exercise equipment Exercise tolerated well No report of cardiac concerns or symptoms  Goals Unmet:  Not Applicable  Comments: Pt able to follow exercise prescription today without complaint.  Will continue to monitor for progression.    Dr. Emily Filbert is Medical Director for [place] and [facility].

## 2021-05-08 ENCOUNTER — Other Ambulatory Visit: Payer: Self-pay

## 2021-05-08 DIAGNOSIS — J1282 Pneumonia due to coronavirus disease 2019: Secondary | ICD-10-CM

## 2021-05-08 DIAGNOSIS — U071 COVID-19: Secondary | ICD-10-CM | POA: Diagnosis not present

## 2021-05-08 DIAGNOSIS — R0600 Dyspnea, unspecified: Secondary | ICD-10-CM | POA: Diagnosis not present

## 2021-05-08 NOTE — Progress Notes (Signed)
Daily Session Note  Patient Details  Name: Crystal Gibson MRN: [id number] Date of Birth: 06/16/1971 Referring Provider:   Flowsheet Row Pulmonary Rehab from 03/25/2021 in [facility] Cardiac and Pulmonary Rehab  Referring Provider [name]      Encounter Date: 05/08/2021  Check In:  Session Check In - 05/08/21 0815      Check-In   Supervising physician immediately available to respond to emergencies See telemetry face sheet for immediately available ER MD    Location [facility]-Cardiac & Pulmonary Rehab    Staff Present Birdie Sons, MPA, Elveria Rising, BA, ACSM CEP, Exercise Physiologist;Joseph Tessie Fass RCP,RRT,BSRT    Virtual Visit No    Medication changes reported     Yes    Comments increased metoprolol to 128m and added baclofen 24m   Fall or balance concerns reported    No    Tobacco Cessation No Change    Warm-up and Cool-down Performed on first and last piece of equipment    Resistance Training Performed Yes    VAD Patient? No    PAD/SET Patient? No      Pain Assessment   Currently in Pain? No/denies              Social History   Tobacco Use  Smoking Status Never Smoker  Smokeless Tobacco Never Used    Goals Met:  Independence with exercise equipment Exercise tolerated well No report of cardiac concerns or symptoms Strength training completed today  Goals Unmet:  Not Applicable  Comments: Pt able to follow exercise prescription today without complaint.  Will continue to monitor for progression.    Dr. MaEmily Filberts Medical Director for [place].

## 2021-05-10 ENCOUNTER — Other Ambulatory Visit: Payer: Self-pay

## 2021-05-10 ENCOUNTER — Encounter: Payer: Medicaid Other | Admitting: *Deleted

## 2021-05-10 DIAGNOSIS — U071 COVID-19: Secondary | ICD-10-CM

## 2021-05-10 DIAGNOSIS — J1282 Pneumonia due to coronavirus disease 2019: Secondary | ICD-10-CM | POA: Diagnosis not present

## 2021-05-10 DIAGNOSIS — R0600 Dyspnea, unspecified: Secondary | ICD-10-CM | POA: Diagnosis not present

## 2021-05-10 NOTE — Progress Notes (Signed)
Daily Session Note  Patient Details  Name: Crystal Gibson MRN: [id number] Date of Birth: 11-Mar-1971 Referring Provider:   Flowsheet Row Pulmonary Rehab from 03/25/2021 in [facility] Cardiac and Pulmonary Rehab  Referring Provider [name]      Encounter Date: 05/10/2021  Check In:  Session Check In - 05/10/21 0837      Check-In   Supervising physician immediately available to respond to emergencies See telemetry face sheet for immediately available ER MD    Location [facility]-Cardiac & Pulmonary Rehab    Staff Present Hope Budds RDN, LDN;Jessica Luan Pulling, MA, RCEP, CCRP, CCET;Whitaker Holderman, RN, BSN, CCRP    Virtual Visit No    Medication changes reported     No    Fall or balance concerns reported    No    Warm-up and Cool-down Performed on first and last piece of equipment    Resistance Training Performed Yes    VAD Patient? No    PAD/SET Patient? No      Pain Assessment   Currently in Pain? No/denies              Social History   Tobacco Use  Smoking Status Never Smoker  Smokeless Tobacco Never Used    Goals Met:  Proper associated with RPD/PD & O2 Sat Independence with exercise equipment Exercise tolerated well No report of cardiac concerns or symptoms  Goals Unmet:  Not Applicable  Comments: Pt able to follow exercise prescription today without complaint.  Will continue to monitor for progression.    Dr. Emily Filbert is Medical Director for [place] and [facility].

## 2021-05-13 ENCOUNTER — Other Ambulatory Visit: Payer: Self-pay

## 2021-05-13 ENCOUNTER — Encounter: Payer: Medicaid Other | Admitting: *Deleted

## 2021-05-13 DIAGNOSIS — U071 COVID-19: Secondary | ICD-10-CM

## 2021-05-13 DIAGNOSIS — J1282 Pneumonia due to coronavirus disease 2019: Secondary | ICD-10-CM | POA: Diagnosis not present

## 2021-05-13 DIAGNOSIS — R0600 Dyspnea, unspecified: Secondary | ICD-10-CM | POA: Diagnosis not present

## 2021-05-13 NOTE — Progress Notes (Signed)
Daily Session Note  Patient Details  Name: Crystal Gibson MRN: [id number] Date of Birth: 1971/11/04 Referring Provider:   Flowsheet Row Pulmonary Rehab from 03/25/2021 in [facility] Cardiac and Pulmonary Rehab  Referring Provider [name]      Encounter Date: 05/13/2021  Check In:  Session Check In - 05/13/21 0841      Check-In   Supervising physician immediately available to respond to emergencies See telemetry face sheet for immediately available ER MD    Location [facility]-Cardiac & Pulmonary Rehab    Staff Present Heath Lark, RN, BSN, CCRP;Joseph Hood RCP,RRT,BSRT;Kelly [place], [place], ACSM CEP, Exercise Physiologist    Virtual Visit No    Medication changes reported     No    Fall or balance concerns reported    No    Warm-up and Cool-down Performed on first and last piece of equipment    Resistance Training Performed Yes    VAD Patient? No    PAD/SET Patient? No      Pain Assessment   Currently in Pain? No/denies              Social History   Tobacco Use  Smoking Status Never Smoker  Smokeless Tobacco Never Used    Goals Met:  Proper associated with RPD/PD & O2 Sat Independence with exercise equipment Exercise tolerated well No report of cardiac concerns or symptoms  Goals Unmet:  Not Applicable  Comments: Pt able to follow exercise prescription today without complaint.  Will continue to monitor for progression.    Dr. Emily Filbert is Medical Director for [place] and [facility].

## 2021-05-15 ENCOUNTER — Other Ambulatory Visit: Payer: Self-pay

## 2021-05-15 ENCOUNTER — Encounter: Payer: Self-pay | Admitting: *Deleted

## 2021-05-15 DIAGNOSIS — J1282 Pneumonia due to coronavirus disease 2019: Secondary | ICD-10-CM

## 2021-05-15 DIAGNOSIS — R0600 Dyspnea, unspecified: Secondary | ICD-10-CM | POA: Diagnosis not present

## 2021-05-15 DIAGNOSIS — U071 COVID-19: Secondary | ICD-10-CM | POA: Diagnosis not present

## 2021-05-15 NOTE — Progress Notes (Signed)
Daily Session Note  Patient Details  Name: Crystal Gibson MRN: [id number] Date of Birth: 13-Sep-1971 Referring Provider:   Flowsheet Row Pulmonary Rehab from 03/25/2021 in [facility] Cardiac and Pulmonary Rehab  Referring Provider [name]      Encounter Date: 05/15/2021  Check In:  Session Check In - 05/15/21 0749      Check-In   Supervising physician immediately available to respond to emergencies See telemetry face sheet for immediately available ER MD    Location [facility]-Cardiac & Pulmonary Rehab    Staff Present Birdie Sons, MPA, Elveria Rising, BA, ACSM CEP, Exercise Physiologist;Joseph Tessie Fass RCP,RRT,BSRT    Virtual Visit No    Medication changes reported     No    Fall or balance concerns reported    No    Tobacco Cessation No Change    Warm-up and Cool-down Performed on first and last piece of equipment    Resistance Training Performed Yes    VAD Patient? No    PAD/SET Patient? No      Pain Assessment   Currently in Pain? No/denies              Social History   Tobacco Use  Smoking Status Never Smoker  Smokeless Tobacco Never Used    Goals Met:  Independence with exercise equipment Exercise tolerated well No report of cardiac concerns or symptoms Strength training completed today  Goals Unmet:  Not Applicable  Comments: Pt able to follow exercise prescription today without complaint.  Will continue to monitor for progression.    Dr. Emily Filbert is Medical Director for [place] and [facility].

## 2021-05-15 NOTE — Progress Notes (Signed)
Pulmonary Individual Treatment Plan  Patient Details  Name: Crystal Gibson MRN: [id number] Date of Birth: Jul 26, 1971 Referring Provider:   Flowsheet Row Pulmonary Rehab from 03/25/2021 in [facility] Cardiac and Pulmonary Rehab  Referring Provider [name]      Initial Encounter Date:  Flowsheet Row Pulmonary Rehab from 03/25/2021 in [facility] Cardiac and Pulmonary Rehab  Date 03/25/21      Visit Diagnosis: Pneumonia due to COVID-19 virus  Patient's Home Medications on Admission:  Current Outpatient Medications:  .  ACCU-CHEK FASTCLIX LANCETS MISC, , Disp: , Rfl: 2 .  acetaminophen (TYLENOL) 500 MG tablet, Take by mouth., Disp: , Rfl:  .  albuterol (PROVENTIL HFA;VENTOLIN HFA) 108 (90 BASE) MCG/ACT inhaler, Inhale 2 puffs into the lungs every 4 (four) hours as needed for wheezing or shortness of breath., Disp: 1 Inhaler, Rfl: 0 .  apixaban (ELIQUIS) 5 MG TABS tablet, Take by mouth., Disp: , Rfl:  .  atorvastatin (LIPITOR) 20 MG tablet, , Disp: , Rfl: 1 .  BD PEN NEEDLE NANO 2ND GEN 32G X 4 MM MISC, USE THREE TIMES DAILY BEFORE MEALS, Disp: , Rfl:  .  Blood Glucose Monitoring Suppl (ACCU-CHEK GUIDE) w/Device KIT, USE DEVICE TO CHECK SUGARS DAILY, Disp: , Rfl:  .  butalbital-acetaminophen-caffeine (FIORICET) 50-325-40 MG tablet, Take by mouth., Disp: , Rfl:  .  butalbital-acetaminophen-caffeine (FIORICET) 50-325-40 MG tablet, Take 1 tablet by mouth every 4 (four) hours as needed. (Patient not taking: Reported on 03/20/2021), Disp: , Rfl:  .  cetirizine (ZYRTEC) 10 MG tablet, Take 10 mg by mouth daily. (Patient not taking: Reported on 03/20/2021), Disp: , Rfl:  .  Cetirizine HCl (ZYRTEC ALLERGY) 10 MG CAPS, Take by mouth., Disp: , Rfl:  .  chlorthalidone (HYGROTON) 25 MG tablet, Take 25 mg by mouth daily., Disp: , Rfl:  .  cyclobenzaprine (FLEXERIL) 5 MG tablet, Take 1-2 tablets 3 times daily as needed (Patient not taking: Reported on 03/20/2021), Disp: 20 tablet, Rfl: 0 .  DULoxetine (CYMBALTA) 60 MG  capsule, TK ONE C PO QD, Disp: , Rfl: 0 .  etodolac (LODINE) 400 MG tablet, etodolac 400 mg tablet  TK 1 T PO BID (Patient not taking: Reported on 03/20/2021), Disp: , Rfl:  .  famciclovir (FAMVIR) 500 MG tablet, famciclovir 500 mg tablet  TK 1 T PO BID (Patient not taking: Reported on 03/20/2021), Disp: , Rfl:  .  famotidine (PEPCID) 20 MG tablet, Take 20 mg by mouth daily., Disp: , Rfl:  .  FARXIGA 10 MG TABS tablet, Take 10 mg by mouth daily. (Patient not taking: No sig reported), Disp: , Rfl:  .  fluconazole (DIFLUCAN) 150 MG tablet, fluconazole 150 mg tablet, Disp: , Rfl:  .  Fluocinolone Acetonide Body 0.01 % OIL, Derma-Smoothe/FS Scalp 0.01% External Oil QTY: 1 mL Days: 30 Refills: 5  Written: 01/16/21 Patient Instructions: Apply thin film to affected areas on scalp every 8 hours, Disp: , Rfl:  .  Fluocinolone Acetonide Scalp 0.01 % OIL, SMARTSIG:Sparingly Topical Every 8 Hours, Disp: , Rfl:  .  fluticasone (FLONASE) 50 MCG/ACT nasal spray, Place 1 spray into both nostrils 2 (two) times daily., Disp: 16 g, Rfl: 0 .  fluticasone (FLOVENT HFA) 110 MCG/ACT inhaler, Inhale into the lungs., Disp: , Rfl:  .  fluticasone-salmeterol (ADVAIR HFA) 115-21 MCG/ACT inhaler, Inhale 2 puffs into the lungs 2 (two) times daily., Disp: 1 each, Rfl: 2 .  glipiZIDE (GLUCOTROL XL) 5 MG 24 hr tablet, , Disp: , Rfl: 1 .  glucose blood (ACCU-CHEK GUIDE) test strip, Accu-Chek Guide In Vitro Strip QTY: 200 strip Days: 90 Refills: 1  Written: 04/27/20 Patient Instructions: TEST FASTING BLOOD SUGAR EVERY MORNING FASTING AND EVERY EVENING E11.65, Disp: , Rfl:  .  glucose blood ([employer] BLOOD GLUCOSE TEST) test strip, Use to check blood sugar as directed with insulin 3 times a day & for symptoms of high or low blood sugar., Disp: , Rfl:  .  glucose blood test strip, Accu-Chek Active In Vitro Strip QTY: 300 strip Days: 90 Refills: 3  Written: 04/06/19 Patient Instructions: Use with glucometer to check blood sugars daily in AM  fasting and prn for low/high blood sugars - E11.9, Disp: , Rfl:  .  glucose blood test strip, OneTouch Verio test strips, Disp: , Rfl:  .  HUMALOG KWIKPEN 100 UNIT/ML KwikPen, 5 Units 3 (three) times daily., Disp: , Rfl:  .  ibuprofen (ADVIL,MOTRIN) 600 MG tablet, Take 1 tablet (600 mg total) by mouth every 6 (six) hours as needed., Disp: 30 tablet, Rfl: 0 .  icosapent Ethyl (VASCEPA) 1 g capsule, Vascepa 1 GM Oral Capsule QTY: 120 capsule Days: 30 Refills: 0  Written: 11/09/20 Patient Instructions: Take 2 capsules by mouth daily in AM and 2 capsules in evening, need labs and appt, Disp: , Rfl:  .  insulin lispro (HUMALOG) 100 UNIT/ML KwikPen, Inject into the skin., Disp: , Rfl:  .  Insulin NPH, Human,, Isophane, (HUMULIN N KWIKPEN) 100 UNIT/ML Kiwkpen, Inject 20 Units into the skin in the morning and at bedtime., Disp: , Rfl:  .  Insulin Pen Needle (GLOBAL EASY GLIDE PEN NEEDLES) 32G X 4 MM MISC, Use for injections three (3) times a day before meals., Disp: , Rfl:  .  ISOtretinoin (ACCUTANE) 30 MG capsule, , Disp: , Rfl:  .  ketoconazole (NIZORAL) 2 % shampoo, ketoconazole 2 % shampoo  APPLY TO SCALP ONCE WEEKLY AND LET SIT SEVERAL MINUTES BEFORE RINSING, Disp: , Rfl:  .  Lancets Misc. (ACCU-CHEK FASTCLIX LANCET) KIT, Accu-Chek Fastclix Lancet Drum  USE TO CHECK FASTING IN AM AND AS NEEDED FOR HIGH OR LOW, Disp: , Rfl:  .  levofloxacin (LEVAQUIN) 500 MG tablet, Levaquin 500 MG Oral Tablet QTY: 10 tablet Days: 10 Refills: 0  Written: 02/11/21 Patient Instructions: once a day (Patient not taking: Reported on 03/20/2021), Disp: , Rfl:  .  lidocaine (XYLOCAINE) 2 % solution, Lidocaine Viscous 2 % mucosal solution  TAKE 5 MILLILITERS BY MOUTH 3 TIMES A DAY BEFORE MEALS WILL 10 MILLILITERS OF PETRIACTIN (Patient not taking: Reported on 03/20/2021), Disp: , Rfl:  .  meloxicam (MOBIC) 15 MG tablet, Take 1 tablet (15 mg total) by mouth daily., Disp: 15 tablet, Rfl: 1 .  meperidine (DEMEROL) 50 MG tablet,  meperidine 50 mg tablet  TK 1 OR 2 TS PO Q 4 TO 6 H PRF PAIN (Patient not taking: No sig reported), Disp: , Rfl:  .  metoprolol succinate (TOPROL-XL) 50 MG 24 hr tablet, Take by mouth., Disp: , Rfl:  .  metoprolol tartrate (LOPRESSOR) 50 MG tablet, Take 1 tablet (50 mg total) by mouth daily., Disp: 30 tablet, Rfl: 0 .  mupirocin cream (BACTROBAN) 2 %, Apply 1 application topically 2 (two) times daily., Disp: 15 g, Rfl: 0 .  mupirocin ointment (BACTROBAN) 2 %, Apply 1 application topically 2 (two) times daily., Disp: 22 g, Rfl: 0 .  Na Sulfate-K Sulfate-Mg Sulf 17.5-3.13-1.6 GM/177ML SOLN, Suprep Bowel Prep Kit 17.5 gram-3.13 gram-1.6 gram oral solution  U UTD (Patient not taking: Reported on 03/20/2021), Disp: , Rfl:  .  NEXLIZET 180-10 MG TABS, Take 1 tablet by mouth daily., Disp: , Rfl:  .  olmesartan (BENICAR) 5 MG tablet, , Disp: , Rfl: 1 .  omeprazole (PRILOSEC) 20 MG capsule, omeprazole 20 mg capsule,delayed release  TAKE 1 CAPSULE BY MOUTH EVERY DAY BEFORE MEAL, Disp: , Rfl:  .  ONETOUCH VERIO test strip, 1 each 4 (four) times daily., Disp: , Rfl:  .  OXYGEN, 3 L., Disp: , Rfl:  .  phenazopyridine (PYRIDIUM) 95 MG tablet, Take 1 tablet (95 mg total) by mouth 3 (three) times daily as needed for pain. (Patient not taking: No sig reported), Disp: 6 tablet, Rfl: 0 .  rosuvastatin (CRESTOR) 40 MG tablet, Take by mouth., Disp: , Rfl:  .  traZODone (DESYREL) 50 MG tablet, Take 200 mg by mouth at bedtime. , Disp: , Rfl:  .  Vitamin D, Ergocalciferol, (DRISDOL) 1.25 MG (50000 UNIT) CAPS capsule, Vitamin D (Ergocalciferol) 1.25 MG (50000 UT) Oral Capsule QTY: 6 capsule Days: 42 Refills: 0  Written: 09/29/19 Patient Instructions: Take 1 tablet once weekly for 6 weeks and recheck, Disp: , Rfl:  .  zolpidem (AMBIEN CR) 12.5 MG CR tablet, zolpidem ER 12.5 mg tablet,extended release,multiphase  TK 1 T PO ONCE D HS PRN FOR INSOMNIA, Disp: , Rfl:   Past Medical History: Past Medical History:  Diagnosis Date   . Asthma   . Environmental allergies   . Gestational diabetes     Tobacco Use: Social History   Tobacco Use  Smoking Status Never Smoker  Smokeless Tobacco Never Used    Labs: Recent Review Scientist, physiological    Labs for ITP Cardiac and Pulmonary Rehab Latest Ref Rng & Units 03/25/2013   TCO2 0 - 100 mmol/L 29       Pulmonary Assessment Scores:  Pulmonary Assessment Scores    Row Name 03/25/21 1142         ADL UCSD   SOB Score total 98     Rest 2     Walk 3     Stairs 5     Bath 4     Dress 4     Shop 5           CAT Score   CAT Score 29           mMRC Score   mMRC Score 2            UCSD: Self-administered rating of dyspnea associated with activities of daily living (ADLs) 6-point scale (0 = "not at all" to 5 = "maximal or unable to do because of breathlessness")  Scoring Scores range from 0 to 120.  Minimally important difference is 5 units  CAT: CAT can identify the health impairment of COPD patients and is better correlated with disease progression.  CAT has a scoring range of zero to 40. The CAT score is classified into four groups of low (less than 10), medium (10 - 20), high (21-30) and very high (31-40) based on the impact level of disease on health status. A CAT score over 10 suggests significant symptoms.  A worsening CAT score could be explained by an exacerbation, poor medication adherence, poor inhaler technique, or progression of COPD or comorbid conditions.  CAT MCID is 2 points  mMRC: mMRC (Modified Medical Research Council) Dyspnea Scale is used to assess the degree of baseline functional disability in patients of respiratory disease due to  dyspnea. No minimal important difference is established. A decrease in score of 1 point or greater is considered a positive change.   Pulmonary Function Assessment:  Pulmonary Function Assessment - 03/20/21 1559      Breath   Shortness of Breath Yes;Limiting activity           Exercise Target  Goals: Exercise Program Goal: Individual exercise prescription set using results from initial 6 min walk test and THRR while considering  patient's activity barriers and safety.   Exercise Prescription Goal: Initial exercise prescription builds to 30-45 minutes a day of aerobic activity, 2-3 days per week.  Home exercise guidelines will be given to patient during program as part of exercise prescription that the participant will acknowledge.  Education: Aerobic Exercise: - Group verbal and visual presentation on the components of exercise prescription. Introduces F.I.T.T principle from ACSM for exercise prescriptions.  Reviews F.I.T.T. principles of aerobic exercise including progression. Written material given at graduation. Flowsheet Row Pulmonary Rehab from 05/15/2021 in [facility] Cardiac and Pulmonary Rehab  Date 04/18/21  Educator [place]  Instruction Review Code 1- Verbalizes Understanding      Education: Resistance Exercise: - Group verbal and visual presentation on the components of exercise prescription. Introduces F.I.T.T principle from ACSM for exercise prescriptions  Reviews F.I.T.T. principles of resistance exercise including progression. Written material given at graduation.    Education: Exercise & Equipment Safety: - Individual verbal instruction and demonstration of equipment use and safety with use of the equipment. Flowsheet Row Pulmonary Rehab from 05/15/2021 in [facility] Cardiac and Pulmonary Rehab  Date 03/25/21  Educator [place]  Instruction Review Code 1- Verbalizes Understanding      Education: Exercise Physiology & General Exercise Guidelines: - Group verbal and written instruction with models to review the exercise physiology of the cardiovascular system and associated critical values. Provides general exercise guidelines with specific guidelines to those with heart or lung disease.  Flowsheet Row Pulmonary Rehab from 05/15/2021 in [facility] Cardiac and Pulmonary Rehab  Date 04/10/21   Educator [facility]  Instruction Review Code 1- Verbalizes Understanding      Education: Flexibility, Balance, Mind/Body Relaxation: - Group verbal and visual presentation with interactive activity on the components of exercise prescription. Introduces F.I.T.T principle from ACSM for exercise prescriptions. Reviews F.I.T.T. principles of flexibility and balance exercise training including progression. Also discusses the mind body connection.  Reviews various relaxation techniques to help reduce and manage stress (i.e. Deep breathing, progressive muscle relaxation, and visualization). Balance handout provided to take home. Written material given at graduation. Flowsheet Row Pulmonary Rehab from 05/15/2021 in [facility] Cardiac and Pulmonary Rehab  Date 05/01/21  Educator AS  Instruction Review Code 1- Verbalizes Understanding      Activity Barriers & Risk Stratification:   6 Minute Walk:  6 Minute Walk    Row Name 03/25/21 1119         6 Minute Walk   Phase Initial     Distance 780 feet     Walk Time 6 minutes     # of Rest Breaks 0     MPH 1.48     METS 2.96     RPE 14     Perceived Dyspnea  3     VO2 Peak 10.36     Symptoms Yes (comment)     Comments SOB     Resting HR 94 bpm     Resting BP 118/80     Resting Oxygen Saturation  98 %  Exercise Oxygen Saturation  during 6 min walk 97 %     Max Ex. HR 113 bpm     Max Ex. BP 138/84     2 Minute Post BP 128/82           Interval HR   1 Minute HR 108     2 Minute HR 109     3 Minute HR 111     4 Minute HR 109     5 Minute HR 109     6 Minute HR 113     2 Minute Post HR 102     Interval Heart Rate? Yes           Interval Oxygen   Interval Oxygen? Yes     Baseline Oxygen Saturation % 98 %     1 Minute Oxygen Saturation % 97 %     1 Minute Liters of Oxygen 4 L  pulsed     2 Minute Oxygen Saturation % 97 %     2 Minute Liters of Oxygen 4 L  pulsed     3 Minute Oxygen Saturation % 97 %     3 Minute Liters of Oxygen 4 L   pulsed     4 Minute Oxygen Saturation % 97 %     4 Minute Liters of Oxygen 4 L  pulsed     5 Minute Oxygen Saturation % 98 %     5 Minute Liters of Oxygen 4 L  pulsed     6 Minute Oxygen Saturation % 98 %     6 Minute Liters of Oxygen 4 L  pulsed     2 Minute Post Oxygen Saturation % 97 %     2 Minute Post Liters of Oxygen 4 L           Oxygen Initial Assessment:  Oxygen Initial Assessment - 03/20/21 1556      Home Oxygen   Home Oxygen Device Home Concentrator;E-Tanks;Portable Concentrator    Sleep Oxygen Prescription CPAP;Continuous    Liters per minute 4    Home Exercise Oxygen Prescription Continuous    Liters per minute 4    Home Resting Oxygen Prescription Continuous    Liters per minute 2    Compliance with Home Oxygen Use No   Need a new sleep for CPAP.     Initial 6 min Walk   Oxygen Used Continuous    Liters per minute 4      Program Oxygen Prescription   Program Oxygen Prescription Continuous    Liters per minute 4      Intervention   Short Term Goals To learn and exhibit compliance with exercise, home and travel O2 prescription;To learn and understand importance of monitoring SPO2 with pulse oximeter and demonstrate accurate use of the pulse oximeter.;To learn and understand importance of maintaining oxygen saturations>88%;To learn and demonstrate proper pursed lip breathing techniques or other breathing techniques.;To learn and demonstrate proper use of respiratory medications    Long  Term Goals Exhibits compliance with exercise, home and travel O2 prescription;Verbalizes importance of monitoring SPO2 with pulse oximeter and return demonstration;Maintenance of O2 saturations>88%;Exhibits proper breathing techniques, such as pursed lip breathing or other method taught during program session;Demonstrates proper use of MDI's;Compliance with respiratory medication           Oxygen Re-Evaluation:  Oxygen Re-Evaluation    Row Name 03/27/21 [phone] 04/26/21 0823            Program  Oxygen Prescription   Program Oxygen Prescription Continuous Continuous;Portable Concentrator      Liters per minute 4 2             Home Oxygen   Home Oxygen Device Home Concentrator;E-Tanks;Portable Concentrator Home Concentrator;E-Tanks;Portable Concentrator      Sleep Oxygen Prescription CPAP;Continuous CPAP;Continuous      Liters per minute 4 4      Home Exercise Oxygen Prescription Continuous Continuous      Liters per minute 4 4      Home Resting Oxygen Prescription Continuous Continuous      Liters per minute 2 2      Compliance with Home Oxygen Use No Yes             Goals/Expected Outcomes   Short Term Goals To learn and exhibit compliance with exercise, home and travel O2 prescription;To learn and understand importance of monitoring SPO2 with pulse oximeter and demonstrate accurate use of the pulse oximeter.;To learn and understand importance of maintaining oxygen saturations>88%;To learn and demonstrate proper pursed lip breathing techniques or other breathing techniques. To learn and exhibit compliance with exercise, home and travel O2 prescription;To learn and understand importance of monitoring SPO2 with pulse oximeter and demonstrate accurate use of the pulse oximeter.;To learn and understand importance of maintaining oxygen saturations>88%;To learn and demonstrate proper pursed lip breathing techniques or other breathing techniques.      Long  Term Goals Exhibits compliance with exercise, home and travel O2 prescription;Verbalizes importance of monitoring SPO2 with pulse oximeter and return demonstration;Maintenance of O2 saturations>88%;Exhibits proper breathing techniques, such as pursed lip breathing or other method taught during program session Exhibits compliance with exercise, home and travel O2 prescription;Verbalizes importance of monitoring SPO2 with pulse oximeter and return demonstration;Maintenance of O2 saturations>88%;Exhibits proper breathing  techniques, such as pursed lip breathing or other method taught during program session;Compliance with respiratory medication      Comments Reviewed PLB technique with pt.  Talked about how it works and it's importance in maintaining their exercise saturations. Crystal Gibson is doing  well with her oxygen. She is trying to wean some but gets very tired afterwards.  She is wearing 2L during day except for exercise and will use 4L at night.  She finds that with 4L at night she does not wake gasping for breath and sleeps better.  She continues to check it with her pulse oximeter throughout the day.      Goals/Expected Outcomes Short: Become more profiecient at using PLB.   Long: Become independent at using PLB. Short: continue to try to wean Long: Continue to use pulse oximeter and manage oxygen.             Oxygen Discharge (Final Oxygen Re-Evaluation):  Oxygen Re-Evaluation - 04/26/21 0823      Program Oxygen Prescription   Program Oxygen Prescription Continuous;Portable Concentrator    Liters per minute 2      Home Oxygen   Home Oxygen Device Home Concentrator;E-Tanks;Portable Concentrator    Sleep Oxygen Prescription CPAP;Continuous    Liters per minute 4    Home Exercise Oxygen Prescription Continuous    Liters per minute 4    Home Resting Oxygen Prescription Continuous    Liters per minute 2    Compliance with Home Oxygen Use Yes      Goals/Expected Outcomes   Short Term Goals To learn and exhibit compliance with exercise, home and travel O2 prescription;To learn and understand importance of monitoring SPO2 with pulse oximeter  and demonstrate accurate use of the pulse oximeter.;To learn and understand importance of maintaining oxygen saturations>88%;To learn and demonstrate proper pursed lip breathing techniques or other breathing techniques.    Long  Term Goals Exhibits compliance with exercise, home and travel O2 prescription;Verbalizes importance of monitoring SPO2 with pulse oximeter and  return demonstration;Maintenance of O2 saturations>88%;Exhibits proper breathing techniques, such as pursed lip breathing or other method taught during program session;Compliance with respiratory medication    Comments Crystal Gibson is doing  well with her oxygen. She is trying to wean some but gets very tired afterwards.  She is wearing 2L during day except for exercise and will use 4L at night.  She finds that with 4L at night she does not wake gasping for breath and sleeps better.  She continues to check it with her pulse oximeter throughout the day.    Goals/Expected Outcomes Short: continue to try to wean Long: Continue to use pulse oximeter and manage oxygen.           Initial Exercise Prescription:  Initial Exercise Prescription - 03/25/21 1100      Date of Initial Exercise RX and Referring Provider   Date 03/25/21    Referring Provider [name]      Oxygen   Oxygen Continuous    Liters 4      Treadmill   MPH 1.5    Grade 0.5    Minutes 15    METs 2.25      Recumbant Bike   Level 1    RPM 60    Minutes 15    METs 2.25      NuStep   Level 2    SPM 80    Minutes 15    METs 2.25      Arm Ergometer   Level 1    RPM 25    Minutes 15    METs 2.25      Prescription Details   Frequency (times per week) 3      Intensity   THRR 40-80% of Max Heartrate 125-156    Ratings of Perceived Exertion 11-15    Perceived Dyspnea 0-4      Resistance Training   Training Prescription Yes    Weight 3 lb    Reps 10-15           Perform Capillary Blood Glucose checks as needed.  Exercise Prescription Changes:  Exercise Prescription Changes    Row Name 03/25/21 1100 04/08/21 0800 04/08/21 1100 04/22/21 1300 05/07/21 1400     Response to Exercise   Blood Pressure (Admit) 118/80 -- 140/84 122/80 138/82   Blood Pressure (Exercise) 138/84 -- 122/78 152/82 142/80   Blood Pressure (Exit) 128/82 -- 122/80 120/80 124/70   Heart Rate (Admit) 94 bpm -- 108 bpm 121 bpm 99 bpm   Heart  Rate (Exercise) 113 bpm -- 115 bpm 108 bpm 139 bpm   Heart Rate (Exit) 102 bpm -- 115 bpm 117 bpm 125 bpm   Oxygen Saturation (Admit) 98 % -- 95 % 91 % 99 %   Oxygen Saturation (Exercise) 97 % -- 98 % 98 % 97 %   Oxygen Saturation (Exit) 97 % -- 98 % 99 % 98 %   Rating of Perceived Exertion (Exercise) 14 -- 13 -- 13   Perceived Dyspnea (Exercise) 3 -- 1 -- 3   Symptoms SOB -- SOB -- SOB   Duration -- -- Continue with 30 min of aerobic exercise without signs/symptoms of physical distress.  Continue with 30 min of aerobic exercise without signs/symptoms of physical distress. Continue with 30 min of aerobic exercise without signs/symptoms of physical distress.   Intensity -- -- THRR unchanged THRR unchanged THRR unchanged     Progression   Progression -- -- Continue to progress workloads to maintain intensity without signs/symptoms of physical distress. Continue to progress workloads to maintain intensity without signs/symptoms of physical distress. Continue to progress workloads to maintain intensity without signs/symptoms of physical distress.   Average METs -- -- 2.5 2.5 2.73     Resistance Training   Training Prescription -- -- Yes Yes Yes   Weight -- -- 3 lb 3 lb 5 lb   Reps -- -- 10-15 10-15 10-15     Interval Training   Interval Training -- -- No No No     Oxygen   Oxygen -- -- Continuous Continuous Continuous   Liters -- -- _0 Recumbant Bike   Level -- -- 2.5 -- 3   Minutes -- -- 15 -- 15   METs -- -- 2.8 -- --     NuStep   Level -- -- _1 Minutes -- -- _2 METs -- -- 2.1 2.5 2.8     Arm Ergometer   Level -- -- 1 -- --   Minutes -- -- 15 -- --     REL-XR   Level -- -- -- -- 1   Minutes -- -- -- -- 15   METs -- -- -- -- 2.8     Track   Laps -- -- -- -- 30   Minutes -- -- -- -- 15   METs -- -- -- -- 2.7     Home Exercise Plan   Plans to continue exercise at -- Home (comment)  walking, staff videos Home (comment)  walking, staff videos Home  (comment)  walking, staff videos Home (comment)  walking, staff videos   Frequency -- Add 1 additional day to program exercise sessions. Add 1 additional day to program exercise sessions. Add 1 additional day to program exercise sessions. Add 1 additional day to program exercise sessions.   Initial Home Exercises Provided -- 04/08/21 04/08/21 04/08/21 04/08/21          Exercise Comments:  Exercise Comments    Row Name 03/27/21 0810           Exercise Comments First full day of exercise!  Patient was oriented to gym and equipment including functions, settings, policies, and procedures.  Patient's individual exercise prescription and treatment plan were reviewed.  All starting workloads were established based on the results of the 6 minute walk test done at initial orientation visit.  The plan for exercise progression was also introduced and progression will be customized based on patient's performance and goals.              Exercise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 Management: Provide education and appropriate resources to help participant work on and attain dietary goals.;Weight Management/Obesity: Establish reasonable short term and long term weight goals.;Obesity: Provide education and appropriate resources to help participant work on and attain dietary goals.    Expected Outcomes Short Term: Continue to assess and modify interventions until short term weight is achieved;Long Term: Adherence to nutrition and physical activity/exercise program aimed toward attainment of established weight goal;Understanding recommendations for meals to include 15-35% energy as protein, 25-35% energy from fat, 35-60% energy from carbohydrates, less than 24m of dietary cholesterol, 20-35 gm of total fiber daily;Weight Loss: Understanding of general recommendations for a balanced deficit meal plan, which promotes 1-2 lb weight loss per week and includes a negative energy balance of [phone] kcal/d;Understanding of distribution of calorie intake throughout the day with the consumption of 4-5 meals/snacks    Improve shortness of breath with ADL's Yes    Intervention Provide education, individualized exercise plan and daily activity instruction to help decrease symptoms of SOB with activities of daily living.    Expected Outcomes Short Term: Improve cardiorespiratory fitness to achieve a reduction of symptoms when performing ADLs;Long Term: Be able to perform more ADLs without symptoms or delay the onset of symptoms    Diabetes Yes    Intervention Provide education about signs/symptoms and action to take for hypo/hyperglycemia.;Provide education about proper nutrition, including hydration, and aerobic/resistive exercise prescription along with prescribed medications to achieve blood glucose in normal ranges: Fasting glucose 65-99 mg/dL    Expected Outcomes Short Term: Participant  verbalizes understanding of the signs/symptoms and immediate care of hyper/hypoglycemia, proper foot care and importance of medication, aerobic/resistive exercise and nutrition plan for blood glucose control.;Long Term: Attainment of HbA1C < 7%.    Hypertension Yes    Intervention Provide education on lifestyle modifcations including regular physical activity/exercise, weight management, moderate sodium restriction and increased consumption of fresh fruit, vegetables, and low fat dairy, alcohol moderation, and smoking cessation.;Monitor prescription use compliance.    Expected Outcomes Short Term: Continued assessment and intervention until BP is < 140/954mHG in hypertensive participants. < 130/80106mG in hypertensive participants with diabetes, heart failure or chronic kidney disease.;Long Term: Maintenance of blood pressure at goal levels.    Lipids Yes    Intervention Provide education and support for participant on nutrition & aerobic/resistive exercise along with prescribed medications to achieve LDL <45m48mDL >40mg74m Expected Outcomes Short Term: Participant states understanding of desired cholesterol values and is compliant with medications prescribed. Participant is following exercise prescription and nutrition guidelines.;Long Term: Cholesterol controlled with medications as prescribed, with individualized exercise RX and with personalized nutrition plan. Value goals: LDL < 45mg,23m > 40 mg.           Education:Diabetes - Individual verbal and written instruction to review signs/symptoms of diabetes, desired ranges of glucose level fasting, after meals and with exercise. Acknowledge that pre and post exercise glucose checks will be done for 3 sessions at entry of program. Flowsheet Row [facility]  Date 03/20/21  [facility]- Verbalizes Understanding      Know Your Numbers and Heart Failure: - Group  verbal and visual instruction to discuss disease risk factors for cardiac  and pulmonary disease and treatment options.  Reviews associated critical values for Overweight/Obesity, Hypertension, Cholesterol, and Diabetes.  Discusses basics of heart failure: signs/symptoms and treatments.  Introduces Heart Failure Zone chart for action plan for heart failure.  Written material given at graduation.   Core Components/Risk Factors/Patient Goals Review:   Goals and Risk Factor Review    Row Name 04/26/21 0821             Core Components/Risk Factors/Patient Goals Review   Personal Goals Review Weight Management/Obesity;Hypertension;Diabetes;Improve shortness of breath with ADL's       Review Crystal Gibson is doing well in rehab.  She was bad with her diet last week, but now trying to get back to it again.  She is getting better with her breathing.  She continues to work on blood pressures and sugar control.  Her weight was up after her trip but she wants to get back down again. She is doing more at home and sats are staying up even on room air.  We talked about pushing stamina more first versus without oxygen .       Expected Outcomes Short: Continue to work on breathing and weight loss lOng: COntinue to manage diabetes.              Core Components/Risk Factors/Patient Goals at Discharge (Final Review):   Goals and Risk Factor Review - 04/26/21 0821      Core Components/Risk Factors/Patient Goals Review   Personal Goals Review Weight Management/Obesity;Hypertension;Diabetes;Improve shortness of breath with ADL's    Review Crystal Gibson is doing well in rehab.  She was bad with her diet last week, but now trying to get back to it again.  She is getting better with her breathing.  She continues to work on blood pressures and sugar control.  Her weight was up after her trip but she wants to get back down again. She is doing more at home and sats are staying up even on room air.  We talked about pushing stamina  more first versus without oxygen .    Expected Outcomes Short: Continue to work on breathing and weight loss lOng: COntinue to manage diabetes.           ITP Comments:  ITP Comments    Row Name 03/20/21 1556 03/25/21 1212 03/27/21 0810 04/01/21 1023 04/17/21 0634   ITP Comments Virtual Visit completed. Patient informed on EP and RD appointment and 6 Minute walk test. Patient also informed of patient health questionnaires on My Chart. Patient Verbalizes understanding. Visit diagnosis can be found in [facility] 03/06/2021. Completed 6MWT and gym orientation. Initial ITP created and sent for review to Dr. Emily Filbert, Medical Director. First full day of exercise!  Patient was oriented to gym and equipment including functions, settings, policies, and procedures.  Patient's individual exercise prescription and treatment plan were reviewed.  All starting workloads were established based on the results of the 6 minute walk test done at initial orientation visit.  The plan for exercise progression was also introduced and progression will be customized based on patient's performance and goals. completed initial RD evaluation 30 Day review completed. Medical Director ITP review done, changes made as directed, and signed approval by Medical Director.   Row Name 05/15/21 0801           ITP Comments 30 Day review completed. Medical Director ITP review done, changes made as directed, and signed approval by Medical Director.  Comments:

## 2021-05-17 ENCOUNTER — Other Ambulatory Visit: Payer: Self-pay

## 2021-05-17 ENCOUNTER — Encounter: Payer: Medicaid Other | Admitting: *Deleted

## 2021-05-17 DIAGNOSIS — J1282 Pneumonia due to coronavirus disease 2019: Secondary | ICD-10-CM | POA: Diagnosis not present

## 2021-05-17 DIAGNOSIS — R0600 Dyspnea, unspecified: Secondary | ICD-10-CM | POA: Diagnosis not present

## 2021-05-17 DIAGNOSIS — U071 COVID-19: Secondary | ICD-10-CM

## 2021-05-17 NOTE — Progress Notes (Signed)
Daily Session Note  Patient Details  Name: CRISSY MCCREADIE MRN: [id number] Date of Birth: 11-05-1971 Referring Provider:   Flowsheet Row Pulmonary Rehab from 03/25/2021 in [facility] Cardiac and Pulmonary Rehab  Referring Provider [name]      Encounter Date: 05/17/2021  Check In:  Session Check In - 05/17/21 0842      Check-In   Supervising physician immediately available to respond to emergencies See telemetry face sheet for immediately available ER MD    Location [facility]-Cardiac & Pulmonary Rehab    Staff Present Heath Lark, RN, BSN, CCRP;Joseph Hood RCP,RRT,BSRT;Jessica [place], [place], [place], [place], CCET    Virtual Visit No    Medication changes reported     No    Fall or balance concerns reported    No    Warm-up and Cool-down Performed on first and last piece of equipment    Resistance Training Performed Yes    VAD Patient? No    PAD/SET Patient? No      Pain Assessment   Currently in Pain? No/denies              Social History   Tobacco Use  Smoking Status Never Smoker  Smokeless Tobacco Never Used    Goals Met:  Proper associated with RPD/PD & O2 Sat Independence with exercise equipment Exercise tolerated well No report of cardiac concerns or symptoms  Goals Unmet:  Not Applicable  Comments: Pt able to follow exercise prescription today without complaint.  Will continue to monitor for progression.    Dr. Emily Filbert is Medical Director for [place] [facility] Pulmonary Rehabilitation.

## 2021-05-20 ENCOUNTER — Other Ambulatory Visit: Payer: Self-pay

## 2021-05-20 ENCOUNTER — Encounter: Payer: Medicaid Other | Admitting: *Deleted

## 2021-05-20 DIAGNOSIS — J1282 Pneumonia due to coronavirus disease 2019: Secondary | ICD-10-CM

## 2021-05-20 DIAGNOSIS — U071 COVID-19: Secondary | ICD-10-CM | POA: Diagnosis not present

## 2021-05-20 DIAGNOSIS — R0600 Dyspnea, unspecified: Secondary | ICD-10-CM | POA: Diagnosis not present

## 2021-05-20 NOTE — Progress Notes (Signed)
Daily Session Note  Patient Details  Name: Crystal Gibson MRN: [id number] Date of Birth: 1971-04-04 Referring Provider:   Flowsheet Row Pulmonary Rehab from 03/25/2021 in [facility] Cardiac and Pulmonary Rehab  Referring Provider [name]      Encounter Date: 05/20/2021  Check In:  Session Check In - 05/20/21 0846      Check-In   Supervising physician immediately available to respond to emergencies See telemetry face sheet for immediately available ER MD    Location [facility]-Cardiac & Pulmonary Rehab    Staff Present Heath Lark, RN, BSN, CCRP;Joseph Hood RCP,RRT,BSRT;Kelly [place]. [place], [place], ACSM CEP, Exercise Physiologist    Medication changes reported     No    Fall or balance concerns reported    No    Warm-up and Cool-down Performed on first and last piece of equipment    Resistance Training Performed Yes    VAD Patient? No    PAD/SET Patient? No      Pain Assessment   Currently in Pain? No/denies              Social History   Tobacco Use  Smoking Status Never Smoker  Smokeless Tobacco Never Used    Goals Met:  Proper associated with RPD/PD & O2 Sat Independence with exercise equipment Exercise tolerated well No report of cardiac concerns or symptoms  Goals Unmet:  Not Applicable  Comments: Pt able to follow exercise prescription today without complaint.  Will continue to monitor for progression.    Dr. Emily Filbert is Medical Director for [place].  Dr. Ottie Glazier is Medical Director for [facility] Pulmonary Rehabilitation.

## 2021-05-22 ENCOUNTER — Other Ambulatory Visit: Payer: Self-pay

## 2021-05-22 DIAGNOSIS — J1282 Pneumonia due to coronavirus disease 2019: Secondary | ICD-10-CM

## 2021-05-22 DIAGNOSIS — R0600 Dyspnea, unspecified: Secondary | ICD-10-CM | POA: Diagnosis not present

## 2021-05-22 DIAGNOSIS — U071 COVID-19: Secondary | ICD-10-CM | POA: Diagnosis not present

## 2021-05-22 NOTE — Progress Notes (Addendum)
Daily Session Note  Patient Details  Name: Crystal Gibson MRN: [id number] Date of Birth: 05/07/1971 Referring Provider:   Flowsheet Row Pulmonary Rehab from 03/25/2021 in [facility] Cardiac and Pulmonary Rehab  Referring Provider [name]      Encounter Date: 05/22/2021  Check In:  Session Check In - 05/22/21 0753      Check-In   Supervising physician immediately available to respond to emergencies See telemetry face sheet for immediately available ER MD    Location [facility]-Cardiac & Pulmonary Rehab    Staff Present Birdie Sons, MPA, Elveria Rising, BA, ACSM CEP, Exercise Physiologist;Joseph Tessie Fass RCP,RRT,BSRT    Virtual Visit No    Medication changes reported     No    Fall or balance concerns reported    Yes    Comments fell at home this week but no injury    Tobacco Cessation No Change    Warm-up and Cool-down Performed on first and last piece of equipment    Resistance Training Performed Yes    VAD Patient? No    PAD/SET Patient? No      Pain Assessment   Currently in Pain? No/denies              Social History   Tobacco Use  Smoking Status Never Smoker  Smokeless Tobacco Never Used    Goals Met:  Independence with exercise equipment Exercise tolerated well No report of cardiac concerns or symptoms Strength training completed today  Goals Unmet:  Not Applicable  Comments: Pt able to follow exercise prescription today without complaint.  Will continue to monitor for progression.    Dr. Emily Filbert is Medical Director for [place].  Dr. Ottie Glazier is Medical Director for [facility] Pulmonary Rehabilitation.

## 2021-05-29 ENCOUNTER — Encounter: Payer: Medicaid Other | Attending: Pulmonary Disease | Admitting: *Deleted

## 2021-05-29 ENCOUNTER — Other Ambulatory Visit: Payer: Self-pay

## 2021-05-29 DIAGNOSIS — J1282 Pneumonia due to coronavirus disease 2019: Secondary | ICD-10-CM | POA: Diagnosis not present

## 2021-05-29 DIAGNOSIS — R0600 Dyspnea, unspecified: Secondary | ICD-10-CM | POA: Insufficient documentation

## 2021-05-29 DIAGNOSIS — U071 COVID-19: Secondary | ICD-10-CM | POA: Diagnosis not present

## 2021-05-29 DIAGNOSIS — D709 Neutropenia, unspecified: Secondary | ICD-10-CM | POA: Diagnosis not present

## 2021-05-29 DIAGNOSIS — Z6832 Body mass index (BMI) 32.0-32.9, adult: Secondary | ICD-10-CM | POA: Diagnosis not present

## 2021-05-29 DIAGNOSIS — I2699 Other pulmonary embolism without acute cor pulmonale: Secondary | ICD-10-CM | POA: Diagnosis not present

## 2021-05-29 NOTE — Progress Notes (Signed)
Daily Session Note  Patient Details  Name: Crystal Gibson MRN: [id number] Date of Birth: 1971-07-20 Referring Provider:   Flowsheet Row Pulmonary Rehab from 03/25/2021 in [facility] Cardiac and Pulmonary Rehab  Referring Provider [name]      Encounter Date: 05/29/2021  Check In:  Session Check In - 05/29/21 0842      Check-In   Supervising physician immediately available to respond to emergencies See telemetry face sheet for immediately available ER MD    Location [facility]-Cardiac & Pulmonary Rehab    Staff Present Heath Lark, RN, BSN, CCRP;Melissa [place] RDN, Rowe Pavy, BA, ACSM CEP, Exercise Physiologist    Virtual Visit No    Medication changes reported     No    Fall or balance concerns reported    No    Warm-up and Cool-down Performed on first and last piece of equipment    Resistance Training Performed Yes    VAD Patient? No    PAD/SET Patient? No      Pain Assessment   Currently in Pain? No/denies              Social History   Tobacco Use  Smoking Status Never Smoker  Smokeless Tobacco Never Used    Goals Met:  Proper associated with RPD/PD & O2 Sat Independence with exercise equipment Exercise tolerated well No report of cardiac concerns or symptoms  Goals Unmet:  Not Applicable  Comments: Pt able to follow exercise prescription today without complaint.  Will continue to monitor for progression.    Dr. Emily Filbert is Medical Director for [place].  Dr. Ottie Glazier is Medical Director for [facility] Pulmonary Rehabilitation.

## 2021-06-03 ENCOUNTER — Other Ambulatory Visit: Payer: Self-pay

## 2021-06-03 ENCOUNTER — Encounter: Payer: Medicaid Other | Admitting: *Deleted

## 2021-06-03 DIAGNOSIS — U071 COVID-19: Secondary | ICD-10-CM | POA: Diagnosis not present

## 2021-06-03 DIAGNOSIS — R0600 Dyspnea, unspecified: Secondary | ICD-10-CM

## 2021-06-03 DIAGNOSIS — J1282 Pneumonia due to coronavirus disease 2019: Secondary | ICD-10-CM | POA: Diagnosis not present

## 2021-06-03 NOTE — Progress Notes (Signed)
Daily Session Note  Patient Details  Name: Crystal Gibson MRN: [id number] Date of Birth: November 22, 1971 Referring Provider:   Flowsheet Row Pulmonary Rehab from 03/25/2021 in [facility] Cardiac and Pulmonary Rehab  Referring Provider [name]      Encounter Date: 06/03/2021  Check In:  Session Check In - 06/03/21 1141      Check-In   Location [facility]-Cardiac & Pulmonary Rehab    Staff Present Heath Lark, RN, BSN, CCRP;Joseph Hood RCP,RRT,BSRT;Kelly [place], [place], ACSM CEP, Exercise Physiologist    Medication changes reported     No    Fall or balance concerns reported    No    Warm-up and Cool-down Performed on first and last piece of equipment    Resistance Training Performed Yes    VAD Patient? No    PAD/SET Patient? No      Pain Assessment   Currently in Pain? No/denies              Social History   Tobacco Use  Smoking Status Never Smoker  Smokeless Tobacco Never Used    Goals Met:  Proper associated with RPD/PD & O2 Sat Independence with exercise equipment Exercise tolerated well No report of cardiac concerns or symptoms  Goals Unmet:  Not Applicable  Comments: Pt able to follow exercise prescription today without complaint.  Will continue to monitor for progression. PHQ 9 repeated for score greater than 4. Score today was higher than last score  23    Dr. Emily Filbert is Medical Director for [place].  Dr. Ottie Glazier is Medical Director for [facility] Pulmonary Rehabilitation.

## 2021-06-05 ENCOUNTER — Other Ambulatory Visit: Payer: Self-pay

## 2021-06-05 DIAGNOSIS — R0600 Dyspnea, unspecified: Secondary | ICD-10-CM | POA: Diagnosis not present

## 2021-06-05 DIAGNOSIS — U071 COVID-19: Secondary | ICD-10-CM

## 2021-06-05 DIAGNOSIS — J1282 Pneumonia due to coronavirus disease 2019: Secondary | ICD-10-CM | POA: Diagnosis not present

## 2021-06-05 NOTE — Progress Notes (Signed)
Daily Session Note  Patient Details  Name: Crystal Gibson MRN: [id number] Date of Birth: Jul 11, 1971 Referring Provider:   Flowsheet Row Pulmonary Rehab from 03/25/2021 in [facility] Cardiac and Pulmonary Rehab  Referring Provider [name]      Encounter Date: 06/05/2021  Check In:  Session Check In - 06/05/21 0737      Check-In   Supervising physician immediately available to respond to emergencies See telemetry face sheet for immediately available ER MD    Location [facility]-Cardiac & Pulmonary Rehab    Staff Present Birdie Sons, MPA, Elveria Rising, BA, ACSM CEP, Exercise Physiologist;Joseph Tessie Fass RCP,RRT,BSRT    Virtual Visit No    Medication changes reported     No    Fall or balance concerns reported    No    Tobacco Cessation No Change    Warm-up and Cool-down Performed on first and last piece of equipment    Resistance Training Performed Yes    VAD Patient? No    PAD/SET Patient? No      Pain Assessment   Currently in Pain? No/denies              Social History   Tobacco Use  Smoking Status Never Smoker  Smokeless Tobacco Never Used    Goals Met:  Independence with exercise equipment Exercise tolerated well No report of cardiac concerns or symptoms Strength training completed today  Goals Unmet:  Not Applicable  Comments: Pt able to follow exercise prescription today without complaint.  Will continue to monitor for progression.    Dr. Emily Filbert is Medical Director for [place].  Dr. Ottie Glazier is Medical Director for [facility] Pulmonary Rehabilitation.

## 2021-06-07 ENCOUNTER — Other Ambulatory Visit: Payer: Self-pay

## 2021-06-07 DIAGNOSIS — R0600 Dyspnea, unspecified: Secondary | ICD-10-CM | POA: Diagnosis not present

## 2021-06-07 DIAGNOSIS — U071 COVID-19: Secondary | ICD-10-CM

## 2021-06-07 DIAGNOSIS — J1282 Pneumonia due to coronavirus disease 2019: Secondary | ICD-10-CM | POA: Diagnosis not present

## 2021-06-07 NOTE — Progress Notes (Signed)
Daily Session Note  Patient Details  Name: Crystal Gibson MRN: [id number] Date of Birth: 08/17/1971 Referring Provider:   Flowsheet Row Pulmonary Rehab from 03/25/2021 in [facility] Cardiac and Pulmonary Rehab  Referring Provider [name]       Encounter Date: 06/07/2021  Check In:  Session Check In - 06/07/21 0816       Check-In   Supervising physician immediately available to respond to emergencies See telemetry face sheet for immediately available ER MD    Location [facility]-Cardiac & Pulmonary Rehab    Staff Present Justin Mend RCP,RRT,BSRT;Vida Rigger RN, BSN;Jessica Luan [place], MA, RCEP, CCRP, CCET    Virtual Visit No    Medication changes reported     No    Fall or balance concerns reported    No    Warm-up and Cool-down Performed on first and last piece of equipment    Resistance Training Performed Yes    VAD Patient? No    PAD/SET Patient? No      Pain Assessment   Currently in Pain? No/denies                Social History   Tobacco Use  Smoking Status Never  Smokeless Tobacco Never    Goals Met:  Proper associated with RPD/PD & O2 Sat Independence with exercise equipment Exercise tolerated well No report of cardiac concerns or symptoms Strength training completed today  Goals Unmet:  Not Applicable  Comments: Pt able to follow exercise prescription today without complaint.  Will continue to monitor for progression.   Dr. Emily Filbert is Medical Director for [place].  Dr. Ottie Glazier is Medical Director for [facility] Pulmonary Rehabilitation.

## 2021-06-12 ENCOUNTER — Encounter: Payer: Self-pay | Admitting: *Deleted

## 2021-06-12 DIAGNOSIS — U071 COVID-19: Secondary | ICD-10-CM

## 2021-06-12 DIAGNOSIS — R0600 Dyspnea, unspecified: Secondary | ICD-10-CM

## 2021-06-12 NOTE — Progress Notes (Signed)
Pulmonary Individual Treatment Plan  Patient Details  Name: Crystal Gibson MRN: [id number] Date of Birth: [birth year] Referring Provider:   Flowsheet Row Pulmonary Rehab from 03/25/2021 in [facility] Cardiac and Pulmonary Rehab  Referring Provider [name]       Initial Encounter Date:  Flowsheet Row Pulmonary Rehab from 03/25/2021 in [facility] Cardiac and Pulmonary Rehab  Date 03/25/21       Visit Diagnosis: Pneumonia due to COVID-19 virus  Dyspnea due to COVID-19  Patient's Home Medications on Admission:  Current Outpatient Medications:    ACCU-CHEK FASTCLIX LANCETS MISC, , Disp: , Rfl: 2   acetaminophen (TYLENOL) 500 MG tablet, Take by mouth., Disp: , Rfl:    albuterol (PROVENTIL HFA;VENTOLIN HFA) 108 (90 BASE) MCG/ACT inhaler, Inhale 2 puffs into the lungs every 4 (four) hours as needed for wheezing or shortness of breath., Disp: 1 Inhaler, Rfl: 0   apixaban (ELIQUIS) 5 MG TABS tablet, Take by mouth., Disp: , Rfl:    atorvastatin (LIPITOR) 20 MG tablet, , Disp: , Rfl: 1   BD PEN NEEDLE NANO 2ND GEN 32G X 4 MM MISC, USE THREE TIMES DAILY BEFORE MEALS, Disp: , Rfl:    Blood Glucose Monitoring Suppl (ACCU-CHEK GUIDE) w/Device KIT, USE DEVICE TO CHECK SUGARS DAILY, Disp: , Rfl:    butalbital-acetaminophen-caffeine (FIORICET) 50-325-40 MG tablet, Take by mouth., Disp: , Rfl:    butalbital-acetaminophen-caffeine (FIORICET) 50-325-40 MG tablet, Take 1 tablet by mouth every 4 (four) hours as needed. (Patient not taking: Reported on 03/20/2021), Disp: , Rfl:    cetirizine (ZYRTEC) 10 MG tablet, Take 10 mg by mouth daily. (Patient not taking: Reported on 03/20/2021), Disp: , Rfl:    Cetirizine HCl (ZYRTEC ALLERGY) 10 MG CAPS, Take by mouth., Disp: , Rfl:    chlorthalidone (HYGROTON) 25 MG tablet, Take 25 mg by mouth daily., Disp: , Rfl:    cyclobenzaprine (FLEXERIL) 5 MG tablet, Take 1-2 tablets 3 times daily as needed (Patient not taking: Reported on 03/20/2021), Disp: 20 tablet, Rfl: 0   DULoxetine  (CYMBALTA) 60 MG capsule, TK ONE C PO QD, Disp: , Rfl: 0   etodolac (LODINE) 400 MG tablet, etodolac 400 mg tablet  TK 1 T PO BID (Patient not taking: Reported on 03/20/2021), Disp: , Rfl:    famciclovir (FAMVIR) 500 MG tablet, famciclovir 500 mg tablet  TK 1 T PO BID (Patient not taking: Reported on 03/20/2021), Disp: , Rfl:    famotidine (PEPCID) 20 MG tablet, Take 20 mg by mouth daily., Disp: , Rfl:    FARXIGA 10 MG TABS tablet, Take 10 mg by mouth daily. (Patient not taking: No sig reported), Disp: , Rfl:    fluconazole (DIFLUCAN) 150 MG tablet, fluconazole 150 mg tablet, Disp: , Rfl:    Fluocinolone Acetonide Body 0.01 % OIL, Derma-Smoothe/FS Scalp 0.01% External Oil QTY: 1 mL Days: 30 Refills: 5  Written: 01/16/21 Patient Instructions: Apply thin film to affected areas on scalp every 8 hours, Disp: , Rfl:    Fluocinolone Acetonide Scalp 0.01 % OIL, SMARTSIG:Sparingly Topical Every 8 Hours, Disp: , Rfl:    fluticasone (FLONASE) 50 MCG/ACT nasal spray, Place 1 spray into both nostrils 2 (two) times daily., Disp: 16 g, Rfl: 0   fluticasone (FLOVENT HFA) 110 MCG/ACT inhaler, Inhale into the lungs., Disp: , Rfl:    fluticasone-salmeterol (ADVAIR HFA) 115-21 MCG/ACT inhaler, Inhale 2 puffs into the lungs 2 (two) times daily., Disp: 1 each, Rfl: 2   glipiZIDE (GLUCOTROL XL) 5 MG 24 hr tablet, ,  Disp: , Rfl: 1   glucose blood (ACCU-CHEK GUIDE) test strip, Accu-Chek Guide In Vitro Strip QTY: 200 strip Days: 90 Refills: 1  Written: 04/27/20 Patient Instructions: TEST FASTING BLOOD SUGAR EVERY MORNING FASTING AND EVERY EVENING E11.65, Disp: , Rfl:    glucose blood ([employer] BLOOD GLUCOSE TEST) test strip, Use to check blood sugar as directed with insulin 3 times a day & for symptoms of high or low blood sugar., Disp: , Rfl:    glucose blood test strip, Accu-Chek Active In Vitro Strip QTY: 300 strip Days: 90 Refills: 3  Written: 04/06/19 Patient Instructions: Use with glucometer to check blood sugars daily in AM  fasting and prn for low/high blood sugars - E11.9, Disp: , Rfl:    glucose blood test strip, OneTouch Verio test strips, Disp: , Rfl:    HUMALOG KWIKPEN 100 UNIT/ML KwikPen, 5 Units 3 (three) times daily., Disp: , Rfl:    ibuprofen (ADVIL,MOTRIN) 600 MG tablet, Take 1 tablet (600 mg total) by mouth every 6 (six) hours as needed., Disp: 30 tablet, Rfl: 0   icosapent Ethyl (VASCEPA) 1 g capsule, Vascepa 1 GM Oral Capsule QTY: 120 capsule Days: 30 Refills: 0  Written: 11/09/20 Patient Instructions: Take 2 capsules by mouth daily in AM and 2 capsules in evening, need labs and appt, Disp: , Rfl:    insulin lispro (HUMALOG) 100 UNIT/ML KwikPen, Inject into the skin., Disp: , Rfl:    Insulin NPH, Human,, Isophane, (HUMULIN N KWIKPEN) 100 UNIT/ML Kiwkpen, Inject 20 Units into the skin in the morning and at bedtime., Disp: , Rfl:    Insulin Pen Needle (GLOBAL EASY GLIDE PEN NEEDLES) 32G X 4 MM MISC, Use for injections three (3) times a day before meals., Disp: , Rfl:    ISOtretinoin (ACCUTANE) 30 MG capsule, , Disp: , Rfl:    ketoconazole (NIZORAL) 2 % shampoo, ketoconazole 2 % shampoo  APPLY TO SCALP ONCE WEEKLY AND LET SIT SEVERAL MINUTES BEFORE RINSING, Disp: , Rfl:    Lancets Misc. (ACCU-CHEK FASTCLIX LANCET) KIT, Accu-Chek Fastclix Lancet Drum  USE TO CHECK FASTING IN AM AND AS NEEDED FOR HIGH OR LOW, Disp: , Rfl:    levofloxacin (LEVAQUIN) 500 MG tablet, Levaquin 500 MG Oral Tablet QTY: 10 tablet Days: 10 Refills: 0  Written: 02/11/21 Patient Instructions: once a day (Patient not taking: Reported on 03/20/2021), Disp: , Rfl:    lidocaine (XYLOCAINE) 2 % solution, Lidocaine Viscous 2 % mucosal solution  TAKE 5 MILLILITERS BY MOUTH 3 TIMES A DAY BEFORE MEALS WILL 10 MILLILITERS OF PETRIACTIN (Patient not taking: Reported on 03/20/2021), Disp: , Rfl:    meloxicam (MOBIC) 15 MG tablet, Take 1 tablet (15 mg total) by mouth daily., Disp: 15 tablet, Rfl: 1   meperidine (DEMEROL) 50 MG tablet, meperidine 50 mg  tablet  TK 1 OR 2 TS PO Q 4 TO 6 H PRF PAIN (Patient not taking: No sig reported), Disp: , Rfl:    metoprolol succinate (TOPROL-XL) 50 MG 24 hr tablet, Take by mouth., Disp: , Rfl:    metoprolol tartrate (LOPRESSOR) 50 MG tablet, Take 1 tablet (50 mg total) by mouth daily., Disp: 30 tablet, Rfl: 0   mupirocin cream (BACTROBAN) 2 %, Apply 1 application topically 2 (two) times daily., Disp: 15 g, Rfl: 0   mupirocin ointment (BACTROBAN) 2 %, Apply 1 application topically 2 (two) times daily., Disp: 22 g, Rfl: 0   Na Sulfate-K Sulfate-Mg Sulf 17.5-3.13-1.6 GM/177ML SOLN, Suprep Bowel Prep Kit 17.5  gram-3.13 gram-1.6 gram oral solution  U UTD (Patient not taking: Reported on 03/20/2021), Disp: , Rfl:    NEXLIZET 180-10 MG TABS, Take 1 tablet by mouth daily., Disp: , Rfl:    olmesartan (BENICAR) 5 MG tablet, , Disp: , Rfl: 1   omeprazole (PRILOSEC) 20 MG capsule, omeprazole 20 mg capsule,delayed release  TAKE 1 CAPSULE BY MOUTH EVERY DAY BEFORE MEAL, Disp: , Rfl:    ONETOUCH VERIO test strip, 1 each 4 (four) times daily., Disp: , Rfl:    OXYGEN, 3 L., Disp: , Rfl:    phenazopyridine (PYRIDIUM) 95 MG tablet, Take 1 tablet (95 mg total) by mouth 3 (three) times daily as needed for pain. (Patient not taking: No sig reported), Disp: 6 tablet, Rfl: 0   rosuvastatin (CRESTOR) 40 MG tablet, Take by mouth., Disp: , Rfl:    traZODone (DESYREL) 50 MG tablet, Take 200 mg by mouth at bedtime. , Disp: , Rfl:    Vitamin D, Ergocalciferol, (DRISDOL) 1.25 MG (50000 UNIT) CAPS capsule, Vitamin D (Ergocalciferol) 1.25 MG (50000 UT) Oral Capsule QTY: 6 capsule Days: 42 Refills: 0  Written: 09/29/19 Patient Instructions: Take 1 tablet once weekly for 6 weeks and recheck, Disp: , Rfl:    zolpidem (AMBIEN CR) 12.5 MG CR tablet, zolpidem ER 12.5 mg tablet,extended release,multiphase  TK 1 T PO ONCE D HS PRN FOR INSOMNIA, Disp: , Rfl:   Past Medical History: Past Medical History:  Diagnosis Date   Asthma    Environmental  allergies    Gestational diabetes     Tobacco Use: Social History   Tobacco Use  Smoking Status Never  Smokeless Tobacco Never    Labs: Recent Review Flowsheet Data     Labs for ITP Cardiac and Pulmonary Rehab Latest Ref Rng & Units 03/25/2013   TCO2 0 - 100 mmol/L 29        Pulmonary Assessment Scores:  Pulmonary Assessment Scores     Row Name 03/25/21 1142         ADL UCSD   SOB Score total 98     Rest 2     Walk 3     Stairs 5     Bath 4     Dress 4     Shop 5           CAT Score     CAT Score 29           mMRC Score     mMRC Score 2             UCSD: Self-administered rating of dyspnea associated with activities of daily living (ADLs) 6-point scale (0 = "not at all" to 5 = "maximal or unable to do because of breathlessness")  Scoring Scores range from 0 to 120.  Minimally important difference is 5 units  CAT: CAT can identify the health impairment of COPD patients and is better correlated with disease progression.  CAT has a scoring range of zero to 40. The CAT score is classified into four groups of low (less than 10), medium (10 - 20), high (21-30) and very high (31-40) based on the impact level of disease on health status. A CAT score over 10 suggests significant symptoms.  A worsening CAT score could be explained by an exacerbation, poor medication adherence, poor inhaler technique, or progression of COPD or comorbid conditions.  CAT MCID is 2 points  mMRC: mMRC (Modified Medical Research Council) Dyspnea Scale is used to assess the  degree of baseline functional disability in patients of respiratory disease due to dyspnea. No minimal important difference is established. A decrease in score of 1 point or greater is considered a positive change.   Pulmonary Function Assessment:  Pulmonary Function Assessment - 03/20/21 1559       Breath   Shortness of Breath Yes;Limiting activity             Exercise Target Goals: Exercise Program  Goal: Individual exercise prescription set using results from initial 6 min walk test and THRR while considering  patient's activity barriers and safety.   Exercise Prescription Goal: Initial exercise prescription builds to 30-45 minutes a day of aerobic activity, 2-3 days per week.  Home exercise guidelines will be given to patient during program as part of exercise prescription that the participant will acknowledge.  Education: Aerobic Exercise: - Group verbal and visual presentation on the components of exercise prescription. Introduces F.I.T.T principle from ACSM for exercise prescriptions.  Reviews F.I.T.T. principles of aerobic exercise including progression. Written material given at graduation. Flowsheet Row Pulmonary Rehab from 05/15/2021 in [facility] Cardiac and Pulmonary Rehab  Date 04/18/21  Educator [place]  Instruction Review Code 1- Verbalizes Understanding       Education: Resistance Exercise: - Group verbal and visual presentation on the components of exercise prescription. Introduces F.I.T.T principle from ACSM for exercise prescriptions  Reviews F.I.T.T. principles of resistance exercise including progression. Written material given at graduation.    Education: Exercise & Equipment Safety: - Individual verbal instruction and demonstration of equipment use and safety with use of the equipment. Flowsheet Row Pulmonary Rehab from 05/15/2021 in [facility] Cardiac and Pulmonary Rehab  Date 03/25/21  Educator [place]  Instruction Review Code 1- Verbalizes Understanding       Education: Exercise Physiology & General Exercise Guidelines: - Group verbal and written instruction with models to review the exercise physiology of the cardiovascular system and associated critical values. Provides general exercise guidelines with specific guidelines to those with heart or lung disease.  Flowsheet Row Pulmonary Rehab from 05/15/2021 in [facility] Cardiac and Pulmonary Rehab  Date 04/10/21  Educator [facility]   Instruction Review Code 1- Verbalizes Understanding       Education: Flexibility, Balance, Mind/Body Relaxation: - Group verbal and visual presentation with interactive activity on the components of exercise prescription. Introduces F.I.T.T principle from ACSM for exercise prescriptions. Reviews F.I.T.T. principles of flexibility and balance exercise training including progression. Also discusses the mind body connection.  Reviews various relaxation techniques to help reduce and manage stress (i.e. Deep breathing, progressive muscle relaxation, and visualization). Balance handout provided to take home. Written material given at graduation. Flowsheet Row Pulmonary Rehab from 05/15/2021 in [facility] Cardiac and Pulmonary Rehab  Date 05/01/21  Educator [place]  Instruction Review Code 1- Verbalizes Understanding       Activity Barriers & Risk Stratification:   6 Minute Walk:  6 Minute Walk     Row Name 03/25/21 1119         6 Minute Walk   Phase Initial     Distance 780 feet     Walk Time 6 minutes     # of Rest Breaks 0     MPH 1.48     METS 2.96     RPE 14     Perceived Dyspnea  3     VO2 Peak 10.36     Symptoms Yes (comment)     Comments SOB     Resting HR 94 bpm  Resting BP 118/80     Resting Oxygen Saturation  98 %     Exercise Oxygen Saturation  during 6 min walk 97 %     Max Ex. HR 113 bpm     Max Ex. BP 138/84     2 Minute Post BP 128/82           Interval HR     1 Minute HR 108     2 Minute HR 109     3 Minute HR 111     4 Minute HR 109     5 Minute HR 109     6 Minute HR 113     2 Minute Post HR 102     Interval Heart Rate? Yes           Interval Oxygen     Interval Oxygen? Yes     Baseline Oxygen Saturation % 98 %     1 Minute Oxygen Saturation % 97 %     1 Minute Liters of Oxygen 4 L  pulsed     2 Minute Oxygen Saturation % 97 %     2 Minute Liters of Oxygen 4 L  pulsed     3 Minute Oxygen Saturation % 97 %     3 Minute Liters of Oxygen 4 L  pulsed      4 Minute Oxygen Saturation % 97 %     4 Minute Liters of Oxygen 4 L  pulsed     5 Minute Oxygen Saturation % 98 %     5 Minute Liters of Oxygen 4 L  pulsed     6 Minute Oxygen Saturation % 98 %     6 Minute Liters of Oxygen 4 L  pulsed     2 Minute Post Oxygen Saturation % 97 %     2 Minute Post Liters of Oxygen 4 L            Oxygen Initial Assessment:  Oxygen Initial Assessment - 03/20/21 1556       Home Oxygen   Home Oxygen Device Home Concentrator;E-Tanks;Portable Concentrator    Sleep Oxygen Prescription CPAP;Continuous    Liters per minute 4    Home Exercise Oxygen Prescription Continuous    Liters per minute 4    Home Resting Oxygen Prescription Continuous    Liters per minute 2    Compliance with Home Oxygen Use No   Need a new sleep for CPAP.     Initial 6 min Walk   Oxygen Used Continuous    Liters per minute 4      Program Oxygen Prescription   Program Oxygen Prescription Continuous    Liters per minute 4      Intervention   Short Term Goals To learn and exhibit compliance with exercise, home and travel O2 prescription;To learn and understand importance of monitoring SPO2 with pulse oximeter and demonstrate accurate use of the pulse oximeter.;To learn and understand importance of maintaining oxygen saturations>88%;To learn and demonstrate proper pursed lip breathing techniques or other breathing techniques. ;To learn and demonstrate proper use of respiratory medications    Long  Term Goals Exhibits compliance with exercise, home  and travel O2 prescription;Verbalizes importance of monitoring SPO2 with pulse oximeter and return demonstration;Maintenance of O2 saturations>88%;Exhibits proper breathing techniques, such as pursed lip breathing or other method taught during program session;Demonstrates proper use of MDI's;Compliance with respiratory medication  Oxygen Re-Evaluation:  Oxygen Re-Evaluation     Row Name 03/27/21 0388 04/26/21 0823  06/05/21 0735         Program Oxygen Prescription   Program Oxygen Prescription Continuous Continuous;Portable Concentrator Continuous     Liters per minute _0 Home Oxygen       Home Oxygen Device Home Concentrator;E-Tanks;Portable Concentrator Home Concentrator;E-Tanks;Portable Concentrator Portable Concentrator;Home Concentrator;E-Tanks     Sleep Oxygen Prescription CPAP;Continuous CPAP;Continuous CPAP  getting a sleep study done next week. She states that hers is deffective     Liters per minute _1 Home Exercise Oxygen Prescription Continuous Continuous Continuous     Liters per minute _2 Home Resting Oxygen Prescription Continuous Continuous Continuous     Liters per minute _3 Compliance with Home Oxygen Use No Yes Yes           Goals/Expected Outcomes       Short Term Goals To learn and exhibit compliance with exercise, home and travel O2 prescription;To learn and understand importance of monitoring SPO2 with pulse oximeter and demonstrate accurate use of the pulse oximeter.;To learn and understand importance of maintaining oxygen saturations>88%;To learn and demonstrate proper pursed lip breathing techniques or other breathing techniques.  To learn and exhibit compliance with exercise, home and travel O2 prescription;To learn and understand importance of monitoring SPO2 with pulse oximeter and demonstrate accurate use of the pulse oximeter.;To learn and understand importance of maintaining oxygen saturations>88%;To learn and demonstrate proper pursed lip breathing techniques or other breathing techniques.  Other;To learn and exhibit compliance with exercise, home and travel O2 prescription     Long  Term Goals Exhibits compliance with exercise, home  and travel O2 prescription;Verbalizes importance of monitoring SPO2 with pulse oximeter and return demonstration;Maintenance of O2 saturations>88%;Exhibits proper breathing techniques, such as pursed lip  breathing or other method taught during program session Exhibits compliance with exercise, home  and travel O2 prescription;Verbalizes importance of monitoring SPO2 with pulse oximeter and return demonstration;Maintenance of O2 saturations>88%;Exhibits proper breathing techniques, such as pursed lip breathing or other method taught during program session;Compliance with respiratory medication Exhibits compliance with exercise, home  and travel O2 prescription     Comments Reviewed PLB technique with pt.  Talked about how it works and it's importance in maintaining their exercise saturations. Mar[place] is doing  well with her oxygen. She is trying to wean some but gets very tired afterwards.  She is wearing 2L during day except for exercise and will use 4L at night.  She finds that with 4L at night she does not wake gasping for breath and sleeps better.  She continues to check it with her pulse oximeter throughout the day. [place] started rehab on 5 liters of oxygen and is now on 3 liters when she exercises. She is going for a sleep study next week to see how her sleep apnea is progressing.     Goals/Expected Outcomes Short: Become more profiecient at using PLB.   Long: Become independent at using PLB. Short: continue to try to wean Long: Continue to use pulse oximeter and manage oxygen. Short: go to sleep study. Long: maintain using CPAP machine independently.             Oxygen Discharge (Final Oxygen Re-Evaluation):  Oxygen Re-Evaluation - 06/05/21 0735       Program  Oxygen Prescription   Program Oxygen Prescription Continuous    Liters per minute 3      Home Oxygen   Home Oxygen Device Portable Concentrator;Home Concentrator;E-Tanks    Sleep Oxygen Prescription CPAP   getting a sleep study done next week. She states that hers is deffective   Liters per minute 3    Home Exercise Oxygen Prescription Continuous    Liters per minute 3    Home Resting Oxygen Prescription Continuous    Liters per  minute 2    Compliance with Home Oxygen Use Yes      Goals/Expected Outcomes   Short Term Goals Other;To learn and exhibit compliance with exercise, home and travel O2 prescription    Long  Term Goals Exhibits compliance with exercise, home  and travel O2 prescription    Comments [place] started rehab on 5 liters of oxygen and is now on 3 liters when she exercises. She is going for a sleep study next week to see how her sleep apnea is progressing.    Goals/Expected Outcomes Short: go to sleep study. Long: maintain using CPAP machine independently.             Initial Exercise Prescription:  Initial Exercise Prescription - 03/25/21 1100       Date of Initial Exercise RX and Referring Provider   Date 03/25/21    Referring Provider [name]      Oxygen   Oxygen Continuous    Liters 4      Treadmill   MPH 1.5    Grade 0.5    Minutes 15    METs 2.25      Recumbant Bike   Level 1    RPM 60    Minutes 15    METs 2.25      NuStep   Level 2    SPM 80    Minutes 15    METs 2.25      Arm Ergometer   Level 1    RPM 25    Minutes 15    METs 2.25      Prescription Details   Frequency (times per week) 3      Intensity   THRR 40-80% of Max Heartrate 125-156    Ratings of Perceived Exertion 11-15    Perceived Dyspnea 0-4      Resistance Training   Training Prescription Yes    Weight 3 lb    Reps 10-15             Perform Capillary Blood Glucose checks as needed.  Exercise Prescription Changes:   Exercise Prescription Changes     Row Name 03/25/21 1100 04/08/21 0800 04/08/21 1100 04/22/21 1300 05/07/21 1400     Response to Exercise   Blood Pressure (Admit) 118/80 -- 140/84 122/80 138/82   Blood Pressure (Exercise) 138/84 -- 122/78 152/82 142/80   Blood Pressure (Exit) 128/82 -- 122/80 120/80 124/70   Heart Rate (Admit) 94 bpm -- 108 bpm 121 bpm 99 bpm   Heart Rate (Exercise) 113 bpm -- 115 bpm 108 bpm 139 bpm   Heart Rate (Exit) 102 bpm -- 115 bpm 117  bpm 125 bpm   Oxygen Saturation (Admit) 98 % -- 95 % 91 % 99 %   Oxygen Saturation (Exercise) 97 % -- 98 % 98 % 97 %   Oxygen Saturation (Exit) 97 % -- 98 % 99 % 98 %   Rating of Perceived Exertion (Exercise) 14 -- 13 -- 13  Perceived Dyspnea (Exercise) 3 -- 1 -- 3   Symptoms SOB -- SOB -- SOB   Duration -- -- Continue with 30 min of aerobic exercise without signs/symptoms of physical distress. Continue with 30 min of aerobic exercise without signs/symptoms of physical distress. Continue with 30 min of aerobic exercise without signs/symptoms of physical distress.   Intensity -- -- THRR unchanged THRR unchanged THRR unchanged     Progression   Progression -- -- Continue to progress workloads to maintain intensity without signs/symptoms of physical distress. Continue to progress workloads to maintain intensity without signs/symptoms of physical distress. Continue to progress workloads to maintain intensity without signs/symptoms of physical distress.   Average METs -- -- 2.5 2.5 2.73     Resistance Training   Training Prescription -- -- Yes Yes Yes   Weight -- -- 3 lb 3 lb 5 lb   Reps -- -- 10-15 10-15 10-15     Interval Training   Interval Training -- -- No No No     Oxygen   Oxygen -- -- Continuous Continuous Continuous   Liters -- -- _0 Recumbant Bike   Level -- -- 2.5 -- 3   Minutes -- -- 15 -- 15   METs -- -- 2.8 -- --     NuStep   Level -- -- _1 Minutes -- -- _2 METs -- -- 2.1 2.5 2.8     Arm Ergometer   Level -- -- 1 -- --   Minutes -- -- 15 -- --     REL-XR   Level -- -- -- -- 1   Minutes -- -- -- -- 15   METs -- -- -- -- 2.8     Track   Laps -- -- -- -- 30   Minutes -- -- -- -- 15   METs -- -- -- -- 2.7     Home Exercise Plan   Plans to continue exercise at -- Home (comment)  walking, staff videos Home (comment)  walking, staff videos Home (comment)  walking, staff videos Home (comment)  walking, staff videos   Frequency -- Add 1  additional day to program exercise sessions. Add 1 additional day to program exercise sessions. Add 1 additional day to program exercise sessions. Add 1 additional day to program exercise sessions.   Initial Home Exercises Provided -- 04/08/21 04/08/21 04/08/21 04/08/21    Row Name 05/20/21 1000 06/04/21 1400           Response to Exercise   Blood Pressure (Admit) 138/98 118/80      Blood Pressure (Exercise) 126/64 142/62      Blood Pressure (Exit) 104/66 122/70      Heart Rate (Admit) 100 bpm 90 bpm      Heart Rate (Exercise) 130 bpm 123 bpm      Heart Rate (Exit) 111 bpm 113 bpm      Oxygen Saturation (Admit) 96 % 100 %      Oxygen Saturation (Exercise) 98 % 98 %      Oxygen Saturation (Exit) 98 % 98 %      Rating of Perceived Exertion (Exercise) 14 14      Perceived Dyspnea (Exercise) 3 3      Symptoms SOB SOB      Duration Continue with 30 min of aerobic exercise without signs/symptoms of physical distress. Continue with 30 min of aerobic exercise without signs/symptoms of physical distress.  Intensity THRR unchanged THRR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 AS  Instruction Review Code 1- Verbalizes Understanding       Chronic Lung Disease Review: - Group verbal instruction with posters, models, PowerPoint presentations and videos,  to review new updates, new respiratory medications, new advancements in procedures and treatments. Providing information on websites and "800" numbers for continued self-education. Includes information about supplement oxygen, available portable oxygen systems, continuous and intermittent flow rates, oxygen safety, concentrators, and Medicare reimbursement for oxygen. Explanation of Pulmonary Drugs, including class, frequency, complications, importance of spacers, rinsing mouth after steroid MDI's, and proper cleaning methods for nebulizers. Review of basic lung anatomy and physiology related to function, structure, and complications of lung disease. Review of risk factors. Discussion about methods for diagnosing sleep apnea and types of masks and machines for OSA. Includes a review of the use of types of environmental controls: home humidity, furnaces, filters, dust mite/pet prevention, HEPA vacuums. Discussion about weather changes, air quality and the benefits of nasal washing. Instruction on Warning signs, infection symptoms, calling MD promptly, preventive modes, and value of vaccinations. Review of effective airway clearance, coughing and/or vibration techniques. Emphasizing that all should Create an Action Plan. Written material given at graduation.   AED/CPR: - Group verbal and written instruction with the use of models to demonstrate the basic use of the AED with the basic ABC's of resuscitation.    Anatomy and Cardiac  Procedures: - Group verbal and visual presentation and models provide information about basic cardiac anatomy and function. Reviews the testing methods done to diagnose heart disease and the outcomes of the test results. Describes the treatment choices: Medical Management, Angioplasty, or Coronary Bypass Surgery for treating  various heart conditions including Myocardial Infarction, Angina, Valve Disease, and Cardiac Arrhythmias.  Written material given at graduation.   Medication Safety: - Group verbal and visual instruction to review commonly prescribed medications for heart and lung disease. Reviews the medication, class of the drug, and side effects. Includes the steps to properly store meds and maintain the prescription regimen.  Written material given at graduation. Flowsheet Row Pulmonary Rehab from 05/15/2021 in [facility] Cardiac and Pulmonary Rehab  Date 05/15/21  Educator SB  Instruction Review Code 1- Verbalizes Understanding       Other: -Provides group and verbal instruction on various topics (see comments)   Knowledge Questionnaire Score:  Knowledge Questionnaire Score - 03/25/21 1143       Knowledge Questionnaire Score   Pre Score 15/18              Core Components/Risk Factors/Patient Goals at Admission:  Personal Goals and Risk Factors at Admission - 03/25/21 1208       Core Components/Risk Factors/Patient Goals on Admission    Weight Management Yes;Weight Loss    Intervention Weight Management: Develop a combined nutrition and exercise program designed to reach desired caloric intake, while maintaining appropriate intake of nutrient and fiber, sodium and fats, and appropriate energy expenditure required for the weight goal.;Weight Management: Provide education and appropriate resources to help participant work on and attain dietary goals.;Weight Management/Obesity: Establish reasonable short term and long term weight goals.;Obesity: Provide education and appropriate  resources to help participant work on and attain dietary goals.    Expected Outcomes Short Term: Continue to assess and modify interventions until short term weight is achieved;Long Term: Adherence to nutrition and physical activity/exercise program aimed toward attainment of established weight goal;Understanding recommendations for meals to include 15-35% energy as protein, 25-35% energy from fat, 35-60% energy from carbohydrates, less than 272m of dietary cholesterol, 20-35 gm of total fiber daily;Weight Loss: Understanding of general recommendations for a balanced deficit meal plan, which promotes 1-2 lb weight loss per week and includes a negative energy balance of [phone] kcal/d;Understanding of distribution of calorie intake throughout the day with the consumption of 4-5 meals/snacks    Improve shortness of breath with ADL's Yes    Intervention Provide education, individualized exercise plan and daily activity instruction to help decrease symptoms of SOB with activities of daily living.    Expected Outcomes Short Term: Improve cardiorespiratory fitness to achieve a reduction of symptoms when performing ADLs;Long Term: Be able to perform more ADLs without symptoms or delay the onset of symptoms    Diabetes Yes    Intervention Provide education about signs/symptoms and action to take for hypo/hyperglycemia.;Provide education about proper nutrition, including hydration, and aerobic/resistive exercise prescription along with prescribed medications to achieve blood glucose in normal ranges: Fasting glucose 65-99 mg/dL    Expected Outcomes Short Term: Participant verbalizes understanding of the signs/symptoms and immediate care of hyper/hypoglycemia, proper foot care and importance of medication, aerobic/resistive exercise and nutrition plan for blood glucose control.;Long Term: Attainment of HbA1C < 7%.    Hypertension Yes    Intervention Provide education on lifestyle modifcations including regular  physical activity/exercise, weight management, moderate sodium restriction and increased consumption of fresh fruit, vegetables, and low fat dairy, alcohol moderation, and smoking cessation.;Monitor prescription use compliance.    Expected Outcomes Short Term: Continued assessment and intervention until BP is < 140/913mHG in hypertensive participants. < 130/8036mG in hypertensive participants with diabetes, heart failure or chronic kidney disease.;Long Term: Maintenance of blood  pressure at goal levels.    Lipids Yes    Intervention Provide education and support for participant on nutrition & aerobic/resistive exercise along with prescribed medications to achieve LDL <38m, HDL >493m    Expected Outcomes Short Term: Participant states understanding of desired cholesterol values and is compliant with medications prescribed. Participant is following exercise prescription and nutrition guidelines.;Long Term: Cholesterol controlled with medications as prescribed, with individualized exercise RX and with personalized nutrition plan. Value goals: LDL < 7078mHDL > 40 mg.             Education:Diabetes - Individual verbal and written instruction to review signs/symptoms of diabetes, desired ranges of glucose level fasting, after meals and with exercise. Acknowledge that pre and post exercise glucose checks will be done for 3 sessions at entry of program. Flowsheet [facility]  Date 03/20/21  [facility] Code 1- Verbalizes Understanding       Know Your Numbers and Heart Failure: - Group verbal and visual instruction to discuss disease risk factors for cardiac and pulmonary disease and treatment options.  Reviews associated critical values for Overweight/Obesity, Hypertension, Cholesterol, and Diabetes.  Discusses basics of heart failure: signs/symptoms and treatments.  Introduces Heart Failure Zone chart for action plan  for heart failure.  Written material given at graduation.   Core Components/Risk Factors/Patient Goals Review:   Goals and Risk Factor Review     Row Name 04/26/21 0828502/08/22 0743           Core Components/Risk Factors/Patient Goals Review   Personal Goals Review Weight Management/Obesity;Hypertension;Diabetes;Improve shortness of breath with ADL's Improve shortness of breath with ADL's      Review MarBianey doing well in rehab.  She was bad with her diet last week, but now trying to get back to it again.  She is getting better with her breathing.  She continues to work on blood pressures and sugar control.  Her weight was up after her trip but she wants to get back down again. She is doing more at home and sats are staying up even on room air.  We talked about pushing stamina more first versus without oxygen . Spoke to patient about their shortness of breath and what they can do to improve. Patient has been informed of breathing techniques when starting the program. Patient is informed to tell staff if they have had any med changes and that certain meds they are taking or not taking can be causing shortness of breath.      Expected Outcomes Short: Continue to work on breathing and weight loss lOng: COntinue to manage diabetes. Short: Attend [facility] regularly to improve shortness of breath with ADL's. Long: maintain independence with ADL's               Core Components/Risk Factors/Patient Goals at Discharge (Final Review):   Goals and Risk Factor Review - 06/05/21 0743       Core Components/Risk Factors/Patient Goals Review   Personal Goals Review Improve shortness of breath with ADL's    Review Spoke to patient about their shortness of breath and what they can do to improve. Patient has been informed of breathing techniques when starting the program. Patient is informed to tell staff if they have had any med changes and that certain meds they are taking or not taking can be causing  shortness of breath.    Expected Outcomes Short: Attend [facility] regularly to  improve shortness of breath with ADL's. Long: maintain independence with ADL's             ITP Comments:  ITP Comments     Row Name 03/20/21 1556 03/25/21 1212 03/27/21 0810 04/01/21 1023 04/17/21 0634   ITP Comments Virtual Visit completed. Patient informed on EP and RD appointment and 6 Minute walk test. Patient also informed of patient health questionnaires on My Chart. Patient Verbalizes understanding. Visit diagnosis can be found in [facility] 03/06/2021. Completed 6MWT and gym orientation. Initial ITP created and sent for review to Dr. Emily Filbert, Medical Director. First full day of exercise!  Patient was oriented to gym and equipment including functions, settings, policies, and procedures.  Patient's individual exercise prescription and treatment plan were reviewed.  All starting workloads were established based on the results of the 6 minute walk test done at initial orientation visit.  The plan for exercise progression was also introduced and progression will be customized based on patient's performance and goals. completed initial RD evaluation 30 Day review completed. Medical Director ITP review done, changes made as directed, and signed approval by Medical Director.    [place] Name 05/15/21 0801 06/12/21 0910         ITP Comments 30 Day review completed. Medical Director ITP review done, changes made as directed, and signed approval by Medical Director. 30 Day review completed. Medical Director ITP review done, changes made as directed, and signed approval by Medical Director.               Comments:

## 2021-06-13 ENCOUNTER — Other Ambulatory Visit: Payer: Self-pay

## 2021-06-13 ENCOUNTER — Encounter: Payer: Medicaid Other | Admitting: *Deleted

## 2021-06-13 DIAGNOSIS — U071 COVID-19: Secondary | ICD-10-CM | POA: Diagnosis not present

## 2021-06-13 DIAGNOSIS — J1282 Pneumonia due to coronavirus disease 2019: Secondary | ICD-10-CM | POA: Diagnosis not present

## 2021-06-13 DIAGNOSIS — R0600 Dyspnea, unspecified: Secondary | ICD-10-CM | POA: Diagnosis not present

## 2021-06-13 NOTE — Progress Notes (Signed)
Daily Session Note  Patient Details  Name: Crystal Gibson MRN: [id number] Date of Birth: May 24, 1971 Referring Provider:   Flowsheet Row Pulmonary Rehab from 03/25/2021 in [facility] Cardiac and Pulmonary Rehab  Referring Provider [name]       Encounter Date: 06/13/2021  Check In:  Session Check In - 06/13/21 0842       Check-In   Supervising physician immediately available to respond to emergencies See telemetry face sheet for immediately available ER MD    Location [facility]-Cardiac & Pulmonary Rehab    Staff Present Heath Lark, RN, BSN, Laveda Norman, BS, ACSM CEP, Exercise Physiologist;Amanda Oletta Darter, BA, ACSM CEP, Exercise Physiologist;Joseph Tessie Fass RCP,RRT,BSRT    Virtual Visit No    Medication changes reported     No    Fall or balance concerns reported    No    Warm-up and Cool-down Performed on first and last piece of equipment    Resistance Training Performed Yes    VAD Patient? No    PAD/SET Patient? No      Pain Assessment   Currently in Pain? No/denies                Social History   Tobacco Use  Smoking Status Never  Smokeless Tobacco Never    Goals Met:  Proper associated with RPD/PD & O2 Sat Independence with exercise equipment Exercise tolerated well No report of cardiac concerns or symptoms  Goals Unmet:  Not Applicable  Comments: Pt able to follow exercise prescription today without complaint.  Will continue to monitor for progression.    Dr. Emily Filbert is Medical Director for [place].  Dr. Ottie Glazier is Medical Director for [facility] Pulmonary Rehabilitation.

## 2021-06-14 ENCOUNTER — Encounter: Payer: Medicaid Other | Admitting: *Deleted

## 2021-06-14 ENCOUNTER — Other Ambulatory Visit: Payer: Self-pay

## 2021-06-14 DIAGNOSIS — J1282 Pneumonia due to coronavirus disease 2019: Secondary | ICD-10-CM

## 2021-06-14 DIAGNOSIS — R0600 Dyspnea, unspecified: Secondary | ICD-10-CM | POA: Diagnosis not present

## 2021-06-14 DIAGNOSIS — U071 COVID-19: Secondary | ICD-10-CM

## 2021-06-17 NOTE — Progress Notes (Signed)
Daily Session Note  Patient Details  Name: Crystal Gibson MRN: [id number] Date of Birth: 09-08-1971 Referring Provider:   Flowsheet Row Pulmonary Rehab from 03/25/2021 in [facility] Cardiac and Pulmonary Rehab  Referring Provider [name]       Encounter Date: 06/14/2021  Check In:  Session Check In - 06/17/21 1531       Check-In   Supervising physician immediately available to respond to emergencies See telemetry face sheet for immediately available ER MD    Location [facility]-Cardiac & Pulmonary Rehab    Staff Present Heath Lark, RN, BSN, CCRP;Melissa Caiola RDN, LDN;Joseph Hood RCP,RRT,BSRT    Virtual Visit No    Fall or balance concerns reported    No    Warm-up and Cool-down Performed on first and last piece of equipment    Resistance Training Performed Yes    VAD Patient? No    PAD/SET Patient? No      Pain Assessment   Currently in Pain? No/denies               Exercise Prescription Changes - 06/17/21 1200       Response to Exercise   Blood Pressure (Admit) 118/68    Blood Pressure (Exercise) 130/70    Blood Pressure (Exit) 110/64    Heart Rate (Admit) 108 bpm    Heart Rate (Exercise) 127 bpm    Heart Rate (Exit) 106 bpm    Oxygen Saturation (Admit) 98 %    Oxygen Saturation (Exercise) 97 %    Oxygen Saturation (Exit) 98 %    Rating of Perceived Exertion (Exercise) 13    Perceived Dyspnea (Exercise) 3    Symptoms SOB    Duration Continue with 30 min of aerobic exercise without signs/symptoms of physical distress.    Intensity THRR unchanged      Progression   Progression Continue to progress workloads to maintain intensity without signs/symptoms of physical distress.    Average METs 2.65      Resistance Training   Training Prescription Yes    Weight 5 lb    Reps 10-15      Interval Training   Interval Training No      Oxygen   Oxygen Continuous    Liters 3      Recumbant Bike   Level 3.5    Minutes 15    METs 2.8      NuStep   Level 5     Minutes 15    METs 2.5      Home Exercise Plan   Plans to continue exercise at Home (comment)   walking, staff videos   Frequency Add 1 additional day to program exercise sessions.    Initial Home Exercises Provided 04/08/21             Social History   Tobacco Use  Smoking Status Never  Smokeless Tobacco Never    Goals Met:  Proper associated with RPD/PD & O2 Sat Independence with exercise equipment Exercise tolerated well No report of cardiac concerns or symptoms  Goals Unmet:  Not Applicable  Comments: Pt able to follow exercise prescription today without complaint.  Will continue to monitor for progression.    Dr. Emily Filbert is Medical Director for [place].  Dr. Ottie Glazier is Medical Director for [facility] Pulmonary Rehabilitation.

## 2021-06-19 ENCOUNTER — Other Ambulatory Visit: Payer: Self-pay

## 2021-06-19 DIAGNOSIS — J1282 Pneumonia due to coronavirus disease 2019: Secondary | ICD-10-CM

## 2021-06-19 DIAGNOSIS — U071 COVID-19: Secondary | ICD-10-CM

## 2021-06-19 DIAGNOSIS — R0600 Dyspnea, unspecified: Secondary | ICD-10-CM | POA: Diagnosis not present

## 2021-06-19 NOTE — Progress Notes (Signed)
Daily Session Note  Patient Details  Name: Crystal Gibson MRN: [id number] Date of Birth: September 29, 1971 Referring Provider:   Flowsheet Row Pulmonary Rehab from 03/25/2021 in [facility] Cardiac and Pulmonary Rehab  Referring Provider [name]       Encounter Date: 06/19/2021  Check In:  Session Check In - 06/19/21 0758       Check-In   Supervising physician immediately available to respond to emergencies See telemetry face sheet for immediately available ER MD    Location [facility]-Cardiac & Pulmonary Rehab    Staff Present Birdie Sons, MPA, Elveria Rising, BA, ACSM CEP, Exercise Physiologist;Joseph Tessie Fass RCP,RRT,BSRT    Virtual Visit No    Medication changes reported     No    Fall or balance concerns reported    No    Tobacco Cessation No Change    Warm-up and Cool-down Performed on first and last piece of equipment    Resistance Training Performed Yes    VAD Patient? No    PAD/SET Patient? No      Pain Assessment   Currently in Pain? No/denies                Social History   Tobacco Use  Smoking Status Never  Smokeless Tobacco Never    Goals Met:  Independence with exercise equipment Exercise tolerated well No report of cardiac concerns or symptoms Strength training completed today  Goals Unmet:  Not Applicable  Comments: Pt able to follow exercise prescription today without complaint.  Will continue to monitor for progression.  [place] Name 03/25/21 1119 06/19/21 0932       6 Minute Walk   Phase Initial Discharge    Distance 780 feet 1000 feet    Distance % Change -- 28.2 %    Distance Feet Change -- 220 ft    Walk Time 6 minutes 6 minutes    # of Rest Breaks 0 0    MPH 1.48 1.9    METS 2.96 3.36    RPE 14 15    Perceived Dyspnea  3 3    VO2 Peak 10.36 11.75    Symptoms Yes (comment) Yes (comment)    Comments SOB SOB    Resting HR 94 bpm 108 bpm    Resting BP 118/80 114/76    Resting Oxygen Saturation  98 % 97 %    Exercise Oxygen  Saturation  during 6 min walk 97 % 91 %    Max Ex. HR 113 bpm 126 bpm    Max Ex. BP 138/84 118/76    2 Minute Post BP 128/82 --         Interval HR      1 Minute HR 108 115    2 Minute HR 109 123    3 Minute HR 111 104    4 Minute HR 109 114    5 Minute HR 109 123    6 Minute HR 113 126    2 Minute Post HR 102 --    Interval Heart Rate? Yes Yes         Interval Oxygen      Interval Oxygen? Yes --    Baseline Oxygen Saturation % 98 % --    1 Minute Oxygen Saturation % 97 % --    1 Minute Liters of Oxygen 4 L  pulsed --    2 Minute Oxygen Saturation % 97 % --    2  Minute Liters of Oxygen 4 L  pulsed --    3 Minute Oxygen Saturation % 97 % --    3 Minute Liters of Oxygen 4 L  pulsed --    4 Minute Oxygen Saturation % 97 % --    4 Minute Liters of Oxygen 4 L  pulsed --    5 Minute Oxygen Saturation % 98 % --    5 Minute Liters of Oxygen 4 L  pulsed --    6 Minute Oxygen Saturation % 98 % --    6 Minute Liters of Oxygen 4 L  pulsed --    2 Minute Post Oxygen Saturation % 97 % --    2 Minute Post Liters of Oxygen 4 L --              Dr. Emily Filbert is Medical Director for [facility].  Dr. Ottie Glazier is Medical Director for [facility] Pulmonary Rehabilitation.

## 2021-06-24 ENCOUNTER — Encounter: Payer: Medicaid Other | Admitting: *Deleted

## 2021-06-24 ENCOUNTER — Other Ambulatory Visit: Payer: Self-pay

## 2021-06-24 DIAGNOSIS — J1282 Pneumonia due to coronavirus disease 2019: Secondary | ICD-10-CM

## 2021-06-24 DIAGNOSIS — U071 COVID-19: Secondary | ICD-10-CM

## 2021-06-24 DIAGNOSIS — R0600 Dyspnea, unspecified: Secondary | ICD-10-CM | POA: Diagnosis not present

## 2021-06-24 NOTE — Progress Notes (Signed)
Daily Session Note  Patient Details  Name: Crystal Gibson MRN: [id number] Date of Birth: [birth year] Referring Provider:   Flowsheet Row Pulmonary Rehab from 03/25/2021 in [facility] Cardiac and Pulmonary Rehab  Referring Provider [name]       Encounter Date: 06/24/2021  Check In:  Session Check In - 06/24/21 0913       Check-In   Supervising physician immediately available to respond to emergencies See telemetry face sheet for immediately available ER MD    Location [facility]-Cardiac & Pulmonary Rehab    Staff Present Heath Lark, RN, BSN, Laveda Norman, BS, ACSM CEP, Exercise Physiologist;Joseph Tessie Fass RCP,RRT,BSRT    Virtual Visit No    Medication changes reported     No    Fall or balance concerns reported    No    Warm-up and Cool-down Performed on first and last piece of equipment    Resistance Training Performed Yes    VAD Patient? No    PAD/SET Patient? No      Pain Assessment   Currently in Pain? No/denies                Social History   Tobacco Use  Smoking Status Never  Smokeless Tobacco Never    Goals Met:  Proper associated with RPD/PD & O2 Sat Independence with exercise equipment Exercise tolerated well No report of cardiac concerns or symptoms  Goals Unmet:  Not Applicable  Comments: Pt able to follow exercise prescription today without complaint.  Will continue to monitor for progression.    Dr. Emily Filbert is Medical Director for [place].  Dr. Ottie Glazier is Medical Director for [facility] Pulmonary Rehabilitation.

## 2021-06-26 ENCOUNTER — Other Ambulatory Visit: Payer: Self-pay

## 2021-06-26 DIAGNOSIS — J1282 Pneumonia due to coronavirus disease 2019: Secondary | ICD-10-CM

## 2021-06-26 NOTE — Progress Notes (Signed)
Incomplete Session Note  Patient Details  Name: Crystal Gibson MRN: [id number] Date of Birth: 1971-05-28 Referring Provider:   Flowsheet Row Pulmonary Rehab from 03/25/2021 in [facility] Cardiac and Pulmonary Rehab  Referring Provider Stephan Minister did not complete her rehab session.  Patient advised [name], [place], that she had fallen on Monday and hit her head, heart rate in 180s. Patient stated she saw her doctor and a CT is scheduled for 06/27/21. For patient's safety, patient was advised not to exercise today and wait for clearance to resume after CT scan/further evaluation.  Graduation was offered if she were interested in doing so since she had completed 32 of 36 sessions. Patient was upset she was unable to exercise today and left.

## 2021-06-27 ENCOUNTER — Other Ambulatory Visit: Payer: Self-pay

## 2021-06-27 ENCOUNTER — Ambulatory Visit (INDEPENDENT_AMBULATORY_CARE_PROVIDER_SITE_OTHER): Payer: Medicaid Other | Admitting: Podiatry

## 2021-06-27 ENCOUNTER — Ambulatory Visit (INDEPENDENT_AMBULATORY_CARE_PROVIDER_SITE_OTHER): Payer: Medicaid Other

## 2021-06-27 ENCOUNTER — Encounter: Payer: Self-pay | Admitting: Podiatry

## 2021-06-27 DIAGNOSIS — M7751 Other enthesopathy of right foot: Secondary | ICD-10-CM

## 2021-06-27 DIAGNOSIS — G629 Polyneuropathy, unspecified: Secondary | ICD-10-CM

## 2021-06-27 DIAGNOSIS — M7752 Other enthesopathy of left foot: Secondary | ICD-10-CM

## 2021-06-27 DIAGNOSIS — M79671 Pain in right foot: Secondary | ICD-10-CM

## 2021-06-27 MED ORDER — GABAPENTIN 300 MG PO CAPS: 300.0000 mg | ORAL_CAPSULE | Freq: Three times a day (TID) | ORAL | 3 refills | Status: DC

## 2021-06-27 NOTE — Progress Notes (Signed)
Subjective:   Patient ID: Crystal Gibson, female   DOB: 50 y.o.   MRN: [id number]   HPI Patient states she has generalized pain in both feet but more shooting type discomfort and is concerned about a fall on her right foot several days ago which is caused pain this occurring 4 days ago.  Patient states she did not hear any sounds when it happened   ROS      Objective:  Physical Exam  Neurovascular status intact negative Denna Haggard' sign noted pain on the dorsum of the right foot generalized pain bilateral with nervelike tingling pain noted bilateral     Assessment:  Inflammatory condition with pain secondary to injury right with probable neuropathic-like symptomatology     Plan:  H&P reviewed all conditions x-rays and at this point I am placing her on gabapentin 300 mg and I want her to slowly increase starting 1 at night and 1 morning 1 midday.  Educated her on this educated her on the importance of support therapy and patient will be seen back to recheck  X-rays were negative for signs of fracture or indications of any discs location secondary to her injury

## 2021-06-28 ENCOUNTER — Encounter: Payer: No Typology Code available for payment source | Attending: Pulmonary Disease

## 2021-06-28 DIAGNOSIS — R0600 Dyspnea, unspecified: Secondary | ICD-10-CM | POA: Insufficient documentation

## 2021-06-28 DIAGNOSIS — U071 COVID-19: Secondary | ICD-10-CM | POA: Insufficient documentation

## 2021-06-28 DIAGNOSIS — J1282 Pneumonia due to coronavirus disease 2019: Secondary | ICD-10-CM | POA: Insufficient documentation

## 2021-07-10 ENCOUNTER — Encounter: Payer: Self-pay | Admitting: *Deleted

## 2021-07-10 DIAGNOSIS — J1282 Pneumonia due to coronavirus disease 2019: Secondary | ICD-10-CM

## 2021-07-10 DIAGNOSIS — U071 COVID-19: Secondary | ICD-10-CM

## 2021-07-10 DIAGNOSIS — R0600 Dyspnea, unspecified: Secondary | ICD-10-CM

## 2021-07-10 NOTE — Progress Notes (Signed)
Pulmonary Individual Treatment Plan  Patient Details  Name: Crystal Gibson MRN: [id number] Date of Birth: March 21, 1971 Referring Provider:   Flowsheet Row Pulmonary Rehab from 03/25/2021 in [facility] Cardiac and Pulmonary Rehab  Referring Provider [name]       Initial Encounter Date:  Flowsheet Row Pulmonary Rehab from 03/25/2021 in [facility] Cardiac and Pulmonary Rehab  Date 03/25/21       Visit Diagnosis: Pneumonia due to COVID-19 virus  Dyspnea due to COVID-19  Patient's Home Medications on Admission:  Current Outpatient Medications:    ACCU-CHEK FASTCLIX LANCETS MISC, , Disp: , Rfl: 2   acetaminophen (TYLENOL) 500 MG tablet, Take by mouth., Disp: , Rfl:    albuterol (PROVENTIL HFA;VENTOLIN HFA) 108 (90 BASE) MCG/ACT inhaler, Inhale 2 puffs into the lungs every 4 (four) hours as needed for wheezing or shortness of breath., Disp: 1 Inhaler, Rfl: 0   apixaban (ELIQUIS) 5 MG TABS tablet, Take by mouth., Disp: , Rfl:    atorvastatin (LIPITOR) 20 MG tablet, , Disp: , Rfl: 1   BD PEN NEEDLE NANO 2ND GEN 32G X 4 MM MISC, USE THREE TIMES DAILY BEFORE MEALS, Disp: , Rfl:    Blood Glucose Monitoring Suppl (ACCU-CHEK GUIDE) w/Device KIT, USE DEVICE TO CHECK SUGARS DAILY, Disp: , Rfl:    butalbital-acetaminophen-caffeine (FIORICET) 50-325-40 MG tablet, Take by mouth., Disp: , Rfl:    butalbital-acetaminophen-caffeine (FIORICET) 50-325-40 MG tablet, Take 1 tablet by mouth every 4 (four) hours as needed. (Patient not taking: Reported on 03/20/2021), Disp: , Rfl:    cetirizine (ZYRTEC) 10 MG tablet, Take 10 mg by mouth daily. (Patient not taking: Reported on 03/20/2021), Disp: , Rfl:    Cetirizine HCl (ZYRTEC ALLERGY) 10 MG CAPS, Take by mouth., Disp: , Rfl:    chlorthalidone (HYGROTON) 25 MG tablet, Take 25 mg by mouth daily., Disp: , Rfl:    cyclobenzaprine (FLEXERIL) 5 MG tablet, Take 1-2 tablets 3 times daily as needed (Patient not taking: Reported on 03/20/2021), Disp: 20 tablet, Rfl: 0   DULoxetine  (CYMBALTA) 60 MG capsule, TK ONE C PO QD, Disp: , Rfl: 0   etodolac (LODINE) 400 MG tablet, etodolac 400 mg tablet  TK 1 T PO BID (Patient not taking: Reported on 03/20/2021), Disp: , Rfl:    famciclovir (FAMVIR) 500 MG tablet, famciclovir 500 mg tablet  TK 1 T PO BID (Patient not taking: Reported on 03/20/2021), Disp: , Rfl:    famotidine (PEPCID) 20 MG tablet, Take 20 mg by mouth daily., Disp: , Rfl:    FARXIGA 10 MG TABS tablet, Take 10 mg by mouth daily. (Patient not taking: No sig reported), Disp: , Rfl:    fluconazole (DIFLUCAN) 150 MG tablet, fluconazole 150 mg tablet, Disp: , Rfl:    Fluocinolone Acetonide Body 0.01 % OIL, Derma-Smoothe/FS Scalp 0.01% External Oil QTY: 1 mL Days: 30 Refills: 5  Written: 01/16/21 Patient Instructions: Apply thin film to affected areas on scalp every 8 hours, Disp: , Rfl:    Fluocinolone Acetonide Scalp 0.01 % OIL, SMARTSIG:Sparingly Topical Every 8 Hours, Disp: , Rfl:    fluticasone (FLONASE) 50 MCG/ACT nasal spray, Place 1 spray into both nostrils 2 (two) times daily., Disp: 16 g, Rfl: 0   fluticasone (FLOVENT HFA) 110 MCG/ACT inhaler, Inhale into the lungs., Disp: , Rfl:    fluticasone-salmeterol (ADVAIR HFA) 115-21 MCG/ACT inhaler, Inhale 2 puffs into the lungs 2 (two) times daily., Disp: 1 each, Rfl: 2   gabapentin (NEURONTIN) 300 MG capsule, Take 1 capsule (  300 mg total) by mouth 3 (three) times daily., Disp: 90 capsule, Rfl: 3   glipiZIDE (GLUCOTROL XL) 5 MG 24 hr tablet, , Disp: , Rfl: 1   glucose blood (ACCU-CHEK GUIDE) test strip, Accu-Chek Guide In Vitro Strip QTY: 200 strip Days: 90 Refills: 1  Written: 04/27/20 Patient Instructions: TEST FASTING BLOOD SUGAR EVERY MORNING FASTING AND EVERY EVENING E11.65, Disp: , Rfl:    glucose blood ([employer] BLOOD GLUCOSE TEST) test strip, Use to check blood sugar as directed with insulin 3 times a day & for symptoms of high or low blood sugar., Disp: , Rfl:    glucose blood test strip, Accu-Chek Active In Vitro Strip  QTY: 300 strip Days: 90 Refills: 3  Written: 04/06/19 Patient Instructions: Use with glucometer to check blood sugars daily in AM fasting and prn for low/high blood sugars - E11.9, Disp: , Rfl:    glucose blood test strip, OneTouch Verio test strips, Disp: , Rfl:    HUMALOG KWIKPEN 100 UNIT/ML KwikPen, 5 Units 3 (three) times daily., Disp: , Rfl:    ibuprofen (ADVIL,MOTRIN) 600 MG tablet, Take 1 tablet (600 mg total) by mouth every 6 (six) hours as needed., Disp: 30 tablet, Rfl: 0   icosapent Ethyl (VASCEPA) 1 g capsule, Vascepa 1 GM Oral Capsule QTY: 120 capsule Days: 30 Refills: 0  Written: 11/09/20 Patient Instructions: Take 2 capsules by mouth daily in AM and 2 capsules in evening, need labs and appt, Disp: , Rfl:    insulin lispro (HUMALOG) 100 UNIT/ML KwikPen, Inject into the skin., Disp: , Rfl:    Insulin NPH, Human,, Isophane, (HUMULIN N KWIKPEN) 100 UNIT/ML Kiwkpen, Inject 20 Units into the skin in the morning and at bedtime., Disp: , Rfl:    Insulin Pen Needle (GLOBAL EASY GLIDE PEN NEEDLES) 32G X 4 MM MISC, Use for injections three (3) times a day before meals., Disp: , Rfl:    ISOtretinoin (ACCUTANE) 30 MG capsule, , Disp: , Rfl:    ketoconazole (NIZORAL) 2 % shampoo, ketoconazole 2 % shampoo  APPLY TO SCALP ONCE WEEKLY AND LET SIT SEVERAL MINUTES BEFORE RINSING, Disp: , Rfl:    Lancets Misc. (ACCU-CHEK FASTCLIX LANCET) KIT, Accu-Chek Fastclix Lancet Drum  USE TO CHECK FASTING IN AM AND AS NEEDED FOR HIGH OR LOW, Disp: , Rfl:    levofloxacin (LEVAQUIN) 500 MG tablet, Levaquin 500 MG Oral Tablet QTY: 10 tablet Days: 10 Refills: 0  Written: 02/11/21 Patient Instructions: once a day (Patient not taking: Reported on 03/20/2021), Disp: , Rfl:    lidocaine (XYLOCAINE) 2 % solution, Lidocaine Viscous 2 % mucosal solution  TAKE 5 MILLILITERS BY MOUTH 3 TIMES A DAY BEFORE MEALS WILL 10 MILLILITERS OF PETRIACTIN (Patient not taking: Reported on 03/20/2021), Disp: , Rfl:    meloxicam (MOBIC) 15 MG  tablet, Take 1 tablet (15 mg total) by mouth daily., Disp: 15 tablet, Rfl: 1   meperidine (DEMEROL) 50 MG tablet, meperidine 50 mg tablet  TK 1 OR 2 TS PO Q 4 TO 6 H PRF PAIN (Patient not taking: No sig reported), Disp: , Rfl:    metoprolol succinate (TOPROL-XL) 50 MG 24 hr tablet, Take by mouth., Disp: , Rfl:    metoprolol tartrate (LOPRESSOR) 50 MG tablet, Take 1 tablet (50 mg total) by mouth daily., Disp: 30 tablet, Rfl: 0   mupirocin cream (BACTROBAN) 2 %, Apply 1 application topically 2 (two) times daily., Disp: 15 g, Rfl: 0   mupirocin ointment (BACTROBAN) 2 %, Apply 1  application topically 2 (two) times daily., Disp: 22 g, Rfl: 0   Na Sulfate-K Sulfate-Mg Sulf 17.5-3.13-1.6 GM/177ML SOLN, Suprep Bowel Prep Kit 17.5 gram-3.13 gram-1.6 gram oral solution  U UTD (Patient not taking: Reported on 03/20/2021), Disp: , Rfl:    NEXLIZET 180-10 MG TABS, Take 1 tablet by mouth daily., Disp: , Rfl:    olmesartan (BENICAR) 5 MG tablet, , Disp: , Rfl: 1   omeprazole (PRILOSEC) 20 MG capsule, omeprazole 20 mg capsule,delayed release  TAKE 1 CAPSULE BY MOUTH EVERY DAY BEFORE MEAL, Disp: , Rfl:    ONETOUCH VERIO test strip, 1 each 4 (four) times daily., Disp: , Rfl:    OXYGEN, 3 L., Disp: , Rfl:    phenazopyridine (PYRIDIUM) 95 MG tablet, Take 1 tablet (95 mg total) by mouth 3 (three) times daily as needed for pain. (Patient not taking: No sig reported), Disp: 6 tablet, Rfl: 0   rosuvastatin (CRESTOR) 40 MG tablet, Take by mouth., Disp: , Rfl:    traZODone (DESYREL) 50 MG tablet, Take 200 mg by mouth at bedtime. , Disp: , Rfl:    Vitamin D, Ergocalciferol, (DRISDOL) 1.25 MG (50000 UNIT) CAPS capsule, Vitamin D (Ergocalciferol) 1.25 MG (50000 UT) Oral Capsule QTY: 6 capsule Days: 42 Refills: 0  Written: 09/29/19 Patient Instructions: Take 1 tablet once weekly for 6 weeks and recheck, Disp: , Rfl:    zolpidem (AMBIEN CR) 12.5 MG CR tablet, zolpidem ER 12.5 mg tablet,extended release,multiphase  TK 1 T PO ONCE D  HS PRN FOR INSOMNIA, Disp: , Rfl:   Past Medical History: Past Medical History:  Diagnosis Date   Asthma    Environmental allergies    Gestational diabetes     Tobacco Use: Social History   Tobacco Use  Smoking Status Never  Smokeless Tobacco Never    Labs: Recent Review Flowsheet Data     Labs for ITP Cardiac and Pulmonary Rehab Latest Ref Rng & Units 03/25/2013   TCO2 0 - 100 mmol/L 29        Pulmonary Assessment Scores:  Pulmonary Assessment Scores     Row Name 03/25/21 1142         ADL UCSD   SOB Score total 98     Rest 2     Walk 3     Stairs 5     Bath 4     Dress 4     Shop 5           CAT Score     CAT Score 29           mMRC Score     mMRC Score 2             UCSD: Self-administered rating of dyspnea associated with activities of daily living (ADLs) 6-point scale (0 = "not at all" to 5 = "maximal or unable to do because of breathlessness")  Scoring Scores range from 0 to 120.  Minimally important difference is 5 units  CAT: CAT can identify the health impairment of COPD patients and is better correlated with disease progression.  CAT has a scoring range of zero to 40. The CAT score is classified into four groups of low (less than 10), medium (10 - 20), high (21-30) and very high (31-40) based on the impact level of disease on health status. A CAT score over 10 suggests significant symptoms.  A worsening CAT score could be explained by an exacerbation, poor medication adherence, poor inhaler technique, or progression  of COPD or comorbid conditions.  CAT MCID is 2 points  mMRC: mMRC (Modified Medical Research Council) Dyspnea Scale is used to assess the degree of baseline functional disability in patients of respiratory disease due to dyspnea. No minimal important difference is established. A decrease in score of 1 point or greater is considered a positive change.   Pulmonary Function Assessment:  Pulmonary Function Assessment - 03/20/21  1559       Breath   Shortness of Breath Yes;Limiting activity             Exercise Target Goals: Exercise Program Goal: Individual exercise prescription set using results from initial 6 min walk test and THRR while considering  patient's activity barriers and safety.   Exercise Prescription Goal: Initial exercise prescription builds to 30-45 minutes a day of aerobic activity, 2-3 days per week.  Home exercise guidelines will be given to patient during program as part of exercise prescription that the participant will acknowledge.  Education: Aerobic Exercise: - Group verbal and visual presentation on the components of exercise prescription. Introduces F.I.T.T principle from ACSM for exercise prescriptions.  Reviews F.I.T.T. principles of aerobic exercise including progression. Written material given at graduation. Flowsheet Row Pulmonary Rehab from 05/15/2021 in [facility] Cardiac and Pulmonary Rehab  Date 04/18/21  Educator [place]  Instruction Review Code 1- Verbalizes Understanding       Education: Resistance Exercise: - Group verbal and visual presentation on the components of exercise prescription. Introduces F.I.T.T principle from ACSM for exercise prescriptions  Reviews F.I.T.T. principles of resistance exercise including progression. Written material given at graduation.    Education: Exercise & Equipment Safety: - Individual verbal instruction and demonstration of equipment use and safety with use of the equipment. Flowsheet Row Pulmonary Rehab from 05/15/2021 in [facility] Cardiac and Pulmonary Rehab  Date 03/25/21  Educator [place]  Instruction Review Code 1- Verbalizes Understanding       Education: Exercise Physiology & General Exercise Guidelines: - Group verbal and written instruction with models to review the exercise physiology of the cardiovascular system and associated critical values. Provides general exercise guidelines with specific guidelines to those with heart or lung  disease.  Flowsheet Row Pulmonary Rehab from 05/15/2021 in [facility] Cardiac and Pulmonary Rehab  Date 04/10/21  Educator [facility]  Instruction Review Code 1- Verbalizes Understanding       Education: Flexibility, Balance, Mind/Body Relaxation: - Group verbal and visual presentation with interactive activity on the components of exercise prescription. Introduces F.I.T.T principle from ACSM for exercise prescriptions. Reviews F.I.T.T. principles of flexibility and balance exercise training including progression. Also discusses the mind body connection.  Reviews various relaxation techniques to help reduce and manage stress (i.e. Deep breathing, progressive muscle relaxation, and visualization). Balance handout provided to take home. Written material given at graduation. Flowsheet Row Pulmonary Rehab from 05/15/2021 in [facility] Cardiac and Pulmonary Rehab  Date 05/01/21  Educator [place]  Instruction Review Code 1- Verbalizes Understanding       Activity Barriers & Risk Stratification:   6 Minute Walk:  6 Minute Walk     Row Name 03/25/21 1119 06/19/21 0932       6 Minute Walk   Phase Initial Discharge    Distance 780 feet 1000 feet    Distance % Change -- 28.2 %    Distance Feet Change -- 220 ft    Walk Time 6 minutes 6 minutes    # of Rest Breaks 0 0    MPH 1.48 1.9  METS 2.96 3.36    RPE 14 15    Perceived Dyspnea  3 3    VO2 Peak 10.36 11.75    Symptoms Yes (comment) Yes (comment)    Comments SOB SOB    Resting HR 94 bpm 108 bpm    Resting BP 118/80 114/76    Resting Oxygen Saturation  98 % 97 %    Exercise Oxygen Saturation  during 6 min walk 97 % 91 %    Max Ex. HR 113 bpm 126 bpm    Max Ex. BP 138/84 118/76    2 Minute Post BP 128/82 --         Interval HR      1 Minute HR 108 115    2 Minute HR 109 123    3 Minute HR 111 104    4 Minute HR 109 114    5 Minute HR 109 123    6 Minute HR 113 126    2 Minute Post HR 102 --    Interval Heart Rate? Yes Yes         Interval  Oxygen      Interval Oxygen? Yes --    Baseline Oxygen Saturation % 98 % --    1 Minute Oxygen Saturation % 97 % --    1 Minute Liters of Oxygen 4 L  pulsed --    2 Minute Oxygen Saturation % 97 % --    2 Minute Liters of Oxygen 4 L  pulsed --    3 Minute Oxygen Saturation % 97 % --    3 Minute Liters of Oxygen 4 L  pulsed --    4 Minute Oxygen Saturation % 97 % --    4 Minute Liters of Oxygen 4 L  pulsed --    5 Minute Oxygen Saturation % 98 % --    5 Minute Liters of Oxygen 4 L  pulsed --    6 Minute Oxygen Saturation % 98 % --    6 Minute Liters of Oxygen 4 L  pulsed --    2 Minute Post Oxygen Saturation % 97 % --    2 Minute Post Liters of Oxygen 4 L --           Oxygen Initial Assessment:  Oxygen Initial Assessment - 03/20/21 1556       Home Oxygen   Home Oxygen Device Home Concentrator;E-Tanks;Portable Concentrator    Sleep Oxygen Prescription CPAP;Continuous    Liters per minute 4    Home Exercise Oxygen Prescription Continuous    Liters per minute 4    Home Resting Oxygen Prescription Continuous    Liters per minute 2    Compliance with Home Oxygen Use No   Need a new sleep for CPAP.     Initial 6 min Walk   Oxygen Used Continuous    Liters per minute 4      Program Oxygen Prescription   Program Oxygen Prescription Continuous    Liters per minute 4      Intervention   Short Term Goals To learn and exhibit compliance with exercise, home and travel O2 prescription;To learn and understand importance of monitoring SPO2 with pulse oximeter and demonstrate accurate use of the pulse oximeter.;To learn and understand importance of maintaining oxygen saturations>88%;To learn and demonstrate proper pursed lip breathing techniques or other breathing techniques. ;To learn and demonstrate proper use of respiratory medications    Long  Term Goals Exhibits compliance with  exercise, home  and travel O2 prescription;Verbalizes importance of monitoring SPO2 with pulse oximeter and  return demonstration;Maintenance of O2 saturations>88%;Exhibits proper breathing techniques, such as pursed lip breathing or other method taught during program session;Demonstrates proper use of MDI's;Compliance with respiratory medication             Oxygen Re-Evaluation:  Oxygen Re-Evaluation     Row Name 03/27/21 0109 04/26/21 0823 06/05/21 0735 06/24/21 0816       Program Oxygen Prescription   Program Oxygen Prescription Continuous Continuous;Portable Concentrator Continuous Continuous    Liters per minute _0 Home Oxygen        Home Oxygen Device Home Concentrator;E-Tanks;Portable Concentrator Home Concentrator;E-Tanks;Portable Concentrator Portable Concentrator;Home Concentrator;E-Tanks Portable Concentrator;Home Concentrator;E-Tanks    Sleep Oxygen Prescription CPAP;Continuous CPAP;Continuous CPAP  getting a sleep study done next week. She states that hers is deffective CPAP    Liters per minute _1 Home Exercise Oxygen Prescription Continuous Continuous Continuous Continuous    Liters per minute _2 Home Resting Oxygen Prescription Continuous Continuous Continuous Continuous    Liters per minute _3 Compliance with Home Oxygen Use No Yes Yes Yes         Goals/Expected Outcomes        Short Term Goals To learn and exhibit compliance with exercise, home and travel O2 prescription;To learn and understand importance of monitoring SPO2 with pulse oximeter and demonstrate accurate use of the pulse oximeter.;To learn and understand importance of maintaining oxygen saturations>88%;To learn and demonstrate proper pursed lip breathing techniques or other breathing techniques.  To learn and exhibit compliance with exercise, home and travel O2 prescription;To learn and understand importance of monitoring SPO2 with pulse oximeter and demonstrate accurate use of the pulse oximeter.;To learn and understand importance of maintaining oxygen saturations>88%;To  learn and demonstrate proper pursed lip breathing techniques or other breathing techniques.  Other;To learn and exhibit compliance with exercise, home and travel O2 prescription Other;To learn and exhibit compliance with exercise, home and travel O2 prescription    Long  Term Goals Exhibits compliance with exercise, home  and travel O2 prescription;Verbalizes importance of monitoring SPO2 with pulse oximeter and return demonstration;Maintenance of O2 saturations>88%;Exhibits proper breathing techniques, such as pursed lip breathing or other method taught during program session Exhibits compliance with exercise, home  and travel O2 prescription;Verbalizes importance of monitoring SPO2 with pulse oximeter and return demonstration;Maintenance of O2 saturations>88%;Exhibits proper breathing techniques, such as pursed lip breathing or other method taught during program session;Compliance with respiratory medication Exhibits compliance with exercise, home  and travel O2 prescription Exhibits compliance with exercise, home  and travel O2 prescription    Comments Reviewed PLB technique with pt.  Talked about how it works and it's importance in maintaining their exercise saturations. Merlina is doing  well with her oxygen. She is trying to wean some but gets very tired afterwards.  She is wearing 2L during day except for exercise and will use 4L at night.  She finds that with 4L at night she does not wake gasping for breath and sleeps better.  She continues to check it with her pulse oximeter throughout the day. Tyra started rehab on 5 liters of oxygen and is now on 3 liters when she exercises. She is going for a sleep study next week to see how her sleep apnea is progressing. Rosann Auerbach  uses oxygen at home as directed.  She can make it up the stairs at home on her own.  She can get up the stairs, take a shower and get dressed in 1 1/2 hours where it used to take her 3 hours.    Goals/Expected Outcomes Short: Become more  profiecient at using PLB.   Long: Become independent at using PLB. Short: continue to try to wean Long: Continue to use pulse oximeter and manage oxygen. Short: go to sleep study. Long: maintain using CPAP machine independently. Short: get sleep study Long:  maintain compliance with oxygen            Oxygen Discharge (Final Oxygen Re-Evaluation):  Oxygen Re-Evaluation - 06/24/21 0816       Program Oxygen Prescription   Program Oxygen Prescription Continuous    Liters per minute 3      Home Oxygen   Home Oxygen Device Portable Concentrator;Home Concentrator;E-Tanks    Sleep Oxygen Prescription CPAP    Liters per minute 3    Home Exercise Oxygen Prescription Continuous    Liters per minute 3    Home Resting Oxygen Prescription Continuous    Liters per minute 2    Compliance with Home Oxygen Use Yes      Goals/Expected Outcomes   Short Term Goals Other;To learn and exhibit compliance with exercise, home and travel O2 prescription    Long  Term Goals Exhibits compliance with exercise, home  and travel O2 prescription    Comments Svetlana uses oxygen at home as directed.  She can make it up the stairs at home on her own.  She can get up the stairs, take a shower and get dressed in 1 1/2 hours where it used to take her 3 hours.    Goals/Expected Outcomes Short: get sleep study Long:  maintain compliance with oxygen             Initial Exercise Prescription:  Initial Exercise Prescription - 03/25/21 1100       Date of Initial Exercise RX and Referring Provider   Date 03/25/21    Referring Provider [name]      Oxygen   Oxygen Continuous    Liters 4      Treadmill   MPH 1.5    Grade 0.5    Minutes 15    METs 2.25      Recumbant Bike   Level 1    RPM 60    Minutes 15    METs 2.25      NuStep   Level 2    SPM 80    Minutes 15    METs 2.25      Arm Ergometer   Level 1    RPM 25    Minutes 15    METs 2.25      Prescription Details   Frequency (times per  week) 3      Intensity   THRR 40-80% of Max Heartrate 125-156    Ratings of Perceived Exertion 11-15    Perceived Dyspnea 0-4      Resistance Training   Training Prescription Yes    Weight 3 lb    Reps 10-15             Perform Capillary Blood Glucose checks as needed.  Exercise Prescription Changes:   Exercise Prescription Changes     Row Name 03/25/21 1100 04/08/21 0800 04/08/21 1100 04/22/21 1300 05/07/21 1400     Response to Exercise  Blood Pressure (Admit) 118/80 -- 140/84 122/80 138/82   Blood Pressure (Exercise) 138/84 -- 122/78 152/82 142/80   Blood Pressure (Exit) 128/82 -- 122/80 120/80 124/70   Heart Rate (Admit) 94 bpm -- 108 bpm 121 bpm 99 bpm   Heart Rate (Exercise) 113 bpm -- 115 bpm 108 bpm 139 bpm   Heart Rate (Exit) 102 bpm -- 115 bpm 117 bpm 125 bpm   Oxygen Saturation (Admit) 98 % -- 95 % 91 % 99 %   Oxygen Saturation (Exercise) 97 % -- 98 % 98 % 97 %   Oxygen Saturation (Exit) 97 % -- 98 % 99 % 98 %   Rating of Perceived Exertion (Exercise) 14 -- 13 -- 13   Perceived Dyspnea (Exercise) 3 -- 1 -- 3   Symptoms SOB -- SOB -- SOB   Duration -- -- Continue with 30 min of aerobic exercise without signs/symptoms of physical distress. Continue with 30 min of aerobic exercise without signs/symptoms of physical distress. Continue with 30 min of aerobic exercise without signs/symptoms of physical distress.   Intensity -- -- THRR unchanged THRR unchanged THRR unchanged     Progression   Progression -- -- Continue to progress workloads to maintain intensity without signs/symptoms of physical distress. Continue to progress workloads to maintain intensity without signs/symptoms of physical distress. Continue to progress workloads to maintain intensity without signs/symptoms of physical distress.   Average METs -- -- 2.5 2.5 2.73     Resistance Training   Training Prescription -- -- Yes Yes Yes   Weight -- -- 3 lb 3 lb 5 lb   Reps -- -- 10-15 10-15 10-15      Interval Training   Interval Training -- -- No No No     Oxygen   Oxygen -- -- Continuous Continuous Continuous   Liters -- -- _0 Recumbant Bike   Level -- -- 2.5 -- 3   Minutes -- -- 15 -- 15   METs -- -- 2.8 -- --     NuStep   Level -- -- _1 Minutes -- -- _2 METs -- -- 2.1 2.5 2.8     Arm Ergometer   Level -- -- 1 -- --   Minutes -- -- 15 -- --     REL-XR   Level -- -- -- -- 1   Minutes -- -- -- -- 15   METs -- -- -- -- 2.8     Track   Laps -- -- -- -- 30   Minutes -- -- -- -- 15   METs -- -- -- -- 2.7     Home Exercise Plan   Plans to continue exercise at -- Home (comment)  walking, staff videos Home (comment)  walking, staff videos Home (comment)  walking, staff videos Home (comment)  walking, staff videos   Frequency -- Add 1 additional day to program exercise sessions. Add 1 additional day to program exercise sessions. Add 1 additional day to program exercise sessions. Add 1 additional day to program exercise sessions.   Initial Home Exercises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 program.  Score declined and patient states that it is because she gets dizzy and tired. 89 mom has been in the hospital since last Thursday.  Her mom is bipolar and has had other issues.  Miyah reports being drained and tired.  She is trying to get a job.  Kazi does take medication as directed.  She has seen a counselor and meets once a month.    Expected Outcomes ST: talk to counselor LT: reduce stressors and create new support system Short: Continue to work on job search Long: Continue to focus on positive. Short: Continue to work toward an improvement in [place] scores by attending [facility] regularly. Long: Continue to improve stress and depression coping skills by talking with staff and attending [facility] regularly and work toward a positive mental state. Short: continue to take meds as directed  Long:  continue to work on stress by exercising and working towards positive mental outlook    Interventions Encouraged to attend [facility] for the exercise -- Encouraged to attend [facility] for the exercise --    Continue Psychosocial Services  Follow up required by staff -- Follow up required by staff --         Initial Review        Source of Stress Concerns Family;Financial -- -- --            Psychosocial Discharge (Final Psychosocial Re-Evaluation):  Psychosocial Re-Evaluation - 06/24/21 0807       Psychosocial Re-Evaluation   Current issues with Current Depression;History of Depression;Current Psychotropic Meds;Current Stress Concerns    Comments Angy's mom has been in the hospital since last Thursday.  Her mom is bipolar and has had other issues.  Priya reports being drained and tired.  She is trying to get a job.  Lorae does take medication as  directed.  She has seen a  counselor and meets once a month.    Expected Outcomes Short: continue to take meds as directed  Long:  continue to work on stress by exercising and working towards positive mental outlook             Education: Education Goals: Education classes will be provided on a weekly basis, covering required topics. Participant will state understanding/return demonstration of topics presented.  Learning Barriers/Preferences:  Learning Barriers/Preferences - 03/20/21 1559       Learning Barriers/Preferences   Learning Barriers None    Learning Preferences None             General Pulmonary Education Topics:  Infection Prevention: - Provides verbal and written material to individual with discussion of infection control including proper hand washing and proper equipment cleaning during exercise session. Flowsheet Row Pulmonary Rehab from 05/15/2021 in [facility] Cardiac and Pulmonary Rehab  Date 03/25/21  Educator AS  Instruction Review Code 1- Verbalizes Understanding       Falls Prevention: - Provides verbal and written material to individual with discussion of falls prevention and safety. Flowsheet Row Pulmonary Rehab from 05/15/2021 in [facility] Cardiac and Pulmonary Rehab  Date 03/25/21  Educator AS  Instruction Review Code 1- Verbalizes Understanding       Chronic Lung Disease Review: - Group verbal instruction with posters, models, PowerPoint presentations and videos,  to review new updates, new respiratory medications, new advancements in procedures and treatments. Providing information on websites and "800" numbers for continued self-education. Includes information about supplement oxygen, available portable oxygen systems, continuous and intermittent flow rates, oxygen safety, concentrators, and Medicare reimbursement for oxygen. Explanation of Pulmonary Drugs, including class, frequency, complications, importance of spacers, rinsing mouth after steroid MDI's, and proper cleaning  methods for nebulizers. Review of basic lung anatomy and physiology related to function, structure, and complications of lung disease. Review of risk factors. Discussion about methods for diagnosing sleep apnea and types of masks and machines for OSA. Includes a review of the use of types of environmental controls: home humidity, furnaces, filters, dust mite/pet prevention, HEPA vacuums. Discussion about weather changes, air quality and the benefits of nasal washing. Instruction on Warning signs, infection symptoms, calling MD promptly, preventive modes, and value of vaccinations. Review of effective airway clearance, coughing and/or vibration techniques. Emphasizing that all should Create an Action Plan. Written material given at graduation.   AED/CPR: - Group verbal and written instruction with the use of models to demonstrate the basic use of the AED with the basic ABC's of resuscitation.    Anatomy and Cardiac Procedures: - Group verbal and visual presentation and models provide information about basic cardiac anatomy and function. Reviews the testing methods done to diagnose heart disease and the outcomes of the test results. Describes the treatment choices: Medical Management, Angioplasty, or Coronary Bypass Surgery for treating various heart conditions including Myocardial Infarction, Angina, Valve Disease, and Cardiac Arrhythmias.  Written material given at graduation.   Medication Safety: - Group verbal and visual instruction to review commonly prescribed medications for heart and lung disease. Reviews the medication, class of the drug, and side effects. Includes the steps to properly store meds and maintain the prescription regimen.  Written material given at graduation. Flowsheet Row Pulmonary Rehab from 05/15/2021 in [facility] Cardiac and Pulmonary Rehab  Date 05/15/21  Educator SB  Instruction Review Code 1- Verbalizes Understanding       Other: -Provides group and verbal instruction on  various  topics (see comments)   Knowledge Questionnaire Score:  Knowledge Questionnaire Score - 03/25/21 1143       Knowledge Questionnaire Score   Pre Score 15/18              Core Components/Risk Factors/Patient Goals at Admission:  Personal Goals and Risk Factors at Admission - 03/25/21 1208       Core Components/Risk Factors/Patient Goals on Admission    Weight Management Yes;Weight Loss    Intervention Weight Management: Develop a combined nutrition and exercise program designed to reach desired caloric intake, while maintaining appropriate intake of nutrient and fiber, sodium and fats, and appropriate energy expenditure required for the weight goal.;Weight Management: Provide education and appropriate resources to help participant work on and attain dietary goals.;Weight Management/Obesity: Establish reasonable short term and long term weight goals.;Obesity: Provide education and appropriate resources to help participant work on and attain dietary goals.    Expected Outcomes Short Term: Continue to assess and modify interventions until short term weight is achieved;Long Term: Adherence to nutrition and physical activity/exercise program aimed toward attainment of established weight goal;Understanding recommendations for meals to include 15-35% energy as protein, 25-35% energy from fat, 35-60% energy from carbohydrates, less than 244m of dietary cholesterol, 20-35 gm of total fiber daily;Weight Loss: Understanding of general recommendations for a balanced deficit meal plan, which promotes 1-2 lb weight loss per week and includes a negative energy balance of [phone] kcal/d;Understanding of distribution of calorie intake throughout the day with the consumption of 4-5 meals/snacks    Improve shortness of breath with ADL's Yes    Intervention Provide education, individualized exercise plan and daily activity instruction to help decrease symptoms of SOB with activities of daily living.     Expected Outcomes Short Term: Improve cardiorespiratory fitness to achieve a reduction of symptoms when performing ADLs;Long Term: Be able to perform more ADLs without symptoms or delay the onset of symptoms    Diabetes Yes    Intervention Provide education about signs/symptoms and action to take for hypo/hyperglycemia.;Provide education about proper nutrition, including hydration, and aerobic/resistive exercise prescription along with prescribed medications to achieve blood glucose in normal ranges: Fasting glucose 65-99 mg/dL    Expected Outcomes Short Term: Participant verbalizes understanding of the signs/symptoms and immediate care of hyper/hypoglycemia, proper foot care and importance of medication, aerobic/resistive exercise and nutrition plan for blood glucose control.;Long Term: Attainment of HbA1C < 7%.    Hypertension Yes    Intervention Provide education on lifestyle modifcations including regular physical activity/exercise, weight management, moderate sodium restriction and increased consumption of fresh fruit, vegetables, and low fat dairy, alcohol moderation, and smoking cessation.;Monitor prescription use compliance.    Expected Outcomes Short Term: Continued assessment and intervention until BP is < 140/983mHG in hypertensive participants. < 130/8060mG in hypertensive participants with diabetes, heart failure or chronic kidney disease.;Long Term: Maintenance of blood pressure at goal levels.    Lipids Yes    Intervention Provide education and support for participant on nutrition & aerobic/resistive exercise along with prescribed medications to achieve LDL <36m72mDL >40mg80m Expected Outcomes Short Term: Participant states understanding of desired cholesterol values and is compliant with medications prescribed. Participant is following exercise prescription and nutrition guidelines.;Long Term: Cholesterol controlled with medications as prescribed, with individualized exercise RX and with  personalized nutrition plan. Value goals: LDL < 36mg,32m > 40 mg.             Education:Diabetes - Individual verbal and written  instruction to review signs/symptoms of diabetes, desired ranges of glucose level fasting, after meals and with exercise. Acknowledge that pre and post exercise glucose checks will be done for 3 sessions at entry of program. Flowsheet Row Pulmonary Rehab from 03/20/2021 in [facility] Cardiac and Pulmonary Rehab  Date 03/20/21  Educator [facility]  Instruction Review Code 1- Verbalizes Understanding       Know Your Numbers and Heart Failure: - Group verbal and visual instruction to discuss disease risk factors for cardiac and pulmonary disease and treatment options.  Reviews associated critical values for Overweight/Obesity, Hypertension, Cholesterol, and Diabetes.  Discusses basics of heart failure: signs/symptoms and treatments.  Introduces Heart Failure Zone chart for action plan for heart failure.  Written material given at graduation.   Core Components/Risk Factors/Patient Goals Review:   Goals and Risk Factor Review     Row Name 04/26/21 7371 06/05/21 0743 06/24/21 0813         Core Components/Risk Factors/Patient Goals Review   Personal Goals Review Weight Management/Obesity;Hypertension;Diabetes;Improve shortness of breath with ADL's Improve shortness of breath with ADL's Improve shortness of breath with ADL's     Review Rheda is doing well in rehab.  She was bad with her diet last week, but now trying to get back to it again.  She is getting better with her breathing.  She continues to work on blood pressures and sugar control.  Her weight was up after her trip but she wants to get back down again. She is doing more at home and sats are staying up even on room air.  We talked about pushing stamina more first versus without oxygen . Spoke to patient about their shortness of breath and what they can do to improve. Patient has been informed of breathing techniques  when starting the program. Patient is informed to tell staff if they have had any med changes and that certain meds they are taking or not taking can be causing shortness of breath. Winfred can tell her SOB has improved with things like going to bathroom and walking a distance.  She can walk all the way down to [place] now without stopping to rest.     Expected Outcomes Short: Continue to work on breathing and weight loss lOng: COntinue to manage diabetes. Short: Attend [facility] regularly to improve shortness of breath with ADL's. Long: maintain independence with ADL's Short :continue to exercise Long:  maintain exercise on her own              Core Components/Risk Factors/Patient Goals at Discharge (Final Review):   Goals and Risk Factor Review - 06/24/21 0813       Core Components/Risk Factors/Patient Goals Review   Personal Goals Review Improve shortness of breath with ADL's    Review Allure can tell her SOB has improved with things like going to bathroom and walking a distance.  She can walk all the way down to [place] now without stopping to rest.    Expected Outcomes Short :continue to exercise Long:  maintain exercise on her own             ITP Comments:  ITP Comments     Row Name 03/20/21 1556 03/25/21 1212 03/27/21 0810 04/01/21 1023 04/17/21 0634   ITP Comments Virtual Visit completed. Patient informed on EP and RD appointment and 6 Minute walk test. Patient also informed of patient health questionnaires on My Chart. Patient Verbalizes understanding. Visit diagnosis can be found in [facility] 03/06/2021. Completed 6MWT and gym  orientation. Initial ITP created and sent for review to Dr. Emily Filbert, Medical Director. First full day of exercise!  Patient was oriented to gym and equipment including functions, settings, policies, and procedures.  Patient's individual exercise prescription and treatment plan were reviewed.  All starting workloads were established based on the results of the 6 minute  walk test done at initial orientation visit.  The plan for exercise progression was also introduced and progression will be customized based on patient's performance and goals. completed initial RD evaluation 30 Day review completed. Medical Director ITP review done, changes made as directed, and signed approval by Medical Director.    [place] Name 05/15/21 0801 06/12/21 0910 07/10/21 1143       ITP Comments 30 Day review completed. Medical Director ITP review done, changes made as directed, and signed approval by Medical Director. 30 Day review completed. Medical Director ITP review done, changes made as directed, and signed approval by Medical Director. Gay's physician has released her to CR with no restrictions.   Gay stated that she was told it may be scar tissue causing the pain.              Comments:

## 2021-07-10 NOTE — Progress Notes (Signed)
Pulmonary Individual Treatment Plan  Patient Details  Name: Crystal Gibson MRN: [id number] Date of Birth: March 21, 1971 Referring Provider:   Flowsheet Row Pulmonary Rehab from 03/25/2021 in [facility] Cardiac and Pulmonary Rehab  Referring Provider [name]       Initial Encounter Date:  Flowsheet Row Pulmonary Rehab from 03/25/2021 in [facility] Cardiac and Pulmonary Rehab  Date 03/25/21       Visit Diagnosis: Pneumonia due to COVID-19 virus  Dyspnea due to COVID-19  Patient's Home Medications on Admission:  Current Outpatient Medications:    ACCU-CHEK FASTCLIX LANCETS MISC, , Disp: , Rfl: 2   acetaminophen (TYLENOL) 500 MG tablet, Take by mouth., Disp: , Rfl:    albuterol (PROVENTIL HFA;VENTOLIN HFA) 108 (90 BASE) MCG/ACT inhaler, Inhale 2 puffs into the lungs every 4 (four) hours as needed for wheezing or shortness of breath., Disp: 1 Inhaler, Rfl: 0   apixaban (ELIQUIS) 5 MG TABS tablet, Take by mouth., Disp: , Rfl:    atorvastatin (LIPITOR) 20 MG tablet, , Disp: , Rfl: 1   BD PEN NEEDLE NANO 2ND GEN 32G X 4 MM MISC, USE THREE TIMES DAILY BEFORE MEALS, Disp: , Rfl:    Blood Glucose Monitoring Suppl (ACCU-CHEK GUIDE) w/Device KIT, USE DEVICE TO CHECK SUGARS DAILY, Disp: , Rfl:    butalbital-acetaminophen-caffeine (FIORICET) 50-325-40 MG tablet, Take by mouth., Disp: , Rfl:    butalbital-acetaminophen-caffeine (FIORICET) 50-325-40 MG tablet, Take 1 tablet by mouth every 4 (four) hours as needed. (Patient not taking: Reported on 03/20/2021), Disp: , Rfl:    cetirizine (ZYRTEC) 10 MG tablet, Take 10 mg by mouth daily. (Patient not taking: Reported on 03/20/2021), Disp: , Rfl:    Cetirizine HCl (ZYRTEC ALLERGY) 10 MG CAPS, Take by mouth., Disp: , Rfl:    chlorthalidone (HYGROTON) 25 MG tablet, Take 25 mg by mouth daily., Disp: , Rfl:    cyclobenzaprine (FLEXERIL) 5 MG tablet, Take 1-2 tablets 3 times daily as needed (Patient not taking: Reported on 03/20/2021), Disp: 20 tablet, Rfl: 0   DULoxetine  (CYMBALTA) 60 MG capsule, TK ONE C PO QD, Disp: , Rfl: 0   etodolac (LODINE) 400 MG tablet, etodolac 400 mg tablet  TK 1 T PO BID (Patient not taking: Reported on 03/20/2021), Disp: , Rfl:    famciclovir (FAMVIR) 500 MG tablet, famciclovir 500 mg tablet  TK 1 T PO BID (Patient not taking: Reported on 03/20/2021), Disp: , Rfl:    famotidine (PEPCID) 20 MG tablet, Take 20 mg by mouth daily., Disp: , Rfl:    FARXIGA 10 MG TABS tablet, Take 10 mg by mouth daily. (Patient not taking: No sig reported), Disp: , Rfl:    fluconazole (DIFLUCAN) 150 MG tablet, fluconazole 150 mg tablet, Disp: , Rfl:    Fluocinolone Acetonide Body 0.01 % OIL, Derma-Smoothe/FS Scalp 0.01% External Oil QTY: 1 mL Days: 30 Refills: 5  Written: 01/16/21 Patient Instructions: Apply thin film to affected areas on scalp every 8 hours, Disp: , Rfl:    Fluocinolone Acetonide Scalp 0.01 % OIL, SMARTSIG:Sparingly Topical Every 8 Hours, Disp: , Rfl:    fluticasone (FLONASE) 50 MCG/ACT nasal spray, Place 1 spray into both nostrils 2 (two) times daily., Disp: 16 g, Rfl: 0   fluticasone (FLOVENT HFA) 110 MCG/ACT inhaler, Inhale into the lungs., Disp: , Rfl:    fluticasone-salmeterol (ADVAIR HFA) 115-21 MCG/ACT inhaler, Inhale 2 puffs into the lungs 2 (two) times daily., Disp: 1 each, Rfl: 2   gabapentin (NEURONTIN) 300 MG capsule, Take 1 capsule (  300 mg total) by mouth 3 (three) times daily., Disp: 90 capsule, Rfl: 3   glipiZIDE (GLUCOTROL XL) 5 MG 24 hr tablet, , Disp: , Rfl: 1   glucose blood (ACCU-CHEK GUIDE) test strip, Accu-Chek Guide In Vitro Strip QTY: 200 strip Days: 90 Refills: 1  Written: 04/27/20 Patient Instructions: TEST FASTING BLOOD SUGAR EVERY MORNING FASTING AND EVERY EVENING E11.65, Disp: , Rfl:    glucose blood ([employer] BLOOD GLUCOSE TEST) test strip, Use to check blood sugar as directed with insulin 3 times a day & for symptoms of high or low blood sugar., Disp: , Rfl:    glucose blood test strip, Accu-Chek Active In Vitro Strip  QTY: 300 strip Days: 90 Refills: 3  Written: 04/06/19 Patient Instructions: Use with glucometer to check blood sugars daily in AM fasting and prn for low/high blood sugars - E11.9, Disp: , Rfl:    glucose blood test strip, OneTouch Verio test strips, Disp: , Rfl:    HUMALOG KWIKPEN 100 UNIT/ML KwikPen, 5 Units 3 (three) times daily., Disp: , Rfl:    ibuprofen (ADVIL,MOTRIN) 600 MG tablet, Take 1 tablet (600 mg total) by mouth every 6 (six) hours as needed., Disp: 30 tablet, Rfl: 0   icosapent Ethyl (VASCEPA) 1 g capsule, Vascepa 1 GM Oral Capsule QTY: 120 capsule Days: 30 Refills: 0  Written: 11/09/20 Patient Instructions: Take 2 capsules by mouth daily in AM and 2 capsules in evening, need labs and appt, Disp: , Rfl:    insulin lispro (HUMALOG) 100 UNIT/ML KwikPen, Inject into the skin., Disp: , Rfl:    Insulin NPH, Human,, Isophane, (HUMULIN N KWIKPEN) 100 UNIT/ML Kiwkpen, Inject 20 Units into the skin in the morning and at bedtime., Disp: , Rfl:    Insulin Pen Needle (GLOBAL EASY GLIDE PEN NEEDLES) 32G X 4 MM MISC, Use for injections three (3) times a day before meals., Disp: , Rfl:    ISOtretinoin (ACCUTANE) 30 MG capsule, , Disp: , Rfl:    ketoconazole (NIZORAL) 2 % shampoo, ketoconazole 2 % shampoo  APPLY TO SCALP ONCE WEEKLY AND LET SIT SEVERAL MINUTES BEFORE RINSING, Disp: , Rfl:    Lancets Misc. (ACCU-CHEK FASTCLIX LANCET) KIT, Accu-Chek Fastclix Lancet Drum  USE TO CHECK FASTING IN AM AND AS NEEDED FOR HIGH OR LOW, Disp: , Rfl:    levofloxacin (LEVAQUIN) 500 MG tablet, Levaquin 500 MG Oral Tablet QTY: 10 tablet Days: 10 Refills: 0  Written: 02/11/21 Patient Instructions: once a day (Patient not taking: Reported on 03/20/2021), Disp: , Rfl:    lidocaine (XYLOCAINE) 2 % solution, Lidocaine Viscous 2 % mucosal solution  TAKE 5 MILLILITERS BY MOUTH 3 TIMES A DAY BEFORE MEALS WILL 10 MILLILITERS OF PETRIACTIN (Patient not taking: Reported on 03/20/2021), Disp: , Rfl:    meloxicam (MOBIC) 15 MG  tablet, Take 1 tablet (15 mg total) by mouth daily., Disp: 15 tablet, Rfl: 1   meperidine (DEMEROL) 50 MG tablet, meperidine 50 mg tablet  TK 1 OR 2 TS PO Q 4 TO 6 H PRF PAIN (Patient not taking: No sig reported), Disp: , Rfl:    metoprolol succinate (TOPROL-XL) 50 MG 24 hr tablet, Take by mouth., Disp: , Rfl:    metoprolol tartrate (LOPRESSOR) 50 MG tablet, Take 1 tablet (50 mg total) by mouth daily., Disp: 30 tablet, Rfl: 0   mupirocin cream (BACTROBAN) 2 %, Apply 1 application topically 2 (two) times daily., Disp: 15 g, Rfl: 0   mupirocin ointment (BACTROBAN) 2 %, Apply 1  application topically 2 (two) times daily., Disp: 22 g, Rfl: 0   Na Sulfate-K Sulfate-Mg Sulf 17.5-3.13-1.6 GM/177ML SOLN, Suprep Bowel Prep Kit 17.5 gram-3.13 gram-1.6 gram oral solution  U UTD (Patient not taking: Reported on 03/20/2021), Disp: , Rfl:    NEXLIZET 180-10 MG TABS, Take 1 tablet by mouth daily., Disp: , Rfl:    olmesartan (BENICAR) 5 MG tablet, , Disp: , Rfl: 1   omeprazole (PRILOSEC) 20 MG capsule, omeprazole 20 mg capsule,delayed release  TAKE 1 CAPSULE BY MOUTH EVERY DAY BEFORE MEAL, Disp: , Rfl:    ONETOUCH VERIO test strip, 1 each 4 (four) times daily., Disp: , Rfl:    OXYGEN, 3 L., Disp: , Rfl:    phenazopyridine (PYRIDIUM) 95 MG tablet, Take 1 tablet (95 mg total) by mouth 3 (three) times daily as needed for pain. (Patient not taking: No sig reported), Disp: 6 tablet, Rfl: 0   rosuvastatin (CRESTOR) 40 MG tablet, Take by mouth., Disp: , Rfl:    traZODone (DESYREL) 50 MG tablet, Take 200 mg by mouth at bedtime. , Disp: , Rfl:    Vitamin D, Ergocalciferol, (DRISDOL) 1.25 MG (50000 UNIT) CAPS capsule, Vitamin D (Ergocalciferol) 1.25 MG (50000 UT) Oral Capsule QTY: 6 capsule Days: 42 Refills: 0  Written: 09/29/19 Patient Instructions: Take 1 tablet once weekly for 6 weeks and recheck, Disp: , Rfl:    zolpidem (AMBIEN CR) 12.5 MG CR tablet, zolpidem ER 12.5 mg tablet,extended release,multiphase  TK 1 T PO ONCE D  HS PRN FOR INSOMNIA, Disp: , Rfl:   Past Medical History: Past Medical History:  Diagnosis Date   Asthma    Environmental allergies    Gestational diabetes     Tobacco Use: Social History   Tobacco Use  Smoking Status Never  Smokeless Tobacco Never    Labs: Recent Review Flowsheet Data     Labs for ITP Cardiac and Pulmonary Rehab Latest Ref Rng & Units 03/25/2013   TCO2 0 - 100 mmol/L 29        Pulmonary Assessment Scores:  Pulmonary Assessment Scores     Row Name 03/25/21 1142         ADL UCSD   SOB Score total 98     Rest 2     Walk 3     Stairs 5     Bath 4     Dress 4     Shop 5           CAT Score     CAT Score 29           mMRC Score     mMRC Score 2             UCSD: Self-administered rating of dyspnea associated with activities of daily living (ADLs) 6-point scale (0 = "not at all" to 5 = "maximal or unable to do because of breathlessness")  Scoring Scores range from 0 to 120.  Minimally important difference is 5 units  CAT: CAT can identify the health impairment of COPD patients and is better correlated with disease progression.  CAT has a scoring range of zero to 40. The CAT score is classified into four groups of low (less than 10), medium (10 - 20), high (21-30) and very high (31-40) based on the impact level of disease on health status. A CAT score over 10 suggests significant symptoms.  A worsening CAT score could be explained by an exacerbation, poor medication adherence, poor inhaler technique, or progression  of COPD or comorbid conditions.  CAT MCID is 2 points  mMRC: mMRC (Modified Medical Research Council) Dyspnea Scale is used to assess the degree of baseline functional disability in patients of respiratory disease due to dyspnea. No minimal important difference is established. A decrease in score of 1 point or greater is considered a positive change.   Pulmonary Function Assessment:  Pulmonary Function Assessment - 03/20/21  1559       Breath   Shortness of Breath Yes;Limiting activity             Exercise Target Goals: Exercise Program Goal: Individual exercise prescription set using results from initial 6 min walk test and THRR while considering  patient's activity barriers and safety.   Exercise Prescription Goal: Initial exercise prescription builds to 30-45 minutes a day of aerobic activity, 2-3 days per week.  Home exercise guidelines will be given to patient during program as part of exercise prescription that the participant will acknowledge.  Education: Aerobic Exercise: - Group verbal and visual presentation on the components of exercise prescription. Introduces F.I.T.T principle from ACSM for exercise prescriptions.  Reviews F.I.T.T. principles of aerobic exercise including progression. Written material given at graduation. Flowsheet Row Pulmonary Rehab from 05/15/2021 in [facility] Cardiac and Pulmonary Rehab  Date 04/18/21  Educator [place]  Instruction Review Code 1- Verbalizes Understanding       Education: Resistance Exercise: - Group verbal and visual presentation on the components of exercise prescription. Introduces F.I.T.T principle from ACSM for exercise prescriptions  Reviews F.I.T.T. principles of resistance exercise including progression. Written material given at graduation.    Education: Exercise & Equipment Safety: - Individual verbal instruction and demonstration of equipment use and safety with use of the equipment. Flowsheet Row Pulmonary Rehab from 05/15/2021 in [facility] Cardiac and Pulmonary Rehab  Date 03/25/21  Educator [place]  Instruction Review Code 1- Verbalizes Understanding       Education: Exercise Physiology & General Exercise Guidelines: - Group verbal and written instruction with models to review the exercise physiology of the cardiovascular system and associated critical values. Provides general exercise guidelines with specific guidelines to those with heart or lung  disease.  Flowsheet Row Pulmonary Rehab from 05/15/2021 in [facility] Cardiac and Pulmonary Rehab  Date 04/10/21  Educator [facility]  Instruction Review Code 1- Verbalizes Understanding       Education: Flexibility, Balance, Mind/Body Relaxation: - Group verbal and visual presentation with interactive activity on the components of exercise prescription. Introduces F.I.T.T principle from ACSM for exercise prescriptions. Reviews F.I.T.T. principles of flexibility and balance exercise training including progression. Also discusses the mind body connection.  Reviews various relaxation techniques to help reduce and manage stress (i.e. Deep breathing, progressive muscle relaxation, and visualization). Balance handout provided to take home. Written material given at graduation. Flowsheet Row Pulmonary Rehab from 05/15/2021 in [facility] Cardiac and Pulmonary Rehab  Date 05/01/21  Educator [place]  Instruction Review Code 1- Verbalizes Understanding       Activity Barriers & Risk Stratification:   6 Minute Walk:  6 Minute Walk     Row Name 03/25/21 1119 06/19/21 0932       6 Minute Walk   Phase Initial Discharge    Distance 780 feet 1000 feet    Distance % Change -- 28.2 %    Distance Feet Change -- 220 ft    Walk Time 6 minutes 6 minutes    # of Rest Breaks 0 0    MPH 1.48 1.9  METS 2.96 3.36    RPE 14 15    Perceived Dyspnea  3 3    VO2 Peak 10.36 11.75    Symptoms Yes (comment) Yes (comment)    Comments SOB SOB    Resting HR 94 bpm 108 bpm    Resting BP 118/80 114/76    Resting Oxygen Saturation  98 % 97 %    Exercise Oxygen Saturation  during 6 min walk 97 % 91 %    Max Ex. HR 113 bpm 126 bpm    Max Ex. BP 138/84 118/76    2 Minute Post BP 128/82 --         Interval HR      1 Minute HR 108 115    2 Minute HR 109 123    3 Minute HR 111 104    4 Minute HR 109 114    5 Minute HR 109 123    6 Minute HR 113 126    2 Minute Post HR 102 --    Interval Heart Rate? Yes Yes         Interval  Oxygen      Interval Oxygen? Yes --    Baseline Oxygen Saturation % 98 % --    1 Minute Oxygen Saturation % 97 % --    1 Minute Liters of Oxygen 4 L  pulsed --    2 Minute Oxygen Saturation % 97 % --    2 Minute Liters of Oxygen 4 L  pulsed --    3 Minute Oxygen Saturation % 97 % --    3 Minute Liters of Oxygen 4 L  pulsed --    4 Minute Oxygen Saturation % 97 % --    4 Minute Liters of Oxygen 4 L  pulsed --    5 Minute Oxygen Saturation % 98 % --    5 Minute Liters of Oxygen 4 L  pulsed --    6 Minute Oxygen Saturation % 98 % --    6 Minute Liters of Oxygen 4 L  pulsed --    2 Minute Post Oxygen Saturation % 97 % --    2 Minute Post Liters of Oxygen 4 L --           Oxygen Initial Assessment:  Oxygen Initial Assessment - 03/20/21 1556       Home Oxygen   Home Oxygen Device Home Concentrator;E-Tanks;Portable Concentrator    Sleep Oxygen Prescription CPAP;Continuous    Liters per minute 4    Home Exercise Oxygen Prescription Continuous    Liters per minute 4    Home Resting Oxygen Prescription Continuous    Liters per minute 2    Compliance with Home Oxygen Use No   Need a new sleep for CPAP.     Initial 6 min Walk   Oxygen Used Continuous    Liters per minute 4      Program Oxygen Prescription   Program Oxygen Prescription Continuous    Liters per minute 4      Intervention   Short Term Goals To learn and exhibit compliance with exercise, home and travel O2 prescription;To learn and understand importance of monitoring SPO2 with pulse oximeter and demonstrate accurate use of the pulse oximeter.;To learn and understand importance of maintaining oxygen saturations>88%;To learn and demonstrate proper pursed lip breathing techniques or other breathing techniques. ;To learn and demonstrate proper use of respiratory medications    Long  Term Goals Exhibits compliance with  exercise, home  and travel O2 prescription;Verbalizes importance of monitoring SPO2 with pulse oximeter and  return demonstration;Maintenance of O2 saturations>88%;Exhibits proper breathing techniques, such as pursed lip breathing or other method taught during program session;Demonstrates proper use of MDI's;Compliance with respiratory medication             Oxygen Re-Evaluation:  Oxygen Re-Evaluation     Row Name 03/27/21 0109 04/26/21 0823 06/05/21 0735 06/24/21 0816       Program Oxygen Prescription   Program Oxygen Prescription Continuous Continuous;Portable Concentrator Continuous Continuous    Liters per minute _0 Home Oxygen        Home Oxygen Device Home Concentrator;E-Tanks;Portable Concentrator Home Concentrator;E-Tanks;Portable Concentrator Portable Concentrator;Home Concentrator;E-Tanks Portable Concentrator;Home Concentrator;E-Tanks    Sleep Oxygen Prescription CPAP;Continuous CPAP;Continuous CPAP  getting a sleep study done next week. She states that hers is deffective CPAP    Liters per minute _1 Home Exercise Oxygen Prescription Continuous Continuous Continuous Continuous    Liters per minute _2 Home Resting Oxygen Prescription Continuous Continuous Continuous Continuous    Liters per minute _3 Compliance with Home Oxygen Use No Yes Yes Yes         Goals/Expected Outcomes        Short Term Goals To learn and exhibit compliance with exercise, home and travel O2 prescription;To learn and understand importance of monitoring SPO2 with pulse oximeter and demonstrate accurate use of the pulse oximeter.;To learn and understand importance of maintaining oxygen saturations>88%;To learn and demonstrate proper pursed lip breathing techniques or other breathing techniques.  To learn and exhibit compliance with exercise, home and travel O2 prescription;To learn and understand importance of monitoring SPO2 with pulse oximeter and demonstrate accurate use of the pulse oximeter.;To learn and understand importance of maintaining oxygen saturations>88%;To  learn and demonstrate proper pursed lip breathing techniques or other breathing techniques.  Other;To learn and exhibit compliance with exercise, home and travel O2 prescription Other;To learn and exhibit compliance with exercise, home and travel O2 prescription    Long  Term Goals Exhibits compliance with exercise, home  and travel O2 prescription;Verbalizes importance of monitoring SPO2 with pulse oximeter and return demonstration;Maintenance of O2 saturations>88%;Exhibits proper breathing techniques, such as pursed lip breathing or other method taught during program session Exhibits compliance with exercise, home  and travel O2 prescription;Verbalizes importance of monitoring SPO2 with pulse oximeter and return demonstration;Maintenance of O2 saturations>88%;Exhibits proper breathing techniques, such as pursed lip breathing or other method taught during program session;Compliance with respiratory medication Exhibits compliance with exercise, home  and travel O2 prescription Exhibits compliance with exercise, home  and travel O2 prescription    Comments Reviewed PLB technique with pt.  Talked about how it works and it's importance in maintaining their exercise saturations. Merlina is doing  well with her oxygen. She is trying to wean some but gets very tired afterwards.  She is wearing 2L during day except for exercise and will use 4L at night.  She finds that with 4L at night she does not wake gasping for breath and sleeps better.  She continues to check it with her pulse oximeter throughout the day. Tyra started rehab on 5 liters of oxygen and is now on 3 liters when she exercises. She is going for a sleep study next week to see how her sleep apnea is progressing. Rosann Auerbach  uses oxygen at home as directed.  She can make it up the stairs at home on her own.  She can get up the stairs, take a shower and get dressed in 1 1/2 hours where it used to take her 3 hours.    Goals/Expected Outcomes Short: Become more  profiecient at using PLB.   Long: Become independent at using PLB. Short: continue to try to wean Long: Continue to use pulse oximeter and manage oxygen. Short: go to sleep study. Long: maintain using CPAP machine independently. Short: get sleep study Long:  maintain compliance with oxygen            Oxygen Discharge (Final Oxygen Re-Evaluation):  Oxygen Re-Evaluation - 06/24/21 0816       Program Oxygen Prescription   Program Oxygen Prescription Continuous    Liters per minute 3      Home Oxygen   Home Oxygen Device Portable Concentrator;Home Concentrator;E-Tanks    Sleep Oxygen Prescription CPAP    Liters per minute 3    Home Exercise Oxygen Prescription Continuous    Liters per minute 3    Home Resting Oxygen Prescription Continuous    Liters per minute 2    Compliance with Home Oxygen Use Yes      Goals/Expected Outcomes   Short Term Goals Other;To learn and exhibit compliance with exercise, home and travel O2 prescription    Long  Term Goals Exhibits compliance with exercise, home  and travel O2 prescription    Comments Svetlana uses oxygen at home as directed.  She can make it up the stairs at home on her own.  She can get up the stairs, take a shower and get dressed in 1 1/2 hours where it used to take her 3 hours.    Goals/Expected Outcomes Short: get sleep study Long:  maintain compliance with oxygen             Initial Exercise Prescription:  Initial Exercise Prescription - 03/25/21 1100       Date of Initial Exercise RX and Referring Provider   Date 03/25/21    Referring Provider [name]      Oxygen   Oxygen Continuous    Liters 4      Treadmill   MPH 1.5    Grade 0.5    Minutes 15    METs 2.25      Recumbant Bike   Level 1    RPM 60    Minutes 15    METs 2.25      NuStep   Level 2    SPM 80    Minutes 15    METs 2.25      Arm Ergometer   Level 1    RPM 25    Minutes 15    METs 2.25      Prescription Details   Frequency (times per  week) 3      Intensity   THRR 40-80% of Max Heartrate 125-156    Ratings of Perceived Exertion 11-15    Perceived Dyspnea 0-4      Resistance Training   Training Prescription Yes    Weight 3 lb    Reps 10-15             Perform Capillary Blood Glucose checks as needed.  Exercise Prescription Changes:   Exercise Prescription Changes     Row Name 03/25/21 1100 04/08/21 0800 04/08/21 1100 04/22/21 1300 05/07/21 1400     Response to Exercise  Blood Pressure (Admit) 118/80 -- 140/84 122/80 138/82   Blood Pressure (Exercise) 138/84 -- 122/78 152/82 142/80   Blood Pressure (Exit) 128/82 -- 122/80 120/80 124/70   Heart Rate (Admit) 94 bpm -- 108 bpm 121 bpm 99 bpm   Heart Rate (Exercise) 113 bpm -- 115 bpm 108 bpm 139 bpm   Heart Rate (Exit) 102 bpm -- 115 bpm 117 bpm 125 bpm   Oxygen Saturation (Admit) 98 % -- 95 % 91 % 99 %   Oxygen Saturation (Exercise) 97 % -- 98 % 98 % 97 %   Oxygen Saturation (Exit) 97 % -- 98 % 99 % 98 %   Rating of Perceived Exertion (Exercise) 14 -- 13 -- 13   Perceived Dyspnea (Exercise) 3 -- 1 -- 3   Symptoms SOB -- SOB -- SOB   Duration -- -- Continue with 30 min of aerobic exercise without signs/symptoms of physical distress. Continue with 30 min of aerobic exercise without signs/symptoms of physical distress. Continue with 30 min of aerobic exercise without signs/symptoms of physical distress.   Intensity -- -- THRR unchanged THRR unchanged THRR unchanged     Progression   Progression -- -- Continue to progress workloads to maintain intensity without signs/symptoms of physical distress. Continue to progress workloads to maintain intensity without signs/symptoms of physical distress. Continue to progress workloads to maintain intensity without signs/symptoms of physical distress.   Average METs -- -- 2.5 2.5 2.73     Resistance Training   Training Prescription -- -- Yes Yes Yes   Weight -- -- 3 lb 3 lb 5 lb   Reps -- -- 10-15 10-15 10-15      Interval Training   Interval Training -- -- No No No     Oxygen   Oxygen -- -- Continuous Continuous Continuous   Liters -- -- _0 Recumbant Bike   Level -- -- 2.5 -- 3   Minutes -- -- 15 -- 15   METs -- -- 2.8 -- --     NuStep   Level -- -- _1 Minutes -- -- _2 METs -- -- 2.1 2.5 2.8     Arm Ergometer   Level -- -- 1 -- --   Minutes -- -- 15 -- --     REL-XR   Level -- -- -- -- 1   Minutes -- -- -- -- 15   METs -- -- -- -- 2.8     Track   Laps -- -- -- -- 30   Minutes -- -- -- -- 15   METs -- -- -- -- 2.7     Home Exercise Plan   Plans to continue exercise at -- Home (comment)  walking, staff videos Home (comment)  walking, staff videos Home (comment)  walking, staff videos Home (comment)  walking, staff videos   Frequency -- Add 1 additional day to program exercise sessions. Add 1 additional day to program exercise sessions. Add 1 additional day to program exercise sessions. Add 1 additional day to program exercise sessions.   Initial Home Exercises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 program.  Score declined and patient states that it is because she gets dizzy and tired. 89 mom has been in the hospital since last Thursday.  Her mom is bipolar and has had other issues.  Miyah reports being drained and tired.  She is trying to get a job.  Kazi does take medication as directed.  She has seen a counselor and meets once a month.    Expected Outcomes ST: talk to counselor LT: reduce stressors and create new support system Short: Continue to work on job search Long: Continue to focus on positive. Short: Continue to work toward an improvement in [place] scores by attending [facility] regularly. Long: Continue to improve stress and depression coping skills by talking with staff and attending [facility] regularly and work toward a positive mental state. Short: continue to take meds as directed  Long:  continue to work on stress by exercising and working towards positive mental outlook    Interventions Encouraged to attend [facility] for the exercise -- Encouraged to attend [facility] for the exercise --    Continue Psychosocial Services  Follow up required by staff -- Follow up required by staff --         Initial Review        Source of Stress Concerns Family;Financial -- -- --            Psychosocial Discharge (Final Psychosocial Re-Evaluation):  Psychosocial Re-Evaluation - 06/24/21 0807       Psychosocial Re-Evaluation   Current issues with Current Depression;History of Depression;Current Psychotropic Meds;Current Stress Concerns    Comments Angy's mom has been in the hospital since last Thursday.  Her mom is bipolar and has had other issues.  Priya reports being drained and tired.  She is trying to get a job.  Lorae does take medication as  directed.  She has seen a  counselor and meets once a month.    Expected Outcomes Short: continue to take meds as directed  Long:  continue to work on stress by exercising and working towards positive mental outlook             Education: Education Goals: Education classes will be provided on a weekly basis, covering required topics. Participant will state understanding/return demonstration of topics presented.  Learning Barriers/Preferences:  Learning Barriers/Preferences - 03/20/21 1559       Learning Barriers/Preferences   Learning Barriers None    Learning Preferences None             General Pulmonary Education Topics:  Infection Prevention: - Provides verbal and written material to individual with discussion of infection control including proper hand washing and proper equipment cleaning during exercise session. Flowsheet Row Pulmonary Rehab from 05/15/2021 in [facility] Cardiac and Pulmonary Rehab  Date 03/25/21  Educator AS  Instruction Review Code 1- Verbalizes Understanding       Falls Prevention: - Provides verbal and written material to individual with discussion of falls prevention and safety. Flowsheet Row Pulmonary Rehab from 05/15/2021 in [facility] Cardiac and Pulmonary Rehab  Date 03/25/21  Educator AS  Instruction Review Code 1- Verbalizes Understanding       Chronic Lung Disease Review: - Group verbal instruction with posters, models, PowerPoint presentations and videos,  to review new updates, new respiratory medications, new advancements in procedures and treatments. Providing information on websites and "800" numbers for continued self-education. Includes information about supplement oxygen, available portable oxygen systems, continuous and intermittent flow rates, oxygen safety, concentrators, and Medicare reimbursement for oxygen. Explanation of Pulmonary Drugs, including class, frequency, complications, importance of spacers, rinsing mouth after steroid MDI's, and proper cleaning  methods for nebulizers. Review of basic lung anatomy and physiology related to function, structure, and complications of lung disease. Review of risk factors. Discussion about methods for diagnosing sleep apnea and types of masks and machines for OSA. Includes a review of the use of types of environmental controls: home humidity, furnaces, filters, dust mite/pet prevention, HEPA vacuums. Discussion about weather changes, air quality and the benefits of nasal washing. Instruction on Warning signs, infection symptoms, calling MD promptly, preventive modes, and value of vaccinations. Review of effective airway clearance, coughing and/or vibration techniques. Emphasizing that all should Create an Action Plan. Written material given at graduation.   AED/CPR: - Group verbal and written instruction with the use of models to demonstrate the basic use of the AED with the basic ABC's of resuscitation.    Anatomy and Cardiac Procedures: - Group verbal and visual presentation and models provide information about basic cardiac anatomy and function. Reviews the testing methods done to diagnose heart disease and the outcomes of the test results. Describes the treatment choices: Medical Management, Angioplasty, or Coronary Bypass Surgery for treating various heart conditions including Myocardial Infarction, Angina, Valve Disease, and Cardiac Arrhythmias.  Written material given at graduation.   Medication Safety: - Group verbal and visual instruction to review commonly prescribed medications for heart and lung disease. Reviews the medication, class of the drug, and side effects. Includes the steps to properly store meds and maintain the prescription regimen.  Written material given at graduation. Flowsheet Row Pulmonary Rehab from 05/15/2021 in [facility] Cardiac and Pulmonary Rehab  Date 05/15/21  Educator SB  Instruction Review Code 1- Verbalizes Understanding       Other: -Provides group and verbal instruction on  various  topics (see comments)   Knowledge Questionnaire Score:  Knowledge Questionnaire Score - 03/25/21 1143       Knowledge Questionnaire Score   Pre Score 15/18              Core Components/Risk Factors/Patient Goals at Admission:  Personal Goals and Risk Factors at Admission - 03/25/21 1208       Core Components/Risk Factors/Patient Goals on Admission    Weight Management Yes;Weight Loss    Intervention Weight Management: Develop a combined nutrition and exercise program designed to reach desired caloric intake, while maintaining appropriate intake of nutrient and fiber, sodium and fats, and appropriate energy expenditure required for the weight goal.;Weight Management: Provide education and appropriate resources to help participant work on and attain dietary goals.;Weight Management/Obesity: Establish reasonable short term and long term weight goals.;Obesity: Provide education and appropriate resources to help participant work on and attain dietary goals.    Expected Outcomes Short Term: Continue to assess and modify interventions until short term weight is achieved;Long Term: Adherence to nutrition and physical activity/exercise program aimed toward attainment of established weight goal;Understanding recommendations for meals to include 15-35% energy as protein, 25-35% energy from fat, 35-60% energy from carbohydrates, less than 244m of dietary cholesterol, 20-35 gm of total fiber daily;Weight Loss: Understanding of general recommendations for a balanced deficit meal plan, which promotes 1-2 lb weight loss per week and includes a negative energy balance of [phone] kcal/d;Understanding of distribution of calorie intake throughout the day with the consumption of 4-5 meals/snacks    Improve shortness of breath with ADL's Yes    Intervention Provide education, individualized exercise plan and daily activity instruction to help decrease symptoms of SOB with activities of daily living.     Expected Outcomes Short Term: Improve cardiorespiratory fitness to achieve a reduction of symptoms when performing ADLs;Long Term: Be able to perform more ADLs without symptoms or delay the onset of symptoms    Diabetes Yes    Intervention Provide education about signs/symptoms and action to take for hypo/hyperglycemia.;Provide education about proper nutrition, including hydration, and aerobic/resistive exercise prescription along with prescribed medications to achieve blood glucose in normal ranges: Fasting glucose 65-99 mg/dL    Expected Outcomes Short Term: Participant verbalizes understanding of the signs/symptoms and immediate care of hyper/hypoglycemia, proper foot care and importance of medication, aerobic/resistive exercise and nutrition plan for blood glucose control.;Long Term: Attainment of HbA1C < 7%.    Hypertension Yes    Intervention Provide education on lifestyle modifcations including regular physical activity/exercise, weight management, moderate sodium restriction and increased consumption of fresh fruit, vegetables, and low fat dairy, alcohol moderation, and smoking cessation.;Monitor prescription use compliance.    Expected Outcomes Short Term: Continued assessment and intervention until BP is < 140/983mHG in hypertensive participants. < 130/8060mG in hypertensive participants with diabetes, heart failure or chronic kidney disease.;Long Term: Maintenance of blood pressure at goal levels.    Lipids Yes    Intervention Provide education and support for participant on nutrition & aerobic/resistive exercise along with prescribed medications to achieve LDL <36m72mDL >40mg80m Expected Outcomes Short Term: Participant states understanding of desired cholesterol values and is compliant with medications prescribed. Participant is following exercise prescription and nutrition guidelines.;Long Term: Cholesterol controlled with medications as prescribed, with individualized exercise RX and with  personalized nutrition plan. Value goals: LDL < 36mg,32m > 40 mg.             Education:Diabetes - Individual verbal and written  instruction to review signs/symptoms of diabetes, desired ranges of glucose level fasting, after meals and with exercise. Acknowledge that pre and post exercise glucose checks will be done for 3 sessions at entry of program. Flowsheet Row Pulmonary Rehab from 03/20/2021 in [facility] Cardiac and Pulmonary Rehab  Date 03/20/21  Educator [facility]  Instruction Review Code 1- Verbalizes Understanding       Know Your Numbers and Heart Failure: - Group verbal and visual instruction to discuss disease risk factors for cardiac and pulmonary disease and treatment options.  Reviews associated critical values for Overweight/Obesity, Hypertension, Cholesterol, and Diabetes.  Discusses basics of heart failure: signs/symptoms and treatments.  Introduces Heart Failure Zone chart for action plan for heart failure.  Written material given at graduation.   Core Components/Risk Factors/Patient Goals Review:   Goals and Risk Factor Review     Row Name 04/26/21 7371 06/05/21 0743 06/24/21 0813         Core Components/Risk Factors/Patient Goals Review   Personal Goals Review Weight Management/Obesity;Hypertension;Diabetes;Improve shortness of breath with ADL's Improve shortness of breath with ADL's Improve shortness of breath with ADL's     Review Rheda is doing well in rehab.  She was bad with her diet last week, but now trying to get back to it again.  She is getting better with her breathing.  She continues to work on blood pressures and sugar control.  Her weight was up after her trip but she wants to get back down again. She is doing more at home and sats are staying up even on room air.  We talked about pushing stamina more first versus without oxygen . Spoke to patient about their shortness of breath and what they can do to improve. Patient has been informed of breathing techniques  when starting the program. Patient is informed to tell staff if they have had any med changes and that certain meds they are taking or not taking can be causing shortness of breath. Winfred can tell her SOB has improved with things like going to bathroom and walking a distance.  She can walk all the way down to [place] now without stopping to rest.     Expected Outcomes Short: Continue to work on breathing and weight loss lOng: COntinue to manage diabetes. Short: Attend [facility] regularly to improve shortness of breath with ADL's. Long: maintain independence with ADL's Short :continue to exercise Long:  maintain exercise on her own              Core Components/Risk Factors/Patient Goals at Discharge (Final Review):   Goals and Risk Factor Review - 06/24/21 0813       Core Components/Risk Factors/Patient Goals Review   Personal Goals Review Improve shortness of breath with ADL's    Review Allure can tell her SOB has improved with things like going to bathroom and walking a distance.  She can walk all the way down to [place] now without stopping to rest.    Expected Outcomes Short :continue to exercise Long:  maintain exercise on her own             ITP Comments:  ITP Comments     Row Name 03/20/21 1556 03/25/21 1212 03/27/21 0810 04/01/21 1023 04/17/21 0634   ITP Comments Virtual Visit completed. Patient informed on EP and RD appointment and 6 Minute walk test. Patient also informed of patient health questionnaires on My Chart. Patient Verbalizes understanding. Visit diagnosis can be found in [facility] 03/06/2021. Completed 6MWT and gym  orientation. Initial ITP created and sent for review to Dr. Emily Filbert, Medical Director. First full day of exercise!  Patient was oriented to gym and equipment including functions, settings, policies, and procedures.  Patient's individual exercise prescription and treatment plan were reviewed.  All starting workloads were established based on the results of the 6 minute  walk test done at initial orientation visit.  The plan for exercise progression was also introduced and progression will be customized based on patient's performance and goals. completed initial RD evaluation 30 Day review completed. Medical Director ITP review done, changes made as directed, and signed approval by Medical Director.    [place] Name 05/15/21 0801 06/12/21 0910 07/10/21 1143 07/10/21 1150     ITP Comments 30 Day review completed. Medical Director ITP review done, changes made as directed, and signed approval by Medical Director. 30 Day review completed. Medical Director ITP review done, changes made as directed, and signed approval by Medical Director. Gay's physician has released her to CR with no restrictions.   Gay stated that she was told it may be scar tissue causing the pain. 30 Day review completed. Medical Director ITP review done, changes made as directed, and signed approval by Medical Director.     previous note is an error   wrong chart             Comments:

## 2021-07-17 DIAGNOSIS — R519 Headache, unspecified: Secondary | ICD-10-CM | POA: Diagnosis not present

## 2021-07-17 DIAGNOSIS — H4710 Unspecified papilledema: Secondary | ICD-10-CM | POA: Diagnosis not present

## 2021-07-17 DIAGNOSIS — R079 Chest pain, unspecified: Secondary | ICD-10-CM | POA: Diagnosis not present

## 2021-07-17 DIAGNOSIS — Z6831 Body mass index (BMI) 31.0-31.9, adult: Secondary | ICD-10-CM | POA: Diagnosis not present

## 2021-07-17 DIAGNOSIS — R0602 Shortness of breath: Secondary | ICD-10-CM | POA: Diagnosis not present

## 2021-07-17 DIAGNOSIS — M545 Low back pain, unspecified: Secondary | ICD-10-CM | POA: Diagnosis not present

## 2021-07-17 DIAGNOSIS — R0789 Other chest pain: Secondary | ICD-10-CM | POA: Diagnosis not present

## 2021-07-18 DIAGNOSIS — R519 Headache, unspecified: Secondary | ICD-10-CM | POA: Diagnosis not present

## 2021-07-18 DIAGNOSIS — H4710 Unspecified papilledema: Secondary | ICD-10-CM | POA: Diagnosis not present

## 2021-08-06 DIAGNOSIS — R0602 Shortness of breath: Secondary | ICD-10-CM | POA: Diagnosis not present

## 2021-08-07 DIAGNOSIS — F3162 Bipolar disorder, current episode mixed, moderate: Secondary | ICD-10-CM | POA: Diagnosis not present

## 2021-08-16 DIAGNOSIS — G4733 Obstructive sleep apnea (adult) (pediatric): Secondary | ICD-10-CM | POA: Diagnosis not present

## 2021-09-05 MED ORDER — ADVAIR HFA 115-21 MCG/ACT IN AERO: 2.0000 | INHALATION_SPRAY | Freq: Two times a day (BID) | RESPIRATORY_TRACT | 3 refills | Status: DC

## 2021-09-12 NOTE — Telephone Encounter (Signed)
Created in error

## 2021-09-18 DIAGNOSIS — U099 Post covid-19 condition, unspecified: Secondary | ICD-10-CM | POA: Diagnosis not present

## 2021-09-18 DIAGNOSIS — R0602 Shortness of breath: Secondary | ICD-10-CM | POA: Diagnosis not present

## 2021-10-16 DIAGNOSIS — R0602 Shortness of breath: Secondary | ICD-10-CM | POA: Diagnosis not present

## 2021-10-16 DIAGNOSIS — Z4689 Encounter for fitting and adjustment of other specified devices: Secondary | ICD-10-CM | POA: Diagnosis not present

## 2021-11-04 DIAGNOSIS — I471 Supraventricular tachycardia: Secondary | ICD-10-CM | POA: Diagnosis not present

## 2021-11-04 DIAGNOSIS — I10 Essential (primary) hypertension: Secondary | ICD-10-CM | POA: Diagnosis not present

## 2021-11-04 DIAGNOSIS — E119 Type 2 diabetes mellitus without complications: Secondary | ICD-10-CM | POA: Diagnosis not present

## 2021-11-04 DIAGNOSIS — G4730 Sleep apnea, unspecified: Secondary | ICD-10-CM | POA: Diagnosis not present

## 2021-11-04 DIAGNOSIS — E785 Hyperlipidemia, unspecified: Secondary | ICD-10-CM | POA: Diagnosis not present

## 2021-11-04 DIAGNOSIS — R079 Chest pain, unspecified: Secondary | ICD-10-CM | POA: Diagnosis not present

## 2021-11-04 DIAGNOSIS — E669 Obesity, unspecified: Secondary | ICD-10-CM | POA: Diagnosis not present

## 2021-11-12 DIAGNOSIS — E1165 Type 2 diabetes mellitus with hyperglycemia: Secondary | ICD-10-CM | POA: Diagnosis not present

## 2021-11-12 DIAGNOSIS — E669 Obesity, unspecified: Secondary | ICD-10-CM | POA: Diagnosis not present

## 2021-11-12 DIAGNOSIS — E785 Hyperlipidemia, unspecified: Secondary | ICD-10-CM | POA: Diagnosis not present

## 2021-11-12 DIAGNOSIS — I10 Essential (primary) hypertension: Secondary | ICD-10-CM | POA: Diagnosis not present

## 2021-11-13 DIAGNOSIS — R079 Chest pain, unspecified: Secondary | ICD-10-CM | POA: Diagnosis not present

## 2021-11-13 NOTE — Telephone Encounter (Signed)
Created in error

## 2021-11-14 DIAGNOSIS — E785 Hyperlipidemia, unspecified: Secondary | ICD-10-CM | POA: Diagnosis not present

## 2021-11-14 DIAGNOSIS — D708 Other neutropenia: Secondary | ICD-10-CM | POA: Diagnosis not present

## 2021-11-14 DIAGNOSIS — E782 Mixed hyperlipidemia: Secondary | ICD-10-CM | POA: Diagnosis not present

## 2021-11-14 DIAGNOSIS — E1165 Type 2 diabetes mellitus with hyperglycemia: Secondary | ICD-10-CM | POA: Diagnosis not present

## 2021-11-14 DIAGNOSIS — I340 Nonrheumatic mitral (valve) insufficiency: Secondary | ICD-10-CM | POA: Diagnosis not present

## 2021-11-18 DIAGNOSIS — R0602 Shortness of breath: Secondary | ICD-10-CM | POA: Diagnosis not present

## 2021-11-18 DIAGNOSIS — I471 Supraventricular tachycardia: Secondary | ICD-10-CM | POA: Diagnosis not present

## 2021-11-18 DIAGNOSIS — E785 Hyperlipidemia, unspecified: Secondary | ICD-10-CM | POA: Diagnosis not present

## 2021-11-18 DIAGNOSIS — R079 Chest pain, unspecified: Secondary | ICD-10-CM | POA: Diagnosis not present

## 2021-11-18 DIAGNOSIS — I10 Essential (primary) hypertension: Secondary | ICD-10-CM | POA: Diagnosis not present

## 2021-11-18 DIAGNOSIS — G4730 Sleep apnea, unspecified: Secondary | ICD-10-CM | POA: Diagnosis not present

## 2021-11-19 DIAGNOSIS — R002 Palpitations: Secondary | ICD-10-CM | POA: Diagnosis not present

## 2021-11-19 DIAGNOSIS — I10 Essential (primary) hypertension: Secondary | ICD-10-CM | POA: Diagnosis not present

## 2021-11-19 DIAGNOSIS — R5383 Other fatigue: Secondary | ICD-10-CM | POA: Diagnosis not present

## 2021-11-19 DIAGNOSIS — E1165 Type 2 diabetes mellitus with hyperglycemia: Secondary | ICD-10-CM | POA: Diagnosis not present

## 2021-11-19 DIAGNOSIS — E785 Hyperlipidemia, unspecified: Secondary | ICD-10-CM | POA: Diagnosis not present

## 2021-11-25 DIAGNOSIS — G4733 Obstructive sleep apnea (adult) (pediatric): Secondary | ICD-10-CM | POA: Diagnosis not present

## 2021-12-10 DIAGNOSIS — B948 Sequelae of other specified infectious and parasitic diseases: Secondary | ICD-10-CM | POA: Diagnosis not present

## 2021-12-10 DIAGNOSIS — R296 Repeated falls: Secondary | ICD-10-CM | POA: Diagnosis not present

## 2021-12-10 DIAGNOSIS — Z8701 Personal history of pneumonia (recurrent): Secondary | ICD-10-CM | POA: Diagnosis not present

## 2021-12-10 DIAGNOSIS — Z86711 Personal history of pulmonary embolism: Secondary | ICD-10-CM | POA: Diagnosis not present

## 2021-12-10 DIAGNOSIS — R2681 Unsteadiness on feet: Secondary | ICD-10-CM | POA: Diagnosis not present

## 2021-12-10 DIAGNOSIS — U099 Post covid-19 condition, unspecified: Secondary | ICD-10-CM | POA: Diagnosis not present

## 2021-12-10 DIAGNOSIS — R4189 Other symptoms and signs involving cognitive functions and awareness: Secondary | ICD-10-CM | POA: Diagnosis not present

## 2021-12-10 DIAGNOSIS — R5382 Chronic fatigue, unspecified: Secondary | ICD-10-CM | POA: Diagnosis not present

## 2021-12-10 DIAGNOSIS — R0602 Shortness of breath: Secondary | ICD-10-CM | POA: Diagnosis not present

## 2021-12-10 DIAGNOSIS — R5381 Other malaise: Secondary | ICD-10-CM | POA: Diagnosis not present

## 2021-12-12 DIAGNOSIS — R0602 Shortness of breath: Secondary | ICD-10-CM | POA: Diagnosis not present

## 2021-12-18 DIAGNOSIS — Z23 Encounter for immunization: Secondary | ICD-10-CM | POA: Diagnosis not present

## 2021-12-18 DIAGNOSIS — Z7901 Long term (current) use of anticoagulants: Secondary | ICD-10-CM | POA: Diagnosis not present

## 2021-12-18 DIAGNOSIS — Z6833 Body mass index (BMI) 33.0-33.9, adult: Secondary | ICD-10-CM | POA: Diagnosis not present

## 2021-12-18 DIAGNOSIS — U099 Post covid-19 condition, unspecified: Secondary | ICD-10-CM | POA: Diagnosis not present

## 2021-12-18 DIAGNOSIS — J984 Other disorders of lung: Secondary | ICD-10-CM | POA: Diagnosis not present

## 2021-12-18 DIAGNOSIS — Z794 Long term (current) use of insulin: Secondary | ICD-10-CM | POA: Diagnosis not present

## 2021-12-18 DIAGNOSIS — G4733 Obstructive sleep apnea (adult) (pediatric): Secondary | ICD-10-CM | POA: Diagnosis not present

## 2021-12-18 DIAGNOSIS — F17210 Nicotine dependence, cigarettes, uncomplicated: Secondary | ICD-10-CM | POA: Diagnosis not present

## 2021-12-18 DIAGNOSIS — Z86711 Personal history of pulmonary embolism: Secondary | ICD-10-CM | POA: Diagnosis not present

## 2021-12-18 DIAGNOSIS — Z90710 Acquired absence of both cervix and uterus: Secondary | ICD-10-CM | POA: Diagnosis not present

## 2021-12-20 DIAGNOSIS — I10 Essential (primary) hypertension: Secondary | ICD-10-CM | POA: Diagnosis not present

## 2021-12-20 DIAGNOSIS — E669 Obesity, unspecified: Secondary | ICD-10-CM | POA: Diagnosis not present

## 2021-12-20 DIAGNOSIS — R0602 Shortness of breath: Secondary | ICD-10-CM | POA: Diagnosis not present

## 2021-12-20 DIAGNOSIS — G4739 Other sleep apnea: Secondary | ICD-10-CM | POA: Diagnosis not present

## 2021-12-20 DIAGNOSIS — I471 Supraventricular tachycardia: Secondary | ICD-10-CM | POA: Diagnosis not present

## 2021-12-20 DIAGNOSIS — E785 Hyperlipidemia, unspecified: Secondary | ICD-10-CM | POA: Diagnosis not present

## 2021-12-20 DIAGNOSIS — R079 Chest pain, unspecified: Secondary | ICD-10-CM | POA: Diagnosis not present

## 2021-12-20 DIAGNOSIS — R55 Syncope and collapse: Secondary | ICD-10-CM | POA: Diagnosis not present

## 2021-12-30 ENCOUNTER — Other Ambulatory Visit: Payer: Self-pay | Admitting: Primary Care

## 2021-12-31 DIAGNOSIS — R918 Other nonspecific abnormal finding of lung field: Secondary | ICD-10-CM | POA: Diagnosis not present

## 2021-12-31 DIAGNOSIS — U099 Post covid-19 condition, unspecified: Secondary | ICD-10-CM | POA: Diagnosis not present

## 2021-12-31 DIAGNOSIS — J984 Other disorders of lung: Secondary | ICD-10-CM | POA: Diagnosis not present

## 2021-12-31 DIAGNOSIS — E010 Iodine-deficiency related diffuse (endemic) goiter: Secondary | ICD-10-CM | POA: Diagnosis not present

## 2021-12-31 DIAGNOSIS — R0602 Shortness of breath: Secondary | ICD-10-CM | POA: Diagnosis not present

## 2022-01-13 ENCOUNTER — Other Ambulatory Visit: Payer: Self-pay

## 2022-01-13 ENCOUNTER — Ambulatory Visit (INDEPENDENT_AMBULATORY_CARE_PROVIDER_SITE_OTHER): Payer: Medicaid Other | Admitting: Plastic Surgery

## 2022-01-13 VITALS — BP 122/78 | HR 91 | Ht 69.0 in | Wt 226.8 lb

## 2022-01-13 DIAGNOSIS — M793 Panniculitis, unspecified: Secondary | ICD-10-CM | POA: Diagnosis not present

## 2022-01-15 DIAGNOSIS — L700 Acne vulgaris: Secondary | ICD-10-CM | POA: Diagnosis not present

## 2022-01-15 DIAGNOSIS — D2230 Melanocytic nevi of unspecified part of face: Secondary | ICD-10-CM | POA: Diagnosis not present

## 2022-01-16 ENCOUNTER — Encounter: Payer: Self-pay | Admitting: Plastic Surgery

## 2022-01-16 NOTE — Progress Notes (Signed)
Referring Provider [address], [address] [place],  [place] [address]   CC: Abdominal pannus and rashes.    Crystal Gibson is an 51 y.o. female.  HPI: Patient is a 51 year old with a history of abdominal pannus and rashes as well as pain in her suprapubic area.  She also has back pain.  She has used Diflucan and over-the-counter creams for her lower abdominal rashes.  She has prior abdominal surgeries including C-section in 2016 hysterectomy in 2008.  She is a non-smoker.  She does have diabetes.  She notes 20 pound weight loss.  Allergies  Allergen Reactions   Glucophage [Metformin] Nausea And Vomiting    Outpatient Encounter Medications as of 01/13/2022  Medication Sig   ACCU-CHEK FASTCLIX LANCETS MISC    albuterol (PROVENTIL HFA;VENTOLIN HFA) 108 (90 BASE) MCG/ACT inhaler Inhale 2 puffs into the lungs every 4 (four) hours as needed for wheezing or shortness of breath.   atorvastatin (LIPITOR) 20 MG tablet    BD PEN NEEDLE NANO 2ND GEN 32G X 4 MM MISC USE THREE TIMES DAILY BEFORE MEALS   Blood Glucose Monitoring Suppl (ACCU-CHEK GUIDE) w/Device KIT USE DEVICE TO CHECK SUGARS DAILY   cyclobenzaprine (FLEXERIL) 5 MG tablet Take 1-2 tablets 3 times daily as needed   DULoxetine (CYMBALTA) 60 MG capsule TK ONE C PO QD   etodolac (LODINE) 400 MG tablet    famotidine (PEPCID) 20 MG tablet Take 20 mg by mouth daily.   FARXIGA 10 MG TABS tablet Take 10 mg by mouth daily.   Fluocinolone Acetonide Body 0.01 % OIL Derma-Smoothe/FS Scalp 0.01% External Oil QTY: 1 mL Days: 30 Refills: 5  Written: 01/16/21 Patient Instructions: Apply thin film to affected areas on scalp every 8 hours   Fluocinolone Acetonide Scalp 0.01 % OIL SMARTSIG:Sparingly Topical Every 8 Hours   fluticasone (FLONASE) 50 MCG/ACT nasal spray Place 1 spray into both nostrils 2 (two) times daily.   fluticasone (FLOVENT HFA) 110 MCG/ACT inhaler Inhale into the lungs.   gabapentin (NEURONTIN) 300 MG capsule Take 1  capsule (300 mg total) by mouth 3 (three) times daily.   glucose blood (ACCU-CHEK GUIDE) test strip Accu-Chek Guide In Vitro Strip QTY: 200 strip Days: 90 Refills: 1  Written: 04/27/20 Patient Instructions: TEST FASTING BLOOD SUGAR EVERY MORNING FASTING AND EVERY EVENING E11.65   glucose blood ([employer] BLOOD GLUCOSE TEST) test strip Use to check blood sugar as directed with insulin 3 times a day & for symptoms of high or low blood sugar.   HUMALOG KWIKPEN 100 UNIT/ML KwikPen 5 Units 3 (three) times daily.   ibuprofen (ADVIL,MOTRIN) 600 MG tablet Take 1 tablet (600 mg total) by mouth every 6 (six) hours as needed.   icosapent Ethyl (VASCEPA) 1 g capsule Vascepa 1 GM Oral Capsule QTY: 120 capsule Days: 30 Refills: 0  Written: 11/09/20 Patient Instructions: Take 2 capsules by mouth daily in AM and 2 capsules in evening, need labs and appt   insulin degludec (TRESIBA FLEXTOUCH) 100 UNIT/ML FlexTouch Pen INJECT 30 UNITS UNDER THE SKIN EVERY DAY   Insulin Pen Needle (GLOBAL EASY GLIDE PEN NEEDLES) 32G X 4 MM MISC Use for injections three (3) times a day before meals.   ISOtretinoin (ACCUTANE) 30 MG capsule    ketoconazole (NIZORAL) 2 % shampoo ketoconazole 2 % shampoo  APPLY TO SCALP ONCE WEEKLY AND LET SIT SEVERAL MINUTES BEFORE RINSING   Lancets Misc. (ACCU-CHEK FASTCLIX LANCET) KIT Accu-Chek Fastclix Lancet Drum  USE TO  CHECK FASTING IN AM AND AS NEEDED FOR HIGH OR LOW   levofloxacin (LEVAQUIN) 500 MG tablet    meloxicam (MOBIC) 15 MG tablet Take 1 tablet (15 mg total) by mouth daily.   metoprolol succinate (TOPROL-XL) 50 MG 24 hr tablet Take by mouth.   metoprolol tartrate (LOPRESSOR) 50 MG tablet Take 1 tablet (50 mg total) by mouth daily.   NEXLIZET 180-10 MG TABS Take 1 tablet by mouth daily.   olmesartan (BENICAR) 5 MG tablet    omeprazole (PRILOSEC) 20 MG capsule omeprazole 20 mg capsule,delayed release  TAKE 1 CAPSULE BY MOUTH EVERY DAY BEFORE MEAL   ONETOUCH VERIO test strip 1 each 4 (four)  times daily.   OXYGEN 3 L.   phenazopyridine (PYRIDIUM) 95 MG tablet Take 1 tablet (95 mg total) by mouth 3 (three) times daily as needed for pain.   rosuvastatin (CRESTOR) 40 MG tablet Take by mouth.   traZODone (DESYREL) 50 MG tablet Take 200 mg by mouth at bedtime.    Vitamin D, Ergocalciferol, (DRISDOL) 1.25 MG (50000 UNIT) CAPS capsule Vitamin D (Ergocalciferol) 1.25 MG (50000 UT) Oral Capsule QTY: 6 capsule Days: 42 Refills: 0  Written: 09/29/19 Patient Instructions: Take 1 tablet once weekly for 6 weeks and recheck   zolpidem (AMBIEN CR) 12.5 MG CR tablet zolpidem ER 12.5 mg tablet,extended release,multiphase  TK 1 T PO ONCE D HS PRN FOR INSOMNIA   [DISCONTINUED] acetaminophen (TYLENOL) 500 MG tablet Take by mouth.   [DISCONTINUED] apixaban (ELIQUIS) 5 MG TABS tablet Take by mouth.   [DISCONTINUED] butalbital-acetaminophen-caffeine (FIORICET) 50-325-40 MG tablet Take by mouth.   [DISCONTINUED] butalbital-acetaminophen-caffeine (FIORICET) 50-325-40 MG tablet Take 1 tablet by mouth every 4 (four) hours as needed. (Patient not taking: Reported on 03/20/2021)   [DISCONTINUED] cetirizine (ZYRTEC) 10 MG tablet Take 10 mg by mouth daily. (Patient not taking: Reported on 03/20/2021)   [DISCONTINUED] Cetirizine HCl (ZYRTEC ALLERGY) 10 MG CAPS Take by mouth.   [DISCONTINUED] chlorthalidone (HYGROTON) 25 MG tablet Take 25 mg by mouth daily.   [DISCONTINUED] famciclovir (FAMVIR) 500 MG tablet famciclovir 500 mg tablet  TK 1 T PO BID (Patient not taking: Reported on 03/20/2021)   [DISCONTINUED] fluconazole (DIFLUCAN) 150 MG tablet fluconazole 150 mg tablet   [DISCONTINUED] fluticasone-salmeterol (ADVAIR HFA) 115-21 MCG/ACT inhaler Inhale 2 puffs into the lungs 2 (two) times daily.   [DISCONTINUED] fluticasone-salmeterol (ADVAIR HFA) 115-21 MCG/ACT inhaler INHALE 2 PUFFS INTO THE LUNGS TWICE DAILY   [DISCONTINUED] glipiZIDE (GLUCOTROL XL) 5 MG 24 hr tablet  (Patient not taking: No sig reported)    [DISCONTINUED] glucose blood test strip Accu-Chek Active In Vitro Strip QTY: 300 strip Days: 90 Refills: 3  Written: 04/06/19 Patient Instructions: Use with glucometer to check blood sugars daily in AM fasting and prn for low/high blood sugars - E11.9   [DISCONTINUED] glucose blood test strip OneTouch Verio test strips   [DISCONTINUED] insulin lispro (HUMALOG) 100 UNIT/ML KwikPen Inject into the skin.   [DISCONTINUED] Insulin NPH, Human,, Isophane, (HUMULIN N KWIKPEN) 100 UNIT/ML Kiwkpen Inject 20 Units into the skin in the morning and at bedtime.   [DISCONTINUED] lidocaine (XYLOCAINE) 2 % solution Lidocaine Viscous 2 % mucosal solution  TAKE 5 MILLILITERS BY MOUTH 3 TIMES A DAY BEFORE MEALS WILL 10 MILLILITERS OF PETRIACTIN (Patient not taking: Reported on 03/20/2021)   [DISCONTINUED] meperidine (DEMEROL) 50 MG tablet meperidine 50 mg tablet  TK 1 OR 2 TS PO Q 4 TO 6 H PRF PAIN (Patient not taking: No sig reported)   [  DISCONTINUED] mupirocin cream (BACTROBAN) 2 % Apply 1 application topically 2 (two) times daily.   [DISCONTINUED] mupirocin ointment (BACTROBAN) 2 % Apply 1 application topically 2 (two) times daily.   [DISCONTINUED] Na Sulfate-K Sulfate-Mg Sulf 17.5-3.13-1.6 GM/177ML SOLN Suprep Bowel Prep Kit 17.5 gram-3.13 gram-1.6 gram oral solution  U UTD (Patient not taking: Reported on 03/20/2021)   No facility-administered encounter medications on file as of 01/13/2022.     Past Medical History:  Diagnosis Date   Asthma    Environmental allergies    Gestational diabetes     Past Surgical History:  Procedure Laterality Date   ABDOMINAL HYSTERECTOMY     OOPHORECTOMY      Family History  Problem Relation Age of Onset   Breast cancer Sister    Breast cancer Maternal Aunt    Breast cancer Paternal Aunt    Breast cancer Maternal Grandmother    Breast cancer Cousin     Social History   Social History Narrative   Not on file     Review of Systems General: Denies fevers,  chills, weight loss CV: Denies chest pain, shortness of breath, palpitations   Physical Exam Vitals with BMI 01/13/2022 04/29/2021 04/29/2021  Height 5' 9"  - -  Weight 226 lbs 13 oz - -  BMI 82.42 - -  Systolic 353 614 431  Diastolic 78 86 73  Pulse 91 99 96    General:  No acute distress,  Alert and oriented, Non-Toxic, Normal speech and affect Abdomen: Some intra-abdominal fullness.  Pannus hangs below the pubic bone.  Hyperpigmentation from abdominal rashes noted.  Significant abdominal pannus.  Assessment/Plan The patient is a good candidate for panniculectomy.  There is a possibility that this may improve her abdominal rashes.  I think that in the future if she could be a candidate for abdominoplasty in the future but I do not think that abdominoplasty is a good idea at her current weight since she has significant upper abdominal fullness due to intra-abdominal adipose.  Lennice Sites 01/16/2022, 8:24 AM

## 2022-01-31 ENCOUNTER — Ambulatory Visit (INDEPENDENT_AMBULATORY_CARE_PROVIDER_SITE_OTHER): Payer: Medicaid Other | Admitting: Plastic Surgery

## 2022-01-31 ENCOUNTER — Other Ambulatory Visit: Payer: Self-pay

## 2022-01-31 VITALS — BP 129/79 | HR 94 | Ht 69.0 in | Wt 225.0 lb

## 2022-01-31 DIAGNOSIS — M793 Panniculitis, unspecified: Secondary | ICD-10-CM

## 2022-02-01 ENCOUNTER — Encounter: Payer: Self-pay | Admitting: Plastic Surgery

## 2022-02-01 NOTE — Progress Notes (Signed)
CC: Abdominal pannus and rashes.       Crystal Gibson is an 51 y.o. female.  HPI: Patient is a 51 year old with a history of abdominal pannus and rashes as well as pain in her suprapubic area.  She is preop for panniculectomy.  She also has back pain.  She has used Diflucan and over-the-counter creams for her lower abdominal rashes.  She has prior abdominal surgeries including C-section in 2016 hysterectomy in 2008.  She is a non-smoker.  She does have diabetes.  She notes 20 pound weight loss.       Allergies  Allergen Reactions   Glucophage [Metformin] Nausea And Vomiting          Outpatient Encounter Medications as of 01/13/2022  Medication Sig   ACCU-CHEK FASTCLIX LANCETS MISC     albuterol (PROVENTIL HFA;VENTOLIN HFA) 108 (90 BASE) MCG/ACT inhaler Inhale 2 puffs into the lungs every 4 (four) hours as needed for wheezing or shortness of breath.   atorvastatin (LIPITOR) 20 MG tablet     BD PEN NEEDLE NANO 2ND GEN 32G X 4 MM MISC USE THREE TIMES DAILY BEFORE MEALS   Blood Glucose Monitoring Suppl (ACCU-CHEK GUIDE) w/Device KIT USE DEVICE TO CHECK SUGARS DAILY   cyclobenzaprine (FLEXERIL) 5 MG tablet Take 1-2 tablets 3 times daily as needed   DULoxetine (CYMBALTA) 60 MG capsule TK ONE C PO QD   etodolac (LODINE) 400 MG tablet     famotidine (PEPCID) 20 MG tablet Take 20 mg by mouth daily.   FARXIGA 10 MG TABS tablet Take 10 mg by mouth daily.   Fluocinolone Acetonide Body 0.01 % OIL Derma-Smoothe/FS Scalp 0.01% External Oil QTY: 1 mL Days: 30 Refills: 5  Written: 01/16/21 Patient Instructions: Apply thin film to affected areas on scalp every 8 hours   Fluocinolone Acetonide Scalp 0.01 % OIL SMARTSIG:Sparingly Topical Every 8 Hours   fluticasone (FLONASE) 50 MCG/ACT nasal spray Place 1 spray into both nostrils 2 (two) times daily.   fluticasone (FLOVENT HFA) 110 MCG/ACT inhaler Inhale into the lungs.   gabapentin (NEURONTIN) 300 MG capsule Take 1 capsule (300 mg total) by mouth 3 (three)  times daily.   glucose blood (ACCU-CHEK GUIDE) test strip Accu-Chek Guide In Vitro Strip QTY: 200 strip Days: 90 Refills: 1  Written: 04/27/20 Patient Instructions: TEST FASTING BLOOD SUGAR EVERY MORNING FASTING AND EVERY EVENING E11.65   glucose blood ([employer] BLOOD GLUCOSE TEST) test strip Use to check blood sugar as directed with insulin 3 times a day & for symptoms of high or low blood sugar.   HUMALOG KWIKPEN 100 UNIT/ML KwikPen 5 Units 3 (three) times daily.   ibuprofen (ADVIL,MOTRIN) 600 MG tablet Take 1 tablet (600 mg total) by mouth every 6 (six) hours as needed.   icosapent Ethyl (VASCEPA) 1 g capsule Vascepa 1 GM Oral Capsule QTY: 120 capsule Days: 30 Refills: 0  Written: 11/09/20 Patient Instructions: Take 2 capsules by mouth daily in AM and 2 capsules in evening, need labs and appt   insulin degludec (TRESIBA FLEXTOUCH) 100 UNIT/ML FlexTouch Pen INJECT 30 UNITS UNDER THE SKIN EVERY DAY   Insulin Pen Needle (GLOBAL EASY GLIDE PEN NEEDLES) 32G X 4 MM MISC Use for injections three (3) times a day before meals.   ISOtretinoin (ACCUTANE) 30 MG capsule     ketoconazole (NIZORAL) 2 % shampoo ketoconazole 2 % shampoo  APPLY TO SCALP ONCE WEEKLY AND LET SIT SEVERAL MINUTES BEFORE RINSING   Lancets Misc. (ACCU-CHEK FASTCLIX LANCET)  KIT Accu-Chek Fastclix Lancet Drum  USE TO CHECK FASTING IN AM AND AS NEEDED FOR HIGH OR LOW   levofloxacin (LEVAQUIN) 500 MG tablet     meloxicam (MOBIC) 15 MG tablet Take 1 tablet (15 mg total) by mouth daily.   metoprolol succinate (TOPROL-XL) 50 MG 24 hr tablet Take by mouth.   metoprolol tartrate (LOPRESSOR) 50 MG tablet Take 1 tablet (50 mg total) by mouth daily.   NEXLIZET 180-10 MG TABS Take 1 tablet by mouth daily.   olmesartan (BENICAR) 5 MG tablet     omeprazole (PRILOSEC) 20 MG capsule omeprazole 20 mg capsule,delayed release  TAKE 1 CAPSULE BY MOUTH EVERY DAY BEFORE MEAL   ONETOUCH VERIO test strip 1 each 4 (four) times daily.   OXYGEN 3 L.    phenazopyridine (PYRIDIUM) 95 MG tablet Take 1 tablet (95 mg total) by mouth 3 (three) times daily as needed for pain.   rosuvastatin (CRESTOR) 40 MG tablet Take by mouth.   traZODone (DESYREL) 50 MG tablet Take 200 mg by mouth at bedtime.    Vitamin D, Ergocalciferol, (DRISDOL) 1.25 MG (50000 UNIT) CAPS capsule Vitamin D (Ergocalciferol) 1.25 MG (50000 UT) Oral Capsule QTY: 6 capsule Days: 42 Refills: 0  Written: 09/29/19 Patient Instructions: Take 1 tablet once weekly for 6 weeks and recheck   zolpidem (AMBIEN CR) 12.5 MG CR tablet zolpidem ER 12.5 mg tablet,extended release,multiphase  TK 1 T PO ONCE D HS PRN FOR INSOMNIA   [DISCONTINUED] acetaminophen (TYLENOL) 500 MG tablet Take by mouth.   [DISCONTINUED] apixaban (ELIQUIS) 5 MG TABS tablet Take by mouth.   [DISCONTINUED] butalbital-acetaminophen-caffeine (FIORICET) 50-325-40 MG tablet Take by mouth.   [DISCONTINUED] butalbital-acetaminophen-caffeine (FIORICET) 50-325-40 MG tablet Take 1 tablet by mouth every 4 (four) hours as needed. (Patient not taking: Reported on 03/20/2021)   [DISCONTINUED] cetirizine (ZYRTEC) 10 MG tablet Take 10 mg by mouth daily. (Patient not taking: Reported on 03/20/2021)   [DISCONTINUED] Cetirizine HCl (ZYRTEC ALLERGY) 10 MG CAPS Take by mouth.   [DISCONTINUED] chlorthalidone (HYGROTON) 25 MG tablet Take 25 mg by mouth daily.   [DISCONTINUED] famciclovir (FAMVIR) 500 MG tablet famciclovir 500 mg tablet  TK 1 T PO BID (Patient not taking: Reported on 03/20/2021)   [DISCONTINUED] fluconazole (DIFLUCAN) 150 MG tablet fluconazole 150 mg tablet   [DISCONTINUED] fluticasone-salmeterol (ADVAIR HFA) 115-21 MCG/ACT inhaler Inhale 2 puffs into the lungs 2 (two) times daily.   [DISCONTINUED] fluticasone-salmeterol (ADVAIR HFA) 115-21 MCG/ACT inhaler INHALE 2 PUFFS INTO THE LUNGS TWICE DAILY   [DISCONTINUED] glipiZIDE (GLUCOTROL XL) 5 MG 24 hr tablet  (Patient not taking: No sig reported)   [DISCONTINUED] glucose blood test strip  Accu-Chek Active In Vitro Strip QTY: 300 strip Days: 90 Refills: 3  Written: 04/06/19 Patient Instructions: Use with glucometer to check blood sugars daily in AM fasting and prn for low/high blood sugars - E11.9   [DISCONTINUED] glucose blood test strip OneTouch Verio test strips   [DISCONTINUED] insulin lispro (HUMALOG) 100 UNIT/ML KwikPen Inject into the skin.   [DISCONTINUED] Insulin NPH, Human,, Isophane, (HUMULIN N KWIKPEN) 100 UNIT/ML Kiwkpen Inject 20 Units into the skin in the morning and at bedtime.   [DISCONTINUED] lidocaine (XYLOCAINE) 2 % solution Lidocaine Viscous 2 % mucosal solution  TAKE 5 MILLILITERS BY MOUTH 3 TIMES A DAY BEFORE MEALS WILL 10 MILLILITERS OF PETRIACTIN (Patient not taking: Reported on 03/20/2021)   [DISCONTINUED] meperidine (DEMEROL) 50 MG tablet meperidine 50 mg tablet  TK 1 OR 2 TS PO Q 4 TO  6 H PRF PAIN (Patient not taking: No sig reported)   [DISCONTINUED] mupirocin cream (BACTROBAN) 2 % Apply 1 application topically 2 (two) times daily.   [DISCONTINUED] mupirocin ointment (BACTROBAN) 2 % Apply 1 application topically 2 (two) times daily.   [DISCONTINUED] Na Sulfate-K Sulfate-Mg Sulf 17.5-3.13-1.6 GM/177ML SOLN Suprep Bowel Prep Kit 17.5 gram-3.13 gram-1.6 gram oral solution  U UTD (Patient not taking: Reported on 03/20/2021)    No facility-administered encounter medications on file as of 01/13/2022.          Past Medical History:  Diagnosis Date   Asthma     Environmental allergies     Gestational diabetes             Past Surgical History:  Procedure Laterality Date   ABDOMINAL HYSTERECTOMY       OOPHORECTOMY               Family History  Problem Relation Age of Onset   Breast cancer Sister     Breast cancer Maternal Aunt     Breast cancer Paternal Aunt     Breast cancer Maternal Grandmother     Breast cancer Cousin        Social History       Social History Narrative   Not on file      Review of Systems General: Denies fevers,  chills, weight loss CV: Denies chest pain, shortness of breath, palpitations     Physical Exam General:  No acute distress,  Alert and oriented, Non-Toxic, Normal speech and affect Heent: [place]/AT Chest:  clear, no wheezing CV: RRR Ext:  no clubbing, cyanosis, or edema  Abdomen: Some intra-abdominal fullness.  Pannus hangs below the pubic bone.  Hyperpigmentation from abdominal rashes noted.  Significant abdominal pannus.   Assessment/Plan The patient is a good candidate for panniculectomy.  There is a possibility that this may improve her abdominal rashes.  Risks and benefits were discussed and the patient wishes to proceed.  Cabrini score was calculated at 4.

## 2022-02-10 NOTE — Progress Notes (Addendum)
Surgical Instructions    Your procedure is scheduled on Wednesday, February 22nd.  Report to [facility] Main Entrance "A" at 12:15 P.M., then check in with the Admitting office.  Call this number if you have problems the morning of surgery:  [phone]   If you have any questions prior to your surgery date call [phone]: Open Monday-Friday 8am-4pm    Remember:  Do not eat or drink after midnight the night before your surgery    Take these medicines the morning of surgery with A SIP OF WATER:   Duloxetine (Cymbalta)  Baclofen (Lioresal)  Fluticasone (Flovent) inhaler  Gabapentin (Neurontin)  Icosapent Ethyl (Vascepa)  Metoprolol Tartrate (Lopressor)  Nexlizet  Omeprazole (Prilosec)  Rosuvastatin (Crestor)  Symbicort inhaler   If needed:  Albuterol inhaler (bring with you on day of surgery)   As of today, STOP taking any Aspirin (unless otherwise instructed by your surgeon) Aleve, Naproxen, Ibuprofen, Motrin, Advil, Goody's, BC's, all herbal medications, fish oil, and all vitamins.  WHAT DO I DO ABOUT MY DIABETES MEDICATION?   Do not take oral diabetes medicines (pills) the morning of surgery. Wilder Glade  THE DAY BEFORE SURGERY, do NOT take [place]  THE NIGHT BEFORE SURGERY, do NOT take bedtime dose of Humalog THE NIGHT BEFORE SURGERY, take 50% (17 units) of bedtime dose of [place]     THE MORNING OF SURGERY, do NOT take Humalog THE MORNING OF SURGERY, take 50% (17 units) of bedtime dose of Tresiba  The day of surgery, do not take other diabetes injectables, including Byetta (exenatide), Bydureon (exenatide ER), Victoza (liraglutide), or Trulicity (dulaglutide).  If your CBG is greater than 220 mg/dL, you may take  of your sliding scale (correction) dose of insulin.   HOW TO MANAGE YOUR DIABETES BEFORE AND AFTER SURGERY  Why is it important to control my blood sugar before and after surgery? Improving blood sugar levels before and after surgery helps healing  and can limit problems. A way of improving blood sugar control is eating a healthy diet by:  Eating less sugar and carbohydrates  Increasing activity/exercise  Talking with your doctor about reaching your blood sugar goals High blood sugars (greater than 180 mg/dL) can raise your risk of infections and slow your recovery, so you will need to focus on controlling your diabetes during the weeks before surgery. Make sure that the doctor who takes care of your diabetes knows about your planned surgery including the date and location.  How do I manage my blood sugar before surgery? Check your blood sugar at least 4 times a day, starting 2 days before surgery, to make sure that the level is not too high or low.  Check your blood sugar the morning of your surgery when you wake up and every 2 hours until you get to the Short Stay unit.  If your blood sugar is less than 70 mg/dL, you will need to treat for low blood sugar: Do not take insulin. Treat a low blood sugar (less than 70 mg/dL) with  cup of clear juice (cranberry or apple), 4 glucose tablets, OR glucose gel. Recheck blood sugar in 15 minutes after treatment (to make sure it is greater than 70 mg/dL). If your blood sugar is not greater than 70 mg/dL on recheck, call [phone] for further instructions. Report your blood sugar to the short stay nurse when you get to Short Stay.  If you are admitted to the hospital after surgery: Your blood sugar will be checked by the  staff and you will probably be given insulin after surgery (instead of oral diabetes medicines) to make sure you have good blood sugar levels. The goal for blood sugar control after surgery is 80-180 mg/dL.            DAY OF SURGERY: Do not wear jewelry or makeup Do not wear lotions, powders, perfumes, or deodorant. Do not shave 48 hours prior to surgery.  Do not bring valuables to the hospital. Do not wear nail polish, gel polish, artificial nails, or any other type of  covering on natural nails (fingers and toes) If you have artificial nails or gel coating that need to be removed by a nail salon, please have this removed prior to surgery. Artificial nails or gel coating may interfere with anesthesia's ability to adequately monitor your vital signs.  [facility] is not responsible for any belongings or valuables. .   Do NOT Smoke (Tobacco/Vaping)  24 hours prior to your procedure  If you use a CPAP at night, you may bring your mask for your overnight stay.   Contacts, glasses, hearing aids, dentures or partials may not be worn into surgery, please bring cases for these belongings   For patients admitted to the hospital, discharge time will be determined by your treatment team.   Patients discharged the day of surgery will not be allowed to drive home, and someone needs to stay with them for 24 hours.  NO VISITORS WILL BE ALLOWED IN PRE-OP WHERE PATIENTS ARE PREPPED FOR SURGERY.  ONLY 1 SUPPORT PERSON MAY BE PRESENT IN THE WAITING ROOM WHILE YOU ARE IN SURGERY.  IF YOU ARE TO BE ADMITTED, ONCE YOU ARE IN YOUR ROOM YOU WILL BE ALLOWED TWO (2) VISITORS. 1 (ONE) VISITOR MAY STAY OVERNIGHT BUT MUST ARRIVE TO THE ROOM BY 8pm.  Minor children may have two parents present. Special consideration for safety and communication needs will be reviewed on a case by case basis.  Special instructions:    Oral Hygiene is also important to reduce your risk of infection.  Remember - BRUSH YOUR TEETH THE MORNING OF SURGERY WITH YOUR REGULAR TOOTHPASTE   [facility]- Preparing For Surgery  Before surgery, you can play an important role. Because skin is not sterile, your skin needs to be as free of germs as possible. You can reduce the number of germs on your skin by washing with CHG (chlorahexidine gluconate) Soap before surgery.  CHG is an antiseptic cleaner which kills germs and bonds with the skin to continue killing germs even after washing.     Please do not use if you  have an allergy to CHG or antibacterial soaps. If your skin becomes reddened/irritated stop using the CHG.  Do not shave (including legs and underarms) for at least 48 hours prior to first CHG shower. It is OK to shave your face.  Please follow these instructions carefully.     Shower the NIGHT BEFORE SURGERY and the MORNING OF SURGERY with CHG Soap.   If you chose to wash your hair, wash your hair first as usual with your normal shampoo. After you shampoo, rinse your hair and body thoroughly to remove the shampoo.  Then [employer] and genitals (private parts) with your normal soap and rinse thoroughly to remove soap.  After that Use CHG Soap as you would any other liquid soap. You can apply CHG directly to the skin and wash gently with a scrungie or a clean washcloth.   Apply the CHG  Soap to your body ONLY FROM THE NECK DOWN.  Do not use on open wounds or open sores. Avoid contact with your eyes, ears, mouth and genitals (private parts). Wash Face and genitals (private parts)  with your normal soap.   Wash thoroughly, paying special attention to the area where your surgery will be performed.  Thoroughly rinse your body with warm water from the neck down.  DO NOT shower/wash with your normal soap after using and rinsing off the CHG Soap.  Pat yourself dry with a CLEAN TOWEL.  Wear CLEAN PAJAMAS to bed the night before surgery  Place CLEAN SHEETS on your bed the night before your surgery  DO NOT SLEEP WITH PETS.   Day of Surgery:  Take a shower with CHG soap. Wear Clean/Comfortable clothing the morning of surgery Do not apply any deodorants/lotions.   Remember to brush your teeth WITH YOUR REGULAR TOOTHPASTE.   Please read over the following fact sheets that you were given.

## 2022-02-11 ENCOUNTER — Encounter (HOSPITAL_COMMUNITY): Payer: Self-pay

## 2022-02-11 ENCOUNTER — Encounter (HOSPITAL_COMMUNITY)
Admission: RE | Admit: 2022-02-11 | Discharge: 2022-02-11 | Disposition: A | Discharge status: Home / Self Care | Payer: Medicaid Other | Source: Ambulatory Visit | Attending: Plastic Surgery | Admitting: Plastic Surgery

## 2022-02-11 ENCOUNTER — Other Ambulatory Visit: Payer: Self-pay

## 2022-02-11 VITALS — BP 133/86 | HR 103 | Temp 98.6°F | Resp 18 | Ht 69.0 in | Wt 222.7 lb

## 2022-02-11 DIAGNOSIS — E119 Type 2 diabetes mellitus without complications: Secondary | ICD-10-CM | POA: Diagnosis not present

## 2022-02-11 DIAGNOSIS — R0902 Hypoxemia: Secondary | ICD-10-CM | POA: Diagnosis not present

## 2022-02-11 DIAGNOSIS — J189 Pneumonia, unspecified organism: Secondary | ICD-10-CM | POA: Diagnosis not present

## 2022-02-11 DIAGNOSIS — F32A Depression, unspecified: Secondary | ICD-10-CM | POA: Insufficient documentation

## 2022-02-11 DIAGNOSIS — G4733 Obstructive sleep apnea (adult) (pediatric): Secondary | ICD-10-CM | POA: Diagnosis not present

## 2022-02-11 DIAGNOSIS — Z01818 Encounter for other preprocedural examination: Secondary | ICD-10-CM | POA: Diagnosis not present

## 2022-02-11 DIAGNOSIS — J45909 Unspecified asthma, uncomplicated: Secondary | ICD-10-CM | POA: Insufficient documentation

## 2022-02-11 DIAGNOSIS — Z8616 Personal history of COVID-19: Secondary | ICD-10-CM | POA: Diagnosis not present

## 2022-02-11 HISTORY — DX: Sleep apnea, unspecified: G47.30

## 2022-02-11 HISTORY — DX: Personal history of other venous thrombosis and embolism: Z86.718

## 2022-02-11 HISTORY — DX: Anxiety disorder, unspecified: F41.9

## 2022-02-11 HISTORY — DX: Depression, unspecified: F32.A

## 2022-02-11 LAB — HEMOGLOBIN A1C
Hgb A1c MFr Bld: 10 % — ABNORMAL HIGH (ref 4.8–5.6)
Mean Plasma Glucose: 240.3 mg/dL

## 2022-02-11 LAB — BASIC METABOLIC PANEL
Anion gap: 9 (ref 5–15)
BUN: 13 mg/dL (ref 6–20)
CO2: 28 mmol/L (ref 22–32)
Calcium: 9.6 mg/dL (ref 8.9–10.3)
Chloride: 100 mmol/L (ref 98–111)
Creatinine, Ser: 0.77 mg/dL (ref 0.44–1.00)
GFR, Estimated: >60 mL/min (ref >60)
Glucose, Bld: 336 mg/dL — ABNORMAL HIGH (ref 70–99)
Potassium: 4.1 mmol/L (ref 3.5–5.1)
Sodium: 137 mmol/L (ref 135–145)

## 2022-02-11 LAB — CBC
HCT: 43 % (ref 36.0–46.0)
Hemoglobin: 15.1 g/dL — ABNORMAL HIGH (ref 12.0–15.0)
MCH: 30.8 pg (ref 26.0–34.0)
MCHC: 35.1 g/dL (ref 30.0–36.0)
MCV: 87.8 fL (ref 80.0–100.0)
Platelets: 324 10*3/uL (ref 150–400)
RBC: 4.9 MIL/uL (ref 3.87–5.11)
RDW: 12.9 % (ref 11.5–15.5)
WBC: 4.4 10*3/uL (ref 4.0–10.5)
nRBC: 0 % (ref 0.0–0.2)

## 2022-02-11 LAB — GLUCOSE, CAPILLARY: Glucose-Capillary: 337 mg/dL — ABNORMAL HIGH (ref 70–99)

## 2022-02-11 NOTE — Progress Notes (Addendum)
PCP - Orson Eva, NP and Dr. Sherrie Mustache takes care of her diabetes (internist  EKG - today Stress Test - 05/06/19 (have requested from [facility]) ECHO - 01/23/21 (Care Everywhere  Sleep Study - years ago CPAP - no  Fasting Blood Sugar -  Checks Blood Sugar several times a day with continuous blood sugar monitor   Anesthesia review: yes, EKG, hx of Salmonella poisoning, long Covid with PE, last A1c was 11. Doing one today.  Patient denies shortness of breath, fever, cough and chest pain at PAT appointment   All instructions explained to the patient, with a verbal understanding of the material. Patient agrees to go over the instructions while at home for a better understanding. The opportunity to ask questions was provided.

## 2022-02-12 ENCOUNTER — Encounter (HOSPITAL_COMMUNITY): Payer: Self-pay | Admitting: Vascular Surgery

## 2022-02-12 NOTE — Progress Notes (Signed)
Anesthesia Chart Review:  Case: [id number] Date/Time: 02/19/22 1400   Procedure: PANNICULECTOMY (Abdomen) - 2 hours   Anesthesia type: General   Pre-op diagnosis: Panniculitis   Location: [facility] OR ROOM 05 / [place] OR   Surgeons: Lennice Sites, MD       DISCUSSION: Patient is a 51 year old female scheduled for the above procedure. She has an abdominal pannus with recurrent rashes.  She has had about a 20 lb weight loss.   History includes DM2, asthma, OSA (not using CPAP currently), COVID-19 (+ 12/23/20; 12//28/21-1/11/22 [facility] admission for PNA and hypoxia requiring CPAP weaned to home O2 for comfort; received remdesivir, dexamethasone, Tocilizumab, apixaban), PE (01/21/21 in setting of recent COVID and in research study of placebo versus apixaban for VTE prophylaxis & removed from study; discharged home O2 & Eliquis X 3-6 months), long haul COVID, pericarditis (2021 in setting of Salmonella poisoning), anxiety, depression, obesity.    She is followed by [facility] since after COVID. According to 12/18/21 note by Dr. Lorrene Reid, she is using up to 4L O2 at rest and 5L with walking. She was getting a new CPAP machine the next day.  Her anticoagulation was discontinued as she had completed greater than 6 months of therapy post PE.  She had had recurrent recurrent syncope or presyncope spells--felt "like there is a lapse in time when she falls to when she comes to and is aware of her surroundings". Work up included a Pharmacist, community. A stress echo was also ordered, but not done yet. 10/2021 Ziopatch was normal. 12/2020 echo showed normal LV/RV systolic function, no significant valvular abnormality, no pulmonary hypertension, PASP 21 mmHg.  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  She is also followed at the [facility]. Last visit 12/10/21 by Everette Rank, [place].   My note she has had ongoing shortness of breath at rest and with activity but improving.  She was participating in [facility] pulmonary re[facility].  She also had issues with cognitive impairment "brain fog" and issues with unsteady gait and frequent falls..  A1c 10.0 on 02/12/22.  Reviewed above with anesthesiologist Annye Asa, MD. Patient has a pending stress echo and was still using up to 4-5 L O2 in December. Agree with need for pulmonary clearance. Also would want stress echo completed. Ideally, DM should be better controlled, but defer to surgeon at this point as along as not significantly hyperglycemic prior to surgery.   I have sent Dr. Erin Hearing a staff message regarding above recommendations and also hematology input from June 2022. I also spoke with Joelene Millin at his office. Asked that they notify patient and OR scheduling if items could not be addressed prior to her scheduled surgery date.   VS: BP 133/86    Pulse (!) 103    Temp 37 C    Resp 18    Ht 5' 9"  (1.753 m)    Wt 101 kg    SpO2 100%    BMI 32.89 kg/m    PROVIDERS: [name], NP is PCP - Casilda Carls, MD  is endocrinologist  - Melynda Keller, MD/[place], Raquel Sarna, MD (pulmonary fellow) is pulmonologist - Etheleen Sia, MD is [facility]). Seen on 05/29/21 for neutropenia and PE. Intermittent neutropenia noted for 15 years. "Workup to date including today negative for B12/folate deficiency, copper deficiency, HIV and HCV. Peripheral blood flow cytometry and heme path smear review without significant abnormality.  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 No further work-up recommended. In regards to PE, he recommended 6 full months anticoagulation and advised that "patient should receive good DVT prophylaxis with any future high risk situations such as major surgery, hospitalization or prolonged immobility. Also recommend that she avoid systemic hormones, particularly  estrogen. No hematology follow up required."   LABS: Preoperative labs noted. Glucose 336. A1c 10.0, consistent with average glucose 240.3. (all labs ordered are listed, but only abnormal results are displayed)  Labs Reviewed  GLUCOSE, CAPILLARY - Abnormal; Notable for the following components:      Result Value   Glucose-Capillary 337 (*)    All other components within normal limits  BASIC METABOLIC PANEL - Abnormal; Notable for the following components:   Glucose, Bld 336 (*)    All other components within normal limits  HEMOGLOBIN A1C - Abnormal; Notable for the following components:   Hgb A1c MFr Bld 10.0 (*)    All other components within normal limits  CBC - Abnormal; Notable for the following components:   Hemoglobin 15.1 (*)    All other components within normal limits    OTHER: Pulmonary Function Tests/Interpretation (as outlined 12/18/21 [facility] note): "Pulmonary Function Tests/Interpretation:  No evidence of obstruction by FEV1/FVC, but does have significant improvement in FVC and FEV1 after bronchodilator showing reversible airway flow limitation. Lung volumes with reduced TLC, borderline low RV and reduced FRC which is consistent with a parenchymal restrictive lung pattern. DLCO mild reduction.   12/18/21: Lung volumes are consistent with a moderate restrictive process. DLCO is mildly reduced.   Pertinent Laboratory Data: ESR and CRP within normal limit. CBC without eos. CMP within normal limit."   IMAGES: CT Chest high resolution 12/31/21 [facility]): Impression: *Persistent but improved residual subtle patchy subpleural groundglass opacities that could represent mild residual post-COVID interstitial lung disease.  *Triangular-shaped prevascular soft tissue density likely represents residual or hyperplastic thymus.  *Hepatic steatosis.  *Mild thyromegaly.   EKG: 05/05/19 Normal sinus rhythm Possible Left atrial enlargement Incomplete right bundle branch  block Left posterior fascicular block Poor R wave progression Abnormal ECG When compared with ECG of 26-Sep-2019 02:55, PREVIOUS ECG IS PRESENT Confirmed by Loralie Champagne [phone]) on 02/12/2022 1:04:52 AM   CV: 8-15 day ZioXT cardiac monitor 11/25/21 [facility]): Impression: Patient had a min HR of 59 bpm, max HR of 161 bpm, and avg HR of 103  bpm. Predominant underlying rhythm was Sinus Rhythm. Isolated SVEs  were rare (<1.0%), SVE Couplets were rare (<1.0%), and SVE Triplets were  rare (<1.0%). Isolated VEs were rare (<1.0%, 53), VE Triplets were rare  (<1.0%, 1), and no VE Couplets were present.   Patient triggered events were associated with sinus rhythm.   Echo 01/23/21 [facility]): Summary    1. The left ventricle is normal in size with normal wall thickness.    2. The left ventricular systolic function is normal, LVEF is visually  estimated at 55-60%.    3. The right ventricle is normal in size, with normal systolic function.     Past Medical History:  Diagnosis Date   Anxiety    Asthma    COVID 2021   Hospitalized with long Covid   Depression    Environmental allergies    Gestational diabetes    Pt is a type 2 diabetes   History of blood clots    when hospitalized for Salmonella poisoning and had  Covid at the same time   Pericarditis 2021   while in hospital for Salmonella poisoning   Sleep apnea    does not use a cpap    Past Surgical History:  Procedure Laterality Date   ABDOMINAL HYSTERECTOMY     OOPHORECTOMY     WISDOM TOOTH EXTRACTION      MEDICATIONS:  Semaglutide (RYBELSUS) 7 MG TABS   ACCU-CHEK FASTCLIX LANCETS MISC   albuterol (PROVENTIL HFA;VENTOLIN HFA) 108 (90 BASE) MCG/ACT inhaler   BAC 50-325-40 MG tablet   baclofen (LIORESAL) 10 MG tablet   BD PEN NEEDLE NANO 2ND GEN 32G X 4 MM MISC   Blood Glucose Monitoring Suppl (ACCU-CHEK GUIDE) w/Device KIT   DULoxetine (CYMBALTA) 60 MG capsule   FARXIGA 10 MG TABS tablet   Fluocinolone Acetonide Scalp  0.01 % OIL   fluticasone (FLONASE) 50 MCG/ACT nasal spray   fluticasone (FLOVENT HFA) 110 MCG/ACT inhaler   furosemide (LASIX) 20 MG tablet   gabapentin (NEURONTIN) 300 MG capsule   glucose blood (ACCU-CHEK GUIDE) test strip   glucose blood ([employer] BLOOD GLUCOSE TEST) test strip   HUMALOG KWIKPEN 100 UNIT/ML KwikPen   ibuprofen (ADVIL,MOTRIN) 600 MG tablet   icosapent Ethyl (VASCEPA) 1 g capsule   insulin degludec (TRESIBA FLEXTOUCH) 100 UNIT/ML FlexTouch Pen   Insulin Pen Needle (GLOBAL EASY GLIDE PEN NEEDLES) 32G X 4 MM MISC   Lancets Misc. (ACCU-CHEK FASTCLIX LANCET) KIT   metoprolol succinate (TOPROL-XL) 50 MG 24 hr tablet   metoprolol tartrate (LOPRESSOR) 50 MG tablet   NEXLIZET 180-10 MG TABS   olmesartan (BENICAR) 5 MG tablet   omeprazole (PRILOSEC) 20 MG capsule   ONETOUCH VERIO test strip   OXYGEN   phenazopyridine (PYRIDIUM) 95 MG tablet   rosuvastatin (CRESTOR) 40 MG tablet   SYMBICORT 160-4.5 MCG/ACT inhaler   traZODone (DESYREL) 100 MG tablet   No current facility-administered medications for this encounter.    Myra Gianotti, PA-C Surgical Short Stay/Anesthesiology [facility] Phone [phone] [facility] Phone [phone] 02/13/2022 11:06 AM

## 2022-02-14 ENCOUNTER — Telehealth: Payer: Self-pay

## 2022-02-14 NOTE — Telephone Encounter (Signed)
Faxed surgical/pulmonary clearance to Odette Horns, MD

## 2022-02-18 DIAGNOSIS — I10 Essential (primary) hypertension: Secondary | ICD-10-CM | POA: Diagnosis not present

## 2022-02-18 DIAGNOSIS — E1165 Type 2 diabetes mellitus with hyperglycemia: Secondary | ICD-10-CM | POA: Diagnosis not present

## 2022-02-18 DIAGNOSIS — E669 Obesity, unspecified: Secondary | ICD-10-CM | POA: Diagnosis not present

## 2022-02-18 DIAGNOSIS — E782 Mixed hyperlipidemia: Secondary | ICD-10-CM | POA: Diagnosis not present

## 2022-02-19 ENCOUNTER — Ambulatory Visit (HOSPITAL_COMMUNITY): Admission: RE | Admit: 2022-02-19 | Payer: Medicaid Other | Source: Home / Self Care | Admitting: Plastic Surgery

## 2022-02-19 ENCOUNTER — Encounter (HOSPITAL_COMMUNITY): Admission: RE | Payer: Self-pay | Source: Home / Self Care

## 2022-02-19 SURGERY — PANNICULECTOMY
Anesthesia: General | Site: Abdomen

## 2022-02-20 DIAGNOSIS — E785 Hyperlipidemia, unspecified: Secondary | ICD-10-CM | POA: Diagnosis not present

## 2022-02-20 DIAGNOSIS — K219 Gastro-esophageal reflux disease without esophagitis: Secondary | ICD-10-CM | POA: Diagnosis not present

## 2022-02-20 DIAGNOSIS — E1165 Type 2 diabetes mellitus with hyperglycemia: Secondary | ICD-10-CM | POA: Diagnosis not present

## 2022-02-20 DIAGNOSIS — I361 Nonrheumatic tricuspid (valve) insufficiency: Secondary | ICD-10-CM | POA: Diagnosis not present

## 2022-02-20 DIAGNOSIS — G43009 Migraine without aura, not intractable, without status migrainosus: Secondary | ICD-10-CM | POA: Diagnosis not present

## 2022-02-21 ENCOUNTER — Other Ambulatory Visit: Payer: Self-pay | Admitting: Obstetrics and Gynecology

## 2022-02-21 ENCOUNTER — Other Ambulatory Visit: Payer: Self-pay

## 2022-02-21 DIAGNOSIS — J1282 Pneumonia due to coronavirus disease 2019: Secondary | ICD-10-CM

## 2022-02-21 DIAGNOSIS — U071 COVID-19: Secondary | ICD-10-CM

## 2022-02-21 NOTE — Patient Outreach (Signed)
Medicaid Managed Care   Nurse Care Manager Note  02/21/2022 Name:  Crystal Gibson MRN:  [id number] DOB:  12-22-1971  Crystal Gibson is an 51 y.o. year old female who is a primary patient of Crystal Berry, NP.  The Crystal Gibson Managed Care Coordination team was consulted for assistance with:    Chronic healthcare management needs, DM2, post COVID, resources,   Crystal Gibson was given information about Medicaid Managed Care Coordination team services today. Crystal Gibson Patient agreed to services and verbal consent obtained.  Engaged with patient by telephone for initial visit in response to provider referral for case management and/or care coordination services.   Assessments/Interventions:  Review of past medical history, allergies, medications, health status, including review of consultants reports, laboratory and other test data, was performed as part of comprehensive evaluation and provision of chronic care management services.  SDOH (Social Determinants of Health) assessments and interventions performed: SDOH Interventions    Flowsheet Row Most Recent Value  SDOH Interventions   Intimate Partner Violence Interventions Intervention Not Indicated  Transportation Interventions Intervention Not Indicated      Care Plan  Allergies  Allergen Reactions   Glucophage [Metformin] Nausea And Vomiting    Medications Reviewed Today     Reviewed by Crystal Medicus, RN (Registered Nurse) on 02/21/22 at 67  Med List Status: <None>   Medication Order Taking? Sig Documenting Provider Last Dose Status Informant  ACCU-CHEK FASTCLIX LANCETS [place] [id number] No  [provider] Taking Active Self  albuterol (PROVENTIL HFA;VENTOLIN HFA) 108 (90 BASE) MCG/ACT inhaler [id number]  Inhale 2 puffs into the lungs every 4 (four) hours as needed for wheezing or shortness of breath. Brynda Peon  Active Self  BAC 50-325-40 MG tablet [id number]  Take 1 tablet by mouth every 4 (four) hours  as needed for headache. [provider]  Active Self  baclofen (LIORESAL) 10 MG tablet [id number]  Take 10 mg by mouth 3 (three) times daily. [provider]  Active Self  BD PEN NEEDLE NANO 2ND GEN 32G X 4 MM MISC [id number] No USE THREE TIMES DAILY BEFORE MEALS [provider] Taking Active Self  Blood Glucose Monitoring Suppl (ACCU-CHEK GUIDE) w/Device KIT [id number] No USE DEVICE TO CHECK SUGARS DAILY [provider] Taking Active Self  DULoxetine (CYMBALTA) 60 MG capsule [id number] No Take 60 mg by mouth daily. [provider] Taking Active Self  FARXIGA 10 MG TABS tablet [id number] No Take 10 mg by mouth daily. [provider] Taking Active Self  Fluocinolone Acetonide Scalp 0.01 % OIL [id number] No Apply 1 application topically every 7 (seven) days. [provider] Taking Active Self  fluticasone (FLONASE) 50 MCG/ACT nasal spray [id number] No Place 1 spray into both nostrils 2 (two) times daily.  Patient not taking: Reported on 02/07/2022   Darletta Moll, PA-C Not Taking Active Self  fluticasone (FLOVENT HFA) 110 MCG/ACT inhaler [id number] No Inhale 2 puffs into the lungs daily. [provider] Taking Active Self  furosemide (LASIX) 20 MG tablet [id number]  Take 20 mg by mouth daily. [provider]  Active Self  gabapentin (NEURONTIN) 300 MG capsule [id number] No Take 1 capsule (300 mg total) by mouth 3 (three) times daily. [name], DPM Taking Active Self  glucose blood (ACCU-CHEK GUIDE) test strip [id number] No Accu-Chek Guide In Vitro Strip QTY: 200 strip Days: 90 Refills: 1  Written: 04/27/20 Patient Instructions: TEST FASTING BLOOD SUGAR EVERY MORNING FASTING AND EVERY EVENING  E11.65 [provider] Taking Active Self  glucose blood ([employer] BLOOD GLUCOSE TEST) test strip [id number] No Use to check blood sugar as directed with insulin 3 times a day & for symptoms of high or low blood sugar. [provider] Taking Active Self  HUMALOG KWIKPEN 100 UNIT/ML KwikPen [id number] No Inject 2-8 Units into the skin 3 (three) times daily before meals. [provider] Taking Active Self  ibuprofen (ADVIL,MOTRIN) 600 MG tablet [id number] No Take 1 tablet (600 mg total) by mouth every 6 (six) hours as needed.  Patient not taking: Reported on 02/07/2022   Laban Emperor, PA-C Not Taking Active Self  icosapent Ethyl (VASCEPA) 1 g capsule [id number] No Take 2 g by mouth 2 (two) times daily. [provider] Taking Active Self  insulin degludec (TRESIBA FLEXTOUCH) 100 UNIT/ML FlexTouch Pen [id number] No Inject 35 Units into the skin daily. [provider] Taking Active Self  Insulin Pen Needle (GLOBAL EASY GLIDE PEN NEEDLES) 32G X 4 MM MISC [id number] No Use for injections three (3) times a day before meals. [provider] Taking Active Self  Lancets Misc. (ACCU-CHEK FASTCLIX LANCET) KIT [id number] No Accu-Chek Fastclix Lancet Drum  USE TO CHECK FASTING IN AM AND AS NEEDED FOR HIGH OR LOW [provider] Taking Active Self  metoprolol succinate (TOPROL-XL) 50 MG 24 hr tablet [id number] No Take by mouth. [provider] Taking Active Self  metoprolol tartrate (LOPRESSOR) 50 MG tablet [id number] No Take 1 tablet (50 mg total) by mouth daily. Larene Pickett, PA-C Taking Active Self  NEXLIZET 180-10 MG TABS [id number] No Take 1 tablet by mouth daily. [provider] Taking Active Self  olmesartan (BENICAR) 5 MG tablet [id number] No Take 5 mg by mouth daily. [provider] Taking Active Self  omeprazole (PRILOSEC) 20 MG capsule [id number] No Take 20 mg by mouth daily. [provider] Taking Active Self  ONETOUCH VERIO test strip [id number] No 1 each 4 (four) times daily. [provider] Taking Active Self  OXYGEN [id number] No Inhale 2 L into the lungs as needed (exertion). [provider] Taking Active Self   phenazopyridine (PYRIDIUM) 95 MG tablet [id number] No Take 1 tablet (95 mg total) by mouth 3 (three) times daily as needed for pain. Cuthriell, [name], PA-C Taking Active Self  rosuvastatin (CRESTOR) 40 MG tablet [id number] No Take 40 mg by mouth daily. [provider] Taking Active Self  Semaglutide (RYBELSUS) 7 MG TABS [id number]  Take 1 tablet by mouth daily. [provider]  Active   SYMBICORT 160-4.5 MCG/ACT inhaler [id number]  Inhale 2 puffs into the lungs in the morning and at bedtime. [provider]  Active Self  traZODone (DESYREL) 100 MG tablet [id number] No Take 200 mg by mouth at bedtime.  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 03/06/2021   Chronic respiratory failure with hypoxia ([place]) 03/06/2021   Multiple subsegmental pulmonary emboli without acute cor pulmonale ([place]) 03/06/2021   OSA (obstructive sleep apnea) 03/06/2021   Sesamoiditis of foot 11/05/2020   Ganglion cyst of tendon sheath of left hand 11/03/2018   Displacement of lumbar intervertebral disc without myelopathy 11/03/2018   Lumbar sprain 11/03/2018   Sprain of ligament of tarsometatarsal joint 04/17/2016   Health examination of defined subpopulation 09/14/2014   Conditions to be addressed/monitored per PCP order:  Chronic healthcare management needs, DM2, post COVID, resources, SOB, h/o pulmonary emboli, OSA  Care Plan : RN Care Manager Plan of Care  Updates made by Crystal Medicus, RN since 02/21/2022 12:00 AM     Problem: Chronic Disease Management and Care Coordination Needs   Priority: High     Long-Range Goal: Self-Management Plan Developed   Start Date: 02/21/2022  Expected End Date: 05/21/2022  This Visit's Progress: Not on track  Priority: High  Note:   Current Barriers:  Care Coordination needs related to Financial constraints related to post COVID and  needing assistance with food and paying mortgage, Medication procurement, and Lacks knowledge of community resource: for food resources and mortgage assistance Chronic Disease Management support and education needs 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  Film/video editor  Difficulty obtaining medications  RNCM Clinical Goal(s):  Patient will take all medications exactly as prescribed and will call provider for medication related questions as evidenced by patient report attend all scheduled medical appointments as evidenced by patient report and patient appointment  continue to work with RN Care Manager to address care management and care coordination needs related to  DMII and post COVID as evidenced by adherence to CM Team Scheduled appointments work with pharmacist to address Medication procurement related toDMII as evidenced by review or EMR and patient or pharmacist report work with community resource care guide to address needs related to  Financial constraints related to food and mortgage as evidenced by patient and/or community resource care guide support through collaboration with Consulting civil engineer, provider, and care team.   Interventions: Inter-disciplinary care team collaboration (see longitudinal plan of care) Evaluation of current treatment plan related to  self management and patient's adherence to plan as established by provider Collaborated with Pharmacy and [employer] for medication, food resources, and mortgage assistance Follow Up with [place] [facility] regarding insulin  Diabetes Interventions:  (Status:  New goal.) Long Term Goal Assessed patient's understanding of A1c goal: <7% Discussed plans with patient for ongoing care management follow up and provided patient with direct contact information for care management team Reviewed scheduled/upcoming provider appointments including Referral made to pharmacy team for assistance with medications Referral made to community  resources care guide team for assistance with food resources and mortgage assistance Review of patient status, including review of consultants reports, relevant laboratory and other test results, and medications completed Assessed social determinant of health barriers Lab Results  Component Value Date   HGBA1C 10.0 (H) 02/11/2022   Patient Goals/Self-Care Activities: Take all medications as prescribed Attend all scheduled provider appointments Call pharmacy for medication refills 3-7 days in advance of running out of medications Perform all self care activities independently  Perform IADL's (shopping, preparing meals, housekeeping, managing finances) independently Call provider office for new concerns or questions  Patient has contacted [facility] Medicaid Ombudsman for assistance with medication procurement  Follow Up Plan:  The patient has been provided with contact information for the care management team and has been advised to call with any health related questions or concerns.  The care management team will reach out to the patient again over the next 30 days.     Long-Range Goal: Establish Plan of Care  for Chronic Disease Management Needs   Start Date: 02/21/2022  Expected End Date: 05/21/2022  Priority: High  Note:   Timeframe:  Long-Range Goal Priority:  High Start Date:      02/21/22  Expected End Date:   ongoing                    Follow Up Date 03/21/22    - schedule appointment for flu shot - schedule appointment for vaccines needed due to my age or health - schedule recommended health tests (blood work, mammogram, colonoscopy, pap test) - schedule and keep appointment for annual check-up    Why is this important?   Screening tests can find diseases early when they are easier to treat.  Your doctor or nurse will talk with you about which tests are important for you.  Getting shots for common diseases like the flu and shingles will help prevent them.      Follow Up:  Patient agrees to Care Plan and Follow-up.  Plan: The Managed Medicaid care management team will reach out to the patient again over the next 30 days. and The  Patient has been provided with contact information for the Managed Medicaid care management team and has been advised to call with any health related questions or concerns.  Date/time of next scheduled RN care management/care coordination outreach:  03/21/22 at 230

## 2022-02-21 NOTE — Patient Instructions (Signed)
Visit Information  Ms. Newsum was given information about Medicaid Managed Care team care coordination services as a part of their [place] Complete Medicaid benefit. [name] verbally consented to engagement with the [facility] Managed Care team.   If you are experiencing a medical emergency, please call 911 or report to your local emergency department or urgent care.   If you have a non-emergency medical problem during routine business hours, please contact your provider's office and ask to speak with a nurse.   For questions related to your [place] Complete Medicaid health plan, please call: [phone]  If you would like to schedule transportation through your [place] Complete Medicaid plan, please call the following number at least 2 days in advance of your appointment: [phone].   There is no limit to the number of trips during the year between medical appointments, healthcare facilities, or pharmacies. Transportation must be scheduled at least 2 business days before but not more than thirty 30 days before of your appointment.  Call the [place] at [phone], at any time, 24 hours a day, 7 days a week. If you are in danger or need immediate medical attention call 911.  If you would like help to quit smoking, call 1-800-QUIT-NOW 513-258-8229) OR Espaol: 1-855-Djelo-Ya QO:409462) o para ms informacin haga clic aqu or Text READY to 200-400 to register via text  Ms. Texter - following are the goals we discussed in your visit today:   Timeframe:  Long-Range Goal Priority:  High Start Date:      02/21/22                       Expected End Date:   ongoing                    Follow Up Date 03/21/22    - schedule appointment for flu shot - schedule appointment for vaccines needed due to my age or health - schedule recommended health tests (blood work, mammogram, colonoscopy, pap test) - schedule and keep appointment for annual check-up    Why  is this important?   Screening tests can find diseases early when they are easier to treat.  Your doctor or nurse will talk with you about which tests are important for you.  Getting shots for common diseases like the flu and shingles will help prevent them.     The patient verbalized understanding of instructions, educational materials, and care plan provided today and agreed to receive a mailed copy of patient instructions, educational materials, and care plan.   The Managed Medicaid care management team will reach out to the patient again over the next 30 days.  The  Patient  has been provided with contact information for the Managed Medicaid care management team and has been advised to call with any health related questions or concerns.   Aida Raider RN, BSN [place] Management Coordinator - Managed Medicaid High Risk [phone]   Following is a copy of your plan of care:  Care Plan : [place] of Care  Updates made by Gayla Medicus, RN since 02/21/2022 12:00 AM     Problem: Chronic Disease Management and Care Coordination Needs   Priority: High     Long-Range Goal: Self-Management Plan Developed   Start Date: 02/21/2022  Expected End Date: 05/21/2022  This Visit's Progress: Not on track  Priority: High  Note:   Current Barriers:  Care Coordination needs related to Financial constraints related to post COVID and needing assistance with food and paying mortgage, Medication procurement, and Lacks knowledge of community resource: for food resources and mortgage assistance Chronic Disease Management support and education needs 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  Financial Constraints  Difficulty obtaining medications  RNCM Clinical Goal(s):  Patient will take all medications exactly as prescribed and will call provider for medication related questions as evidenced by patient report attend all scheduled medical appointments as evidenced  by patient report and patient appointment  continue to work with RN Care Manager to address care management and care coordination needs related to  DMII and post COVID as evidenced by adherence to CM Team Scheduled appointments work with pharmacist to address Medication procurement related toDMII as evidenced by review or EMR and patient or pharmacist report work with community resource care guide to address needs related to  Financial constraints related to food and mortgage as evidenced by patient and/or community resource care guide support through collaboration with Consulting civil engineer, provider, and care team.   Interventions: Inter-disciplinary care team collaboration (see longitudinal plan of care) Evaluation of current treatment plan related to  self management and patient's adherence to plan as established by provider Collaborated with Pharmacy and [employer] for medication, food resources, and mortgage assistance Follow Up with [place] Complete regarding insulin  Diabetes Interventions:  (Status:  New goal.) Long Term Goal Assessed patient's understanding of A1c goal: <7% Discussed plans with patient for ongoing care management follow up and provided patient with direct contact information for care management team Reviewed scheduled/upcoming provider appointments including Referral made to pharmacy team for assistance with medications Referral made to community resources care guide team for assistance with food resources and mortgage assistance Review of patient status, including review of consultants reports, relevant laboratory and other test results, and medications completed Assessed social determinant of health barriers Lab Results  Component Value Date   HGBA1C 10.0 (H) 02/11/2022   Patient Goals/Self-Care Activities: Take all medications as prescribed Attend all scheduled provider appointments Call pharmacy for medication refills 3-7 days in advance of running out of  medications Perform all self care activities independently  Perform IADL's (shopping, preparing meals, housekeeping, managing finances) independently Call provider office for new concerns or questions  Patient has contacted [facility] Medicaid Ombudsman for assistance with medication procurement  Follow Up Plan:  The patient has been provided with contact information for the care management team and has been advised to call with any health related questions or concerns.  The care management team will reach out to the patient again over the next 30 days.

## 2022-02-24 ENCOUNTER — Other Ambulatory Visit: Payer: Self-pay

## 2022-02-25 ENCOUNTER — Telehealth: Payer: Self-pay

## 2022-02-25 NOTE — Telephone Encounter (Signed)
° °  Telephone encounter was:  Unsuccessful.  02/25/2022 Name: Crystal Gibson MRN: [id number] DOB: February 07, 1971  Unsuccessful outbound call made today to assist with:  Food Insecurity and mortgage  Outreach Attempt:  1st Attempt  A HIPAA compliant voice message was left requesting a return call.  Instructed patient to call back at [phone].  Tama Grosz, AAS Paralegal, [facility] Care Guide  Embedded Care Coordination [place]   Care Management  [address] [place], [place] [address] ??[email or web address]   ?? [phone]   [email or web address]

## 2022-02-27 ENCOUNTER — Other Ambulatory Visit: Payer: Self-pay | Admitting: Obstetrics and Gynecology

## 2022-02-27 ENCOUNTER — Other Ambulatory Visit: Payer: Self-pay

## 2022-02-27 DIAGNOSIS — G4730 Sleep apnea, unspecified: Secondary | ICD-10-CM | POA: Diagnosis not present

## 2022-02-27 DIAGNOSIS — R079 Chest pain, unspecified: Secondary | ICD-10-CM | POA: Diagnosis not present

## 2022-02-27 DIAGNOSIS — R000 Tachycardia, unspecified: Secondary | ICD-10-CM | POA: Diagnosis not present

## 2022-02-27 DIAGNOSIS — I471 Supraventricular tachycardia: Secondary | ICD-10-CM | POA: Diagnosis not present

## 2022-02-27 DIAGNOSIS — E785 Hyperlipidemia, unspecified: Secondary | ICD-10-CM | POA: Diagnosis not present

## 2022-02-27 DIAGNOSIS — E119 Type 2 diabetes mellitus without complications: Secondary | ICD-10-CM | POA: Diagnosis not present

## 2022-02-27 DIAGNOSIS — R0602 Shortness of breath: Secondary | ICD-10-CM | POA: Diagnosis not present

## 2022-02-27 DIAGNOSIS — E669 Obesity, unspecified: Secondary | ICD-10-CM | POA: Diagnosis not present

## 2022-02-27 DIAGNOSIS — I10 Essential (primary) hypertension: Secondary | ICD-10-CM | POA: Diagnosis not present

## 2022-02-27 NOTE — Patient Outreach (Signed)
Care Coordination ? ?02/27/2022 ? ?EVERLEANER LINA ?22-Oct-1971 ?[id number] ? ?RNCM contacted Jiles Prows, Clinical Nurse Liaison [place] [facility] , regarding patient's insulin coverage each month and delays in getting prescriptions filled.   RNCM left message. ? ?Aida Raider RN, BSN ?[facility] ?Care Management Coordinator - Managed Medicaid High Risk ?[phone] ?  ? ? ?

## 2022-02-27 NOTE — Patient Outreach (Signed)
Care Coordination ? ?02/27/2022 ? ?Crystal Gibson ?11-24-1971 ?[id number] ? ?RNCM returned phone call to [facility], [place] Managed Medicaid Clinical Liaison, left message. ? ?Kathi Der RN, BSN ?[place]  [facility] ?Care Management Coordinator - Managed Medicaid High Risk ?[phone] ?  ? ? ? ?

## 2022-02-28 ENCOUNTER — Encounter: Payer: Medicaid Other | Admitting: Plastic Surgery

## 2022-02-28 ENCOUNTER — Telehealth: Payer: Self-pay | Admitting: Plastic Surgery

## 2022-02-28 ENCOUNTER — Telehealth: Payer: Self-pay

## 2022-02-28 NOTE — Telephone Encounter (Signed)
Patient left voice message - states saw her primary care doctor last week and cardiologist this week - states both have cleared her for surgery; said that both have not received paperwork from our office. Please advise. Ph [phone] ?

## 2022-02-28 NOTE — Telephone Encounter (Signed)
? ?  Telephone encounter was:  Unsuccessful.  02/28/2022 ?Name: Crystal Gibson MRN: [id number] DOB: [birth year] ? ?Unsuccessful outbound call made today to assist with:  Food Insecurity and Financial Difficulties related to mortgage ? ?Outreach Attempt:  2nd Attempt ? ?A HIPAA compliant voice message was left requesting a return call.  Instructed patient to call back at [phone]. ? ?Maricruz Lucero, AAS Paralegal, CHC ?Care Guide  Embedded Care Coordination ?[place]  Care Management  ?[address] ?[place], [place] [address] ?[name]  ?? [phone]   ?[email or web address] ?  ?

## 2022-03-04 ENCOUNTER — Telehealth: Payer: Self-pay

## 2022-03-04 NOTE — Telephone Encounter (Signed)
? ?  Telephone encounter was:  Unsuccessful.  03/04/2022 ?Name: Crystal Gibson MRN: [id number] DOB: 03-03-1971 ? ?Unsuccessful outbound call made today to assist with:  Food Insecurity and mortgage ? ?Outreach Attempt:  3rd Attempt.  Referral closed unable to contact patient. ?A HIPAA compliant voice message was left requesting a return call.  Instructed patient to call back at [phone]. ? ?Kerianne Gurr, AAS Paralegal, CHC ?Care Guide  Embedded Care Coordination ?[place]  Care Management  ?[address] ?[place], [place] [address] ???[email or web address]  ?? [phone]   ?[email or web address] ?  ?

## 2022-03-06 DIAGNOSIS — R41841 Cognitive communication deficit: Secondary | ICD-10-CM | POA: Diagnosis not present

## 2022-03-06 DIAGNOSIS — U099 Post covid-19 condition, unspecified: Secondary | ICD-10-CM | POA: Diagnosis not present

## 2022-03-10 DIAGNOSIS — G4733 Obstructive sleep apnea (adult) (pediatric): Secondary | ICD-10-CM | POA: Diagnosis not present

## 2022-03-10 DIAGNOSIS — R072 Precordial pain: Secondary | ICD-10-CM | POA: Diagnosis not present

## 2022-03-11 ENCOUNTER — Telehealth: Payer: Self-pay

## 2022-03-11 DIAGNOSIS — I10 Essential (primary) hypertension: Secondary | ICD-10-CM | POA: Diagnosis not present

## 2022-03-11 DIAGNOSIS — R079 Chest pain, unspecified: Secondary | ICD-10-CM | POA: Diagnosis not present

## 2022-03-11 DIAGNOSIS — I471 Supraventricular tachycardia: Secondary | ICD-10-CM | POA: Diagnosis not present

## 2022-03-11 DIAGNOSIS — E785 Hyperlipidemia, unspecified: Secondary | ICD-10-CM | POA: Diagnosis not present

## 2022-03-11 DIAGNOSIS — G4730 Sleep apnea, unspecified: Secondary | ICD-10-CM | POA: Diagnosis not present

## 2022-03-11 DIAGNOSIS — E669 Obesity, unspecified: Secondary | ICD-10-CM | POA: Diagnosis not present

## 2022-03-11 DIAGNOSIS — E1165 Type 2 diabetes mellitus with hyperglycemia: Secondary | ICD-10-CM | POA: Diagnosis not present

## 2022-03-11 NOTE — Telephone Encounter (Signed)
Faxed signed release of records, surgery clearance for last A1C labs. If not recent, order new one. ?

## 2022-03-11 NOTE — Telephone Encounter (Signed)
Faxed signed release of record, and surgical clearance form. Will need to order a new Echo-Cardiogram. ?

## 2022-03-13 DIAGNOSIS — U099 Post covid-19 condition, unspecified: Secondary | ICD-10-CM | POA: Diagnosis not present

## 2022-03-13 DIAGNOSIS — R41841 Cognitive communication deficit: Secondary | ICD-10-CM | POA: Diagnosis not present

## 2022-03-13 NOTE — Telephone Encounter (Signed)
Surgical clearance fax would not go through to Orson Eva, NP. Called office and faxed to a different fax of [phone] ?

## 2022-03-14 DIAGNOSIS — G4733 Obstructive sleep apnea (adult) (pediatric): Secondary | ICD-10-CM | POA: Diagnosis not present

## 2022-03-17 DIAGNOSIS — Z01810 Encounter for preprocedural cardiovascular examination: Secondary | ICD-10-CM | POA: Diagnosis not present

## 2022-03-18 ENCOUNTER — Other Ambulatory Visit: Payer: Medicaid Other | Admitting: Pharmacist

## 2022-03-18 NOTE — Patient Instructions (Signed)
It was great to speak with you today! ? ? ?Visit Information ? ?Crystal Gibson was given information about Medicaid Managed Care team care coordination services as a part of their [place] Complete Medicaid benefit. Crystal Gibson verbally consented to engagement with the [facility] Managed Care team.  ? ?If you are experiencing a medical emergency, please call 911 or report to your local emergency department or urgent care.  ? ?If you have a non-emergency medical problem during routine business hours, please contact your provider's office and ask to speak with a nurse.  ? ?For questions related to your [place] Complete Medicaid health plan, please call: [phone] ? ?If you would like to schedule transportation through your [place] Complete Medicaid plan, please call the following number at least 2 days in advance of your appointment: [phone].  ? There is no limit to the number of trips during the year between medical appointments, healthcare facilities, or pharmacies. Transportation must be scheduled at least 2 business days before but not more than thirty 30 days before of your appointment. ? ? ?Crystal Gibson, PharmD, BCACP ?[facility] ? ?

## 2022-03-18 NOTE — Patient Outreach (Signed)
? ?   ? ?Chief Complaint  ?Patient presents with  ? High Risk Managed Medicaid  ? ? ?Crystal Gibson is a 51 y.o. year old female who was referred for medication management by their primary care provider, [name], NP. They presented for a telephone visit in the context of the COVID-19 pandemic. ?  ?They were referred to the pharmacist by their High Risk Managed Medicaid Care Team  for assistance in managing diabetes, hyperlipidemia, and medication access.  ? ?PCP - Evern Bio, NP - [facility]  ?Endocrinology - Dr. Rosario Jacks  ?Cardiology - Dr. Neoma Laming ? ? ?Subjective: ?Diabetes: ? ?Current medications: prescribed Tresiba 35 units daily, Novolog per sliding scale, but reports she has not received insurance authorization for either of these; Farxiga 10 mg daily, Rybelsus 7 mg daily - reports she has received insurance authorization for this   ?Medications tried in the past: per her report, therapeutic failure with Lantus, insulin glargine, Levemir, Humalog, insulin lispro; Victoza, Ozempic (reports injection site reaction, due to this, was advised by her endocrinologist that she would not be a candidate for Byetta or Bydureon);  ? ?Current glucose readings: using DexCom G6, reports significant fluctuations ranging from 150-400s depending on what insulin she has ? ?Patient denies hypoglycemic s/sx including dizziness, shakiness, sweating. ? ? ?Hyperlipidemia/ASCVD Risk Reduction ? ?Current lipid lowering medications: rosuvastatin 40 mg daily, prescribed Repatha 140 mg Q14 days, reports she just received authorization and will pick up today; prescribed Vascepa 2 g twice daily and Nexlizet 180/10 mg daily, but reports she has not received insurance authorization for either ? ?Objective: ?Lab Results  ?Component Value Date  ? HGBA1C 10.0 (H) 02/11/2022  ? ? ?Lab Results  ?Component Value Date  ? CREATININE 0.77 02/11/2022  ? BUN 13 02/11/2022  ? NA 137 02/11/2022  ? K 4.1 02/11/2022  ? CL 100  02/11/2022  ? CO2 28 02/11/2022  ? ? ?No results found for: CHOL, HDL, LDLCALC, LDLDIRECT, TRIG, CHOLHDL ? ?Medications Reviewed Today   ? ? Reviewed by De Hollingshead, RPH-CPP (Pharmacist) on 03/18/22 at [place] List Status: <None>  ? ?Medication Order Taking? Sig Documenting Provider Last Dose Status Informant  ?ACCU-CHEK FASTCLIX LANCETS [place] [id number] Yes  [provider] Taking Active Self  ?albuterol (PROVENTIL HFA;VENTOLIN HFA) 108 (90 BASE) MCG/ACT inhaler [id number] Yes Inhale 2 puffs into the lungs every 4 (four) hours as needed for wheezing or shortness of breath. Cuthriell, [name], PA-C Taking Active Self  ?ARIPiprazole (ABILIFY) 15 MG tablet [id number]  Take 15 mg by mouth daily. [provider]  Active   ?baclofen (LIORESAL) 10 MG tablet [id number] Yes Take 10 mg by mouth 3 (three) times daily. [provider] Taking Active Self  ?BD PEN NEEDLE NANO 2ND GEN 32G X 4 MM MISC [id number] Yes USE THREE TIMES DAILY BEFORE MEALS [provider] Taking Active Self  ?Blood Glucose Monitoring Suppl (ACCU-CHEK GUIDE) w/Device KIT [id number] Yes USE DEVICE TO CHECK SUGARS DAILY [provider] Taking Active Self  ?Butalbital-APAP-Caffeine 50-300-40 MG CAPS [id number]   [provider]  Active   ?Continuous Blood Gluc Sensor (DEXCOM G6 SENSOR) MISC [id number] Yes Apply 1 each topically as needed. [provider] Taking Active   ?DULoxetine (CYMBALTA) 60 MG capsule [id number] Yes Take 60 mg by mouth daily. [provider] Taking Active Self  ?Evolocumab (REPATHA SURECLICK) 785 MG/ML SOAJ [id number] Yes Inject 140 mg into the skin every 14 (fourteen) days. [provider]  Taking Active   ?famotidine (PEPCID) 20 MG tablet [id number] Yes Take 20 mg by mouth daily. [provider]  Active   ?FARXIGA 10 MG TABS tablet [id number] Yes Take 10 mg by mouth daily. [provider] Taking Active Self  ?Fluocinolone Acetonide Scalp  0.01 % OIL [id number] Yes Apply 1 application topically every 7 (seven) days. [provider] Taking Active Self  ?fluticasone (FLONASE) 50 MCG/ACT nasal spray [id number] Yes Place 1 spray into both nostrils 2 (two) times daily. Cuthriell, [name], PA-C Taking Active Self  ?furosemide (LASIX) 20 MG tablet [id number] Yes Take 20 mg by mouth daily. [provider] Taking Active Self  ?gabapentin (NEURONTIN) 300 MG capsule [id number] Yes Take 1 capsule (300 mg total) by mouth 3 (three) times daily. [name], DPM Taking Active Self  ?glucose blood (ACCU-CHEK GUIDE) test strip [id number] Yes Accu-Chek Guide In Vitro Strip QTY: 200 strip Days: 90 Refills: 1  Written: 04/27/20 Patient Instructions: TEST FASTING BLOOD SUGAR EVERY MORNING FASTING AND EVERY EVENING E11.65 [provider] Taking Active Self  ?glucose blood ([employer] BLOOD GLUCOSE TEST) test strip [id number] Yes Use to check blood sugar as directed with insulin 3 times a day & for symptoms of high or low blood sugar. [provider] Taking Active Self  ?icosapent Ethyl (VASCEPA) 1 g capsule [id number] No Take 2 g by mouth 2 (two) times daily.  ?Patient not taking: Reported on 03/18/2022  ? [provider] Not Taking Active Self  ?insulin degludec (TRESIBA FLEXTOUCH) 100 UNIT/ML FlexTouch Pen [id number] No Inject 35 Units into the skin daily.  ?Patient not taking: Reported on 03/18/2022  ? [provider] Not Taking Active Self  ?Insulin Pen Needle (GLOBAL EASY GLIDE PEN NEEDLES) 32G X 4 MM MISC [id number] Yes Use for injections three (3) times a day before meals. [provider] Taking Active Self  ?Lancets Misc. (ACCU-CHEK FASTCLIX LANCET) KIT [id number] Yes Accu-Chek Fastclix Lancet Drum ? USE TO CHECK FASTING IN AM AND AS NEEDED FOR HIGH OR LOW [provider] Taking Active Self  ?metoprolol succinate (TOPROL-XL) 100 MG 24 hr tablet [id number] Yes Take 100 mg by mouth. [provider] Taking Active Self  ?NEXLIZET 180-10 MG TABS [id number] No Take 1 tablet by mouth daily.  ?Patient not taking: Reported on 03/18/2022  ? [provider] Not Taking Active Self  ?NOVOLOG FLEXPEN 100 UNIT/ML FlexPen [id number] No SMARTSIG:10 Unit(s) SUB-Q  ?Patient not taking: Reported on 03/18/2022  ? [provider] Not Taking Active   ?olmesartan (BENICAR) 5 MG tablet [id number] Yes Take 5 mg by mouth daily. [provider] Taking Active Self  ?ONETOUCH VERIO test strip [id number] Yes 1 each 4 (four) times daily. [provider] Taking Active Self  ?OXYGEN [id number] Yes Inhale 2 L into the lungs as needed (exertion). [provider] Taking Active Self  ?pantoprazole (PROTONIX) 40 MG tablet [id number] Yes Take 40 mg by mouth daily. [provider] Taking Active   ?rosuvastatin (CRESTOR) 40 MG tablet [id number] Yes Take 40 mg by mouth daily. [provider] Taking Active Self  ?Semaglutide (RYBELSUS) 7 MG TABS [id number] Yes Take 1 tablet by mouth daily. [provider] Taking Active   ?SYMBICORT 160-4.5 MCG/ACT inhaler [id number] Yes Inhale 2 puffs into the lungs in the morning and at bedtime. [provider] Taking Active Self  ?topiramate (TOPAMAX) 25 MG tablet [id number] Yes Take 50 mg by mouth at bedtime. [provider] Taking Active   ?traZODone (  DESYREL) 100 MG tablet [id number] Yes Take 200 mg by mouth at bedtime.  [provider] Taking Active Self  ? ?  ?  ? ?  ? ? ?Assessment/Plan:  ?Care Plan : Medication Management  ?Updates made by De Hollingshead, RPH-CPP since 03/18/2022 12:00 AM  ?  ? ?Problem: Diabetes   ?  ? ?Long-Range Goal: A1c <7%   ?Note:   ?Current Barriers:  ?Unable to achieve control of diabetes  ?Unable to tolerate insurance preferred medications ? ?Patient Needs: ?Access to medications ? ?Patient Activities: ?Patient will:  ?- take medications as prescribed as evidenced by patient report and record  review ?check glucose continuously using CGM, document, and provide at future appointments ?collaborate with provider on medication access solutions ? ?  ? ?Problem: Cardiovascular Risk Reduction   ?  ? ?[place]

## 2022-03-19 ENCOUNTER — Telehealth: Payer: Self-pay

## 2022-03-19 ENCOUNTER — Telehealth: Payer: Self-pay | Admitting: Pharmacist

## 2022-03-19 NOTE — Patient Instructions (Signed)
It was great to speak with you today! ? ? ?Visit Information ? ?Crystal Gibson was given information about Medicaid Managed Care team care coordination services as a part of their [place] Medicaid benefit. Crystal Gibson verbally consented to engagement with the [facility] Managed Care team.  ? ?If you are experiencing a medical emergency, please call 911 or report to your local emergency department or urgent care.  ? ?If you have a non-emergency medical problem during routine business hours, please contact your provider's office and ask to speak with a nurse.  ? ?For questions related to your [place] Medicaid health plan, please call: [phone] ? ?If you would like to schedule transportation through your [place] Medicaid plan, please call the following number at least 2 days in advance of your appointment: [phone].  ? There is no limit to the number of trips during the year between medical appointments, healthcare facilities, or pharmacies. Transportation must be scheduled at least 2 business days before but not more than thirty 30 days before of your appointment. ? ? ?[name], PharmD, BCACP, CPP ?Clinical Pharmacist ?Nature conservation officer at [employer] ?[phone] ? ?

## 2022-03-19 NOTE — Patient Outreach (Signed)
Care Coordination ? ?03/19/2022 ? ?LARAH KUNTZMAN ?03-Aug-1971 ?[id number] ? ?Contacted [place] [employer] PBM regarding status of patient's Prior Authorizations. They note that a PA for Novolog pens was approved through 2/28. They note that there is no PA on file for Tresiba, and that Repatha PA is denied due to not meeting coverage criteria.  ? ?Contacted patient's endocrinology office, Dr. Dario Guardian. Spoke with assistant. She noted that they previously encouraged patient to take blood sugar readings on Tresiba, document improvement in glucose in comparison to prior readings on Lantus, and bring those readings to their office to give evidence for PA submission.  ? ?Called patient, reviewed the above. Patient did note confusion, as she thought her PCP had submitted a PA for [place]. I encouraged patient to stick with one provider for medication management (ie endocrinology for diabetes medications, cardiology for lipid medications, etc) to prevent confusion and overlap. She agrees to follow up with endocrinology for medication access needs with insulins. She has follow up with endocrinology on 3/27.  ? ?Will follow up next week.  ? ?[name], PharmD, BCACP ?[facility] ? ?

## 2022-03-19 NOTE — Telephone Encounter (Signed)
? ?  Telephone encounter was:  Successful.  ?03/19/2022 ?Name: [name] MRN: [id number] DOB: 08/24/1971 ? ?Crystal Gibson is a 51 y.o. year old female who is a primary care patient of [name], NP . The community resource team was consulted for assistance with Food Insecurity and food stamps, mortgage ? ?Care guide performed the following interventions: Spoke with patient confirmed her email address and sent information for local food banks and filing for food stamps in [place]. Patient is currently filing for Chapter 13 with the assistance to legal aid. ? ?Follow Up Plan:  No further follow up planned at this time. The patient has been provided with needed resources. ? ?Elizebath Wever, [place], CHC ?Care Guide  Embedded Care Coordination ?[place]  Care Management  ?[address] ?[place], [place] [address] ???[email or web address]  ?? [id number]   ?[email or web address] ?  ?

## 2022-03-21 ENCOUNTER — Other Ambulatory Visit: Payer: Self-pay | Admitting: Obstetrics and Gynecology

## 2022-03-21 ENCOUNTER — Other Ambulatory Visit: Payer: Self-pay

## 2022-03-21 NOTE — Patient Instructions (Signed)
Visit Information ? ?Crystal Gibson was given information about Medicaid Managed Care team care coordination services as a part of their [place] benefit. Crystal Gibson verbally consented to engagement with the [facility] Managed Care team.  ? ?If you are experiencing a medical emergency, please call 911 or report to your local emergency department or urgent care.  ? ?If you have a non-emergency medical problem during routine business hours, please contact your provider's office and ask to speak with a nurse.  ? ?For questions related to your [place] health plan, please call: [phone] ? ?If you would like to schedule transportation through your [place] plan, please call the following number at least 2 days in advance of your appointment: [phone].  ? There is no limit to the number of trips during the year between medical appointments, healthcare facilities, or pharmacies. Transportation must be scheduled at least 2 business days before but not more than thirty 30 days before of your appointment. ? ?Call the Behavioral Health Crisis Line at [phone], at any time, 24 hours a day, 7 days a week. If you are in danger or need immediate medical attention call 911. ? ?If you would like help to quit smoking, call 1-800-QUIT-NOW ((325)107-7421) OR Espa?ol: 1-855-D?jelo-Ya 478-502-6922) o para m?s informaci?n haga clic aqu? or Text READY to 200-400 to register via text ? ?Crystal Gibson - following are the goals we discussed in your visit today:  ?Long-Range Goal: Establish Plan of Care  for Chronic Disease Management Needs   ?Start Date: 02/21/2022  ?Expected End Date: 05/21/2022  ?Priority: High  ?Note:   ?Timeframe:  Long-Range Goal ?Priority:  High ?Start Date:      02/21/22                       ?Expected End Date:   ongoing                   ? ?Follow Up Date 04/20/22 ?  ?- schedule appointment for flu shot ?- schedule appointment for vaccines needed due to my  age or health ?- schedule recommended health tests (blood work, mammogram, colonoscopy, pap test) ?- schedule and keep appointment for annual check-up  ?  ?Why is this important?   ?Screening tests can find diseases early when they are easier to treat.  ?Your doctor or nurse will talk with you about which tests are important for you.  ?Getting shots for common diseases like the flu and shingles will help prevent them. ?03/20/22:  patient seen and evaluated by PCP and CARDS, has appointment with endo on 03/24/22   ? ?The patient verbalized understanding of instructions, educational materials, and care plan provided today and agreed to receive a mailed copy of patient instructions, educational materials, and care plan.  ? ?The Managed Medicaid care management team will reach out to the patient again over the next 30 days.  ?The  Patient has been provided with contact information for the Managed Medicaid care management team and has been advised to call with any health related questions or concerns.  ? ?Kathi Der RN, BSN ?[place]  [facility] ?Care Management Coordinator - Managed Medicaid High Risk ?[phone] ?  ?Following is a copy of your plan of care:  ?Care Plan : RN Care Manager Plan of Care  ?Updates made by Danie Chandler, RN since 03/21/2022 12:00 AM  ?  ? ?Problem: Chronic Disease Management and Care Coordination Needs   ?  Priority: High  ?  ? 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  Remains on 3-5L of Oxygen as needed. ? ?RNCM Clinical Goal(s):  ?Patient will take all medications exactly as prescribed and will call provider for medication related questions as evidenced by patient report ?attend all scheduled medical appointments as evidenced by patient report and patient appointment  ?continue to work with RN Care Manager to address care management and care coordination needs related to  DMII and post COVID as evidenced by adherence to CM Team Scheduled appointments ?work with pharmacist to address Medication procurement related toDMII as evidenced by review or EMR and patient or pharmacist report ?work with community resource care guide to address needs related to  Financial constraints related to food and mortgage as evidenced by patient and/or community resource care guide support through collaboration with Medical illustrator, provider, and care team.  ? ?Interventions: ?Inter-disciplinary care team collaboration (see longitudinal plan of care) ?Evaluation of current treatment plan related to  self management and patient's adherence to plan as established by provider ?Collaborated with Pharmacy and [employer] for medication, food resources, and mortgage assistance-completed ?Follow Up with [facility] regarding insulin-completed ? ?Diabetes Interventions:  (Status:  New goal.) Long Term Goal ?Assessed patient's understanding of A1c goal: <7% ?Discussed plans with patient for ongoing care management follow up and provided patient with direct contact information for care management team ?Reviewed scheduled/upcoming provider appointments including ?Referral made to pharmacy team for assistance with medications ?Referral made to community resources care guide team for assistance with food resources and mortgage  assistance ?Review of patient status, including review of consultants reports, relevant laboratory and other test results, and medications completed ?Assessed social determinant of health barriers ?Lab Results  ?Component Value Date  ? HGBA1C 10.0 (H) 02/11/2022  ? ?Patient Goals/Self-Care Activities: ?Take all medications as prescribed ?Attend all scheduled provider appointments ?Call pharmacy for medication refills 3-7 days in advance of running out of medications ?Perform all self care activities independently  ?Perform IADL's (shopping, preparing meals, housekeeping, managing finances) independently ?Call provider office for new concerns or questions  ?Patient has contacted [facility] Medicaid Ombudsman for assistance with medication procurement ? ?Follow Up Plan:  The patient has been provided with contact information for the care management team and has been advised to call with any health related questions or concerns.  ?The care management team will reach out to the patient again over the next 30 days.   ? ?  ?

## 2022-03-21 NOTE — Patient Outreach (Signed)
?Medicaid Managed Care   ?Nurse Care Manager Note ? ?03/21/2022 ?Name:  Crystal Gibson MRN:  [id number] DOB:  06/06/1971 ? ?Crystal Gibson is an 51 y.o. year old female who is a primary patient of Crystal Gibson, Crystal Rowan, NP.  The [facility] Managed Care Coordination team was consulted for assistance with:    ?Chronic healthcare management needs, OSA, DM ? ?Crystal Gibson was given information about Medicaid Managed Care Coordination team services today. Crystal Gibson Patient agreed to services and verbal consent obtained. ? ?Engaged with patient by telephone for follow up visit in response to provider referral for case management and/or care coordination services.  ? ?Assessments/Interventions:  Review of past medical history, allergies, medications, health status, including review of consultants reports, laboratory and other test data, was performed as part of comprehensive evaluation and provision of chronic care management services. ? ?SDOH (Social Determinants of Health) assessments and interventions performed: ?SDOH Interventions   ? ?Flowsheet Row Most Recent Value  ?SDOH Interventions   ?Food Insecurity Interventions Other (Comment)  [care guide referral made and resources provided]  ? ?  ?Care Plan ? ?Allergies  ?Allergen Reactions  ? Glucophage [Metformin] Nausea And Vomiting  ? ? ?Medications Reviewed Today   ? ? Reviewed by Gayla Medicus, RN (Registered Nurse) on 03/21/22 at [place] List Status: <None>  ? ?Medication Order Taking? Sig Documenting Provider Last Dose Status Informant  ?ACCU-CHEK FASTCLIX LANCETS MISC [id number] No  [provider] Taking Active Self  ?albuterol (PROVENTIL HFA;VENTOLIN HFA) 108 (90 BASE) MCG/ACT inhaler [id number] No Inhale 2 puffs into the lungs every 4 (four) hours as needed for wheezing or shortness of breath. Cuthriell, [name], PA-C Taking Active Self  ?ARIPiprazole (ABILIFY) 15 MG tablet [id number]  Take 15 mg by mouth daily. [provider]  Active   ?baclofen  (LIORESAL) 10 MG tablet [id number] No Take 10 mg by mouth 3 (three) times daily. [provider] Taking Active Self  ?BD PEN NEEDLE NANO 2ND GEN 32G X 4 MM MISC [id number] No USE THREE TIMES DAILY BEFORE MEALS [provider] Taking Active Self  ?Blood Glucose Monitoring Suppl (ACCU-CHEK GUIDE) w/Device KIT [id number] No USE DEVICE TO CHECK SUGARS DAILY [provider] Taking Active Self  ?Butalbital-APAP-Caffeine 50-300-40 MG CAPS [id number]   [provider]  Active   ?Continuous Blood Gluc Sensor (DEXCOM G6 SENSOR) MISC [id number] No Apply 1 each topically as needed. [provider] Taking Active   ?DULoxetine (CYMBALTA) 60 MG capsule [id number] No Take 60 mg by mouth daily. [provider] Taking Active Self  ?Evolocumab (REPATHA SURECLICK) 563 MG/ML SOAJ [id number] No Inject 140 mg into the skin every 14 (fourteen) days. [provider] Taking Active   ?famotidine (PEPCID) 20 MG tablet [id number]  Take 20 mg by mouth daily. [provider]  Active   ?FARXIGA 10 MG TABS tablet [id number] No Take 10 mg by mouth daily. [provider] Taking Active Self  ?Fluocinolone Acetonide Scalp 0.01 % OIL [id number] No Apply 1 application topically every 7 (seven) days. [provider] Taking Active Self  ?fluticasone (FLONASE) 50 MCG/ACT nasal spray [id number] No Place 1 spray into both nostrils 2 (two) times daily. Cuthriell, [name], PA-C Taking Active Self  ?furosemide (LASIX) 20 MG tablet [id number] No Take 20 mg by mouth daily. [provider] Taking Active Self  ?gabapentin (NEURONTIN) 300 MG capsule [id number] No Take 1 capsule (300 mg total) by mouth 3 (three) times daily. Ila Mcgill  S, DPM Taking Active Self  ?glucose blood (ACCU-CHEK GUIDE) test strip [id number] No Accu-Chek Guide In Vitro Strip QTY: 200 strip Days: 90 Refills: 1  Written: 04/27/20 Patient Instructions: TEST FASTING BLOOD SUGAR EVERY MORNING FASTING AND  EVERY EVENING E11.65 [provider] Taking Active Self  ?glucose blood ([employer] BLOOD GLUCOSE TEST) test strip [id number] No Use to check blood sugar as directed with insulin 3 times a day & for symptoms of high or low blood sugar. [provider] Taking Active Self  ?icosapent Ethyl (VASCEPA) 1 g capsule [id number] No Take 2 g by mouth 2 (two) times daily.  ?Patient not taking: Reported on 03/18/2022  ? [provider] Not Taking Active Self  ?insulin degludec (TRESIBA FLEXTOUCH) 100 UNIT/ML FlexTouch Pen [id number] No Inject 35 Units into the skin daily.  ?Patient not taking: Reported on 03/18/2022  ? [provider] Not Taking Active Self  ?Insulin Pen Needle (GLOBAL EASY GLIDE PEN NEEDLES) 32G X 4 MM MISC [id number] No Use for injections three (3) times a day before meals. [provider] Taking Active Self  ?Lancets Misc. (ACCU-CHEK FASTCLIX LANCET) KIT [id number] No Accu-Chek Fastclix Lancet Drum ? USE TO CHECK FASTING IN AM AND AS NEEDED FOR HIGH OR LOW [provider] Taking Active Self  ?metoprolol succinate (TOPROL-XL) 100 MG 24 hr tablet [id number] No Take 100 mg by mouth. [provider] Taking Active Self  ?NEXLIZET 180-10 MG TABS [id number] No Take 1 tablet by mouth daily.  ?Patient not taking: Reported on 03/18/2022  ? [provider] Not Taking Active Self  ?NOVOLOG FLEXPEN 100 UNIT/ML FlexPen [id number] No SMARTSIG:10 Unit(s) SUB-Q  ?Patient not taking: Reported on 03/18/2022  ? [provider] Not Taking Active   ?olmesartan (BENICAR) 5 MG tablet [id number] No Take 5 mg by mouth daily. [provider] Taking Active Self  ?ONETOUCH VERIO test strip [id number] No 1 each 4 (four) times daily. [provider] Taking Active Self  ?OXYGEN [id number] No Inhale 2 L into the lungs as needed (exertion). [provider] Taking Active Self  ?pantoprazole (PROTONIX) 40 MG tablet [id number] No Take 40 mg by mouth daily.  [provider] Taking Active   ?rosuvastatin (CRESTOR) 40 MG tablet [id number] No Take 40 mg by mouth daily. [provider] Taking Active Self  ?Semaglutide (RYBELSUS) 7 MG TABS [id number] No Take 1 tablet by mouth daily. [provider] Taking Active   ?SYMBICORT 160-4.5 MCG/ACT inhaler [id number] No Inhale 2 puffs into the lungs in the morning and at bedtime. [provider] Taking Active Self  ?topiramate (TOPAMAX) 25 MG tablet [id number] No Take 50 mg by mouth at bedtime. [provider] Taking Active   ?traZODone (DESYREL) 100 MG tablet [id number] No Take 200 mg by mouth at bedtime.  [provider] Taking Active Self  ? ?  ?  ? ?  ? ?Patient Active Problem List  ? Diagnosis Date Noted  ? 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 Chronic respiratory failure with hypoxia ([place]) 03/06/2021  ? Multiple subsegmental pulmonary emboli without acute cor pulmonale ([place]) 03/06/2021  ? OSA (obstructive sleep apnea) 03/06/2021  ? Sesamoiditis of foot 11/05/2020  ? Ganglion cyst of tendon sheath of left hand 11/03/2018  ? 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 Lumbar sprain 11/03/2018  ? Sprain of ligament of tarsometatarsal joint 04/17/2016  ? Health examination of defined subpopulation 09/14/2014  ? ?Conditions to be addressed/monitored per PCP order:  Chronic healthcare management needs, OSA, DM, S/P COVID ? ?Care Plan : RN Care Manager Plan of Care  ?Updates made by Gayla Medicus, RN since 03/21/2022 12:00 AM  ?  ? ?Problem: Chronic Disease Management and Care Coordination Needs   ?Priority: High  ?  ? 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

## 2022-03-26 ENCOUNTER — Telehealth: Payer: Self-pay | Admitting: Pharmacist

## 2022-03-26 NOTE — Patient Outreach (Signed)
? ? ? ?  03/26/2022 ?Name: Crystal Gibson MRN: [id number] DOB: June 02, 1971 ? ? ?Attempted to contact patient for follow up for medication management support. Unable to leave HIPAA compliant message for patient. Will attempt outreach again later this week.  ? ?Catie Darnelle Maffucci, PharmD, BCACP ?[facility] ? ? ? ?

## 2022-03-28 ENCOUNTER — Ambulatory Visit: Payer: Self-pay

## 2022-03-28 DIAGNOSIS — U099 Post covid-19 condition, unspecified: Secondary | ICD-10-CM | POA: Diagnosis not present

## 2022-03-28 DIAGNOSIS — R41841 Cognitive communication deficit: Secondary | ICD-10-CM | POA: Diagnosis not present

## 2022-04-01 ENCOUNTER — Telehealth: Payer: Self-pay | Admitting: Pharmacist

## 2022-04-01 NOTE — Patient Outreach (Signed)
? ? ? ?  04/01/2022 ?Name: Crystal Gibson MRN: [id number] DOB: May 15, 1971 ? ? ?Attempted to contact patient for follow up for medication management support. Left HIPAA compliant message for patient to return my call at their convenience.   ? ? ?Catie Eppie Gibson, PharmD, BCACP ?[facility] ? ? ?

## 2022-04-03 DIAGNOSIS — R0600 Dyspnea, unspecified: Secondary | ICD-10-CM | POA: Diagnosis not present

## 2022-04-04 DIAGNOSIS — U099 Post covid-19 condition, unspecified: Secondary | ICD-10-CM | POA: Diagnosis not present

## 2022-04-04 DIAGNOSIS — R41841 Cognitive communication deficit: Secondary | ICD-10-CM | POA: Diagnosis not present

## 2022-04-07 ENCOUNTER — Telehealth: Payer: Self-pay | Admitting: Plastic Surgery

## 2022-04-08 ENCOUNTER — Telehealth: Payer: Self-pay | Admitting: Plastic Surgery

## 2022-04-08 NOTE — Telephone Encounter (Signed)
Returned patient's call. Patient wanted to know about scheduling surgery. LVM advising that patient needs to schedule a follow up appt with Dr. Domenica Reamer to make sure all clearances have been completed before rescheduling her surgery.  ?

## 2022-04-09 ENCOUNTER — Ambulatory Visit (INDEPENDENT_AMBULATORY_CARE_PROVIDER_SITE_OTHER): Payer: Medicaid Other | Admitting: Plastic Surgery

## 2022-04-09 VITALS — Wt 232.0 lb

## 2022-04-09 DIAGNOSIS — R0602 Shortness of breath: Secondary | ICD-10-CM | POA: Diagnosis not present

## 2022-04-09 DIAGNOSIS — E119 Type 2 diabetes mellitus without complications: Secondary | ICD-10-CM | POA: Diagnosis not present

## 2022-04-09 DIAGNOSIS — Z6832 Body mass index (BMI) 32.0-32.9, adult: Secondary | ICD-10-CM | POA: Diagnosis not present

## 2022-04-09 NOTE — Progress Notes (Deleted)
Need updated A1C after taking correct medication. ?

## 2022-04-09 NOTE — Progress Notes (Signed)
? ?  Referring Provider ?Fayrene Helper, NP ?[address] ?[place],  [place] [address]  ? ?CC:  ?Abdominal pannus ?Crystal Gibson is an 51 y.o. female.  ? ?HPI: Patient is a 51 year old with abdominal pannus.  She has a complicated past medical history and that she is a COVID long-hauler and also has a history of pulmonary embolus.  She intermittently uses home oxygen.  She had a recent stress echo that showed an EF in the 60s and we will have this scanned.  She is seeing her pulmonary doctor today and will get evaluated about whether any additional measures need to be taken prior to rescheduling her surgery.  Her hemoglobin A1c is also been high.  Her most recent test was in February and we will recheck that. ? ?Review of Systems ?General: alert, NAD. ? ?Physical Exam ? ?  02/11/2022  ?  9:56 AM 01/31/2022  ?  2:52 PM 01/13/2022  ?  8:43 AM  ?Vitals with BMI  ?Height 5\' 9"  5\' 9"  5\' 9"   ?Weight 222 lbs 11 oz 225 lbs 226 lbs 13 oz  ?BMI 32.87 33.21 33.48  ?Systolic 133 129  ?Diastolic 86 79 78  ?Pulse 103 94 91  ?  ?General:  No acute distress,  Alert and oriented, Non-Toxic, Normal speech and affect ?Abdomen: Abdominal pannus no evidence of hernias. ? ?Assessment/Plan ?Alert or collecting patient's pulmonary records prior to surgery, rechecking her hemoglobin A1c and we have reviewed her echo.  After rechecking her hemoglobin A1c and reviewing her pulmonary recommendations we will consider rescheduling her panniculectomy. ? ? ?04/09/2022, 11:33 AM  ? ? ?    ?

## 2022-04-10 ENCOUNTER — Other Ambulatory Visit: Payer: Self-pay | Admitting: Pharmacist

## 2022-04-10 ENCOUNTER — Encounter: Payer: Self-pay | Admitting: Physician Assistant

## 2022-04-10 NOTE — Patient Instructions (Signed)
It was great to speak with you today! ? ? ?For a goal A1c of less than 7%, the goal Time in Range is greater than 70%.  ? ?When you next see your PCP or your endocrinologist, I recommend you ask about increasing the Rybelsus dose. This will allow to help reduce how much insulin is needed.  ? ?Take care! ? ?Catie Eppie Gibson, PharmD, BCACP ?[facility] ?[phone] ? ? ? ? ?Visit Information ? ?Ms. Hildebrant was given information about Medicaid Managed Care team care coordination services as a part of their [place] benefit. [name] verbally consented to engagement with the [facility] Managed Care team.  ? ?If you are experiencing a medical emergency, please call 911 or report to your local emergency department or urgent care.  ? ?If you have a non-emergency medical problem during routine business hours, please contact your provider's office and ask to speak with a nurse.  ? ?For questions related to your [place] health plan, please call: [phone] ? ?If you would like to schedule transportation through your [place] plan, please call the following number at least 2 days in advance of your appointment: [phone].  ? There is no limit to the number of trips during the year between medical appointments, healthcare facilities, or pharmacies. Transportation must be scheduled at least 2 business days before but not more than thirty 30 days before of your appointment. ? ? ?

## 2022-04-10 NOTE — Progress Notes (Signed)
Surgical clearance obtained by Marisue Ivan, FNP cardiology.  Hold patient's Eliquis 3 days prior to surgery, resume day after surgery assuming bleeding is controlled. ?

## 2022-04-10 NOTE — Patient Outreach (Signed)
? ?   ? ?Chief Complaint  ?Patient presents with  ? High Risk Managed Medicaid  ? ? ?Crystal Gibson is a 51 y.o. year old female who was referred for medication management by their primary care provider, Crystal Berry, NP. They presented for a telephone visit in the context of the COVID-19 pandemic. ?  ?They were referred to the pharmacist by their High Risk Managed Medicaid Care Team  for assistance in managing diabetes and hypertension.  ? ?Subjective: ? ?Care Team: ?Primary Care Provider: [name], NP ; Next Scheduled Visit: end of this month, she thinks 4/24 ?Endocrinologist Dr. Rosario Jacks; Next Scheduled Visit: beginning of May ?Pulmonologist  ? ?Medication Access/Adherence ? ?Current Pharmacy:  ?[id number] [address], [address] ?[address] ?[place] [address] ?Phone: [phone] Fax: [phone] ? ?[facility] [place], [address] [address] ?[address] ?[place] [address] ?Phone: [phone] Fax: [phone] ? ? ?Patient reports affordability concerns with their medications: No  ?Patient reports access/transportation concerns to their pharmacy: No  ?Patient reports adherence concerns with their medications:  No   ? ?Diabetes: ? ?Current medications: Rybelsus 7 mg daily, Farxiga 10 mg daily, Tresiba U300 45 units daily, Novolog 10 units with meals  ?Medications tried in the past: metformin (intolerance); Humalog (no benefit); Lantus (no benefit) ? ?Current glucose readings: using DexCom CGM; reports fastings now in low to mid 100s; 2 hour post prandials improved in the low 200s ? ?Reports plans for upcoming panniculectomy. Plans to get lab work tomorrow.  ? ?Hyperlipidemia/ASCVD Risk Reduction ? ?Current lipid lowering medications: rosuvastatin 40 mg daily, Repatha 140 mg every 14 days ? ? ?Objective: ?Lab Results  ?Component Value Date  ? HGBA1C 10.0 (Gibson) 02/11/2022  ? ? ?Lab  Results  ?Component Value Date  ? CREATININE 0.77 02/11/2022  ? BUN 13 02/11/2022  ? NA 137 02/11/2022  ? K 4.1 02/11/2022  ? CL 100 02/11/2022  ? CO2 28 02/11/2022  ? ? ? ?Medications Reviewed Today   ? ? Reviewed by Osker Mason, RPH-CPP (Pharmacist) on 04/10/22 at 1115  Med List Status: <None>  ? ?Medication Order Taking? Sig Documenting Provider Last Dose Status Informant  ?ACCU-CHEK FASTCLIX LANCETS [place] [id number]   [provider]  Active Self  ?albuterol (PROVENTIL HFA;VENTOLIN HFA) 108 (90 BASE) MCG/ACT inhaler [id number]  Inhale 2 puffs into the lungs every 4 (four) hours as needed for wheezing or shortness of breath. Cuthriell, [name], PA-C  Active Self  ?ARIPiprazole (ABILIFY) 15 MG tablet [id number] Yes Take 15 mg by mouth daily. [provider] Taking Active   ?baclofen (LIORESAL) 10 MG tablet [id number] Yes Take 10 mg by mouth 3 (three) times daily. [provider] Taking Active Self  ?BD PEN NEEDLE NANO 2ND GEN 32G X 4 MM MISC [id number]  USE THREE TIMES DAILY BEFORE MEALS [provider]  Active Self  ?Blood Glucose Monitoring Suppl (ACCU-CHEK GUIDE) w/Device KIT [id number]  USE DEVICE TO CHECK SUGARS DAILY [provider]  Active Self  ?Butalbital-APAP-Caffeine 50-300-40 MG CAPS [id number]   [provider]  Active   ?Continuous Blood Gluc Sensor (DEXCOM G6 SENSOR) MISC [id number] Yes Apply 1 each topically as needed. [provider] Taking Active   ?DULoxetine (CYMBALTA) 60 MG capsule [id number] Yes Take 60 mg by mouth daily. [provider] Taking Active Self  ?  Evolocumab (REPATHA SURECLICK) 981 MG/ML SOAJ [id number] Yes Inject 140 mg into the skin every 14 (fourteen) days. [provider] Taking Active   ?famotidine (PEPCID) 20 MG tablet [id number]  Take 20 mg by mouth daily. [provider]  Active   ?FARXIGA 10 MG TABS tablet [id number] Yes Take 10 mg by mouth daily. [provider] Taking Active  Self  ?Fluocinolone Acetonide Scalp 0.01 % OIL [id number]  Apply 1 application topically every 7 (seven) days. [provider]  Active Self  ?fluticasone (FLONASE) 50 MCG/ACT nasal spray [id number] Yes Place 1 spray into both nostrils 2 (two) times daily. Cuthriell, [name], PA-C Taking Active Self  ?furosemide (LASIX) 20 MG tablet [id number] Yes Take 20 mg by mouth daily. [provider] Taking Active Self  ?gabapentin (NEURONTIN) 300 MG capsule [id number] Yes Take 1 capsule (300 mg total) by mouth 3 (three) times daily. [name], DPM Taking Active Self  ?glucose blood (ACCU-CHEK GUIDE) test strip [id number] Yes Accu-Chek Guide In Vitro Strip QTY: 200 strip Days: 90 Refills: 1  Written: 04/27/20 Patient Instructions: TEST FASTING BLOOD SUGAR EVERY MORNING FASTING AND EVERY EVENING E11.65 [provider] Taking Active Self  ?glucose blood ([employer] BLOOD GLUCOSE TEST) test strip [id number] Yes Use to check blood sugar as directed with insulin 3 times a day & for symptoms of high or low blood sugar. [provider] Taking Active Self  ?insulin degludec (TRESIBA) 200 UNIT/ML FlexTouch Pen [id number] Yes Inject 45 Units into the skin daily. [provider] Taking Active Self  ?Insulin Pen Needle (GLOBAL EASY GLIDE PEN NEEDLES) 32G X 4 MM MISC [id number] Yes Use for injections three (3) times a day before meals. [provider] Taking Active Self  ?Lancets Misc. (ACCU-CHEK FASTCLIX LANCET) KIT [id number] Yes Accu-Chek Fastclix Lancet Drum ? USE TO CHECK FASTING IN AM AND AS NEEDED FOR HIGH OR LOW [provider] Taking Active Self  ?metoprolol succinate (TOPROL-XL) 100 MG 24 hr tablet [id number] Yes Take 100 mg by mouth. [provider] Taking Active Self  ?NOVOLOG FLEXPEN 100 UNIT/ML FlexPen [id number] Yes Inject 10 Units into the skin 3 (three) times daily with meals. [provider] Taking Active   ?olmesartan (BENICAR) 5 MG tablet  [id number] Yes Take 5 mg by mouth daily. [provider] Taking Active Self  ?ONETOUCH VERIO test strip [id number] Yes 1 each 4 (four) times daily. [provider] Taking Active Self  ?OXYGEN [id number] Yes Inhale 2 L into the lungs as needed (exertion). [provider] Taking Active Self  ?pantoprazole (PROTONIX) 40 MG tablet [id number] Yes Take 40 mg by mouth daily. [provider] Taking Active   ?rosuvastatin (CRESTOR) 40 MG tablet [id number] Yes Take 40 mg by mouth daily. [provider] Taking Active Self  ?Semaglutide (RYBELSUS) 7 MG TABS [id number] Yes Take 1 tablet by mouth daily. [provider] Taking Active   ?SYMBICORT 160-4.5 MCG/ACT inhaler [id number] Yes Inhale 2 puffs into the lungs in the morning and at bedtime. [provider] Taking Active Self  ?topiramate (TOPAMAX) 25 MG tablet [id number] Yes Take 50 mg by mouth at bedtime. [provider] Taking Active   ?traZODone (DESYREL) 100 MG tablet [id number] Yes Take 200 mg by mouth at bedtime.  [provider] Taking Active Self  ? ?  ?  ? ?  ? ? ?Assessment/Plan:  ?Care Plan : Medication Management  ?Updates made by Osker Mason, RPH-CPP since 04/10/2022 12:00 AM  ?  ? ?  Problem: Diabetes   ?  ? ?Long-Range Goal: A1c <7%   ?Note:   ?Current Barriers:  ?Unable to achieve control of diabetes  ?Unable to tolerate insurance preferred medications ? ?Patient Needs: ?Access to medications ? ?Patient Activities: ?Patient will:  ?- take medications as prescribed as evidenced by patient report and record review ?check glucose continuously using CGM, document, and provide at future appointments ?collaborate with provider on medication access solutions ? ? ?  ? ?Problem: Cardiovascular Risk Reduction   ?  ? ?Long-Range Goal: LDL <70   ?Note:   ?Current Barriers:  ?Unable to achieve control of LDL  ? ?Patient Needs: ?Medication access ? ?Patient Activities: ?Patient will:  ?- take  medications as prescribed as evidenced by patient report and record review ?collaborate with provider on medication access solutions ? ? ?  ? ? ?Diabetes: ?- Currently uncontrolled but improving ?- Reviewed goal Time I

## 2022-04-11 DIAGNOSIS — U099 Post covid-19 condition, unspecified: Secondary | ICD-10-CM | POA: Diagnosis not present

## 2022-04-11 DIAGNOSIS — R41841 Cognitive communication deficit: Secondary | ICD-10-CM | POA: Diagnosis not present

## 2022-04-14 DIAGNOSIS — G4733 Obstructive sleep apnea (adult) (pediatric): Secondary | ICD-10-CM | POA: Diagnosis not present

## 2022-04-16 NOTE — Telephone Encounter (Signed)
Patient seen 04/09/22 for follow-up. ?

## 2022-04-28 ENCOUNTER — Other Ambulatory Visit: Payer: Self-pay | Admitting: Obstetrics and Gynecology

## 2022-04-28 NOTE — Patient Outreach (Signed)
Care Coordination ? ?04/28/2022 ? ?[name] ?1971-08-19 ?[id number] ? ? ? ?Medicaid Managed Care  ? ?Unsuccessful Outreach Note ? ?04/28/2022 ?Name: Crystal Gibson MRN: [id number] DOB: [birth year] ? ?Referred by: Fayrene Helper, NP ?Reason for referral : High Risk Managed Medicaid (Unsuccessful telephone outreach) ? ? ?An unsuccessful telephone outreach was attempted today. The patient was referred to the case management team for assistance with care management and care coordination.  ? ?Follow Up Plan: The care management team will reach out to the patient again over the next 30 business days.  ? ?Kathi Der RN, BSN ?[place]  [facility] ?Care Management Coordinator - Managed Medicaid High Risk ?[phone] ?  ? ?

## 2022-04-28 NOTE — Patient Instructions (Signed)
Visit Information ? ?Hi Ms. Crystal Gibson, I am sorry I missed you today-I hope you are doing okay- as a part of your Medicaid benefit, you are eligible for care management and care coordination services at no cost or copay. I was unable to reach you by phone today but would be happy to help you with your health related needs. Please feel free to call me at [phone] ? ?A member of the Managed Medicaid care management team will reach out to you again over the next 30 business  days.  ? ?Kathi Der RN, BSN ?[place]  [facility] ?Care Management Coordinator - Managed Medicaid High Risk ?[phone] ?  ?

## 2022-05-12 DIAGNOSIS — G4730 Sleep apnea, unspecified: Secondary | ICD-10-CM | POA: Diagnosis not present

## 2022-05-12 DIAGNOSIS — E119 Type 2 diabetes mellitus without complications: Secondary | ICD-10-CM | POA: Diagnosis not present

## 2022-05-12 DIAGNOSIS — I471 Supraventricular tachycardia: Secondary | ICD-10-CM | POA: Diagnosis not present

## 2022-05-12 DIAGNOSIS — E785 Hyperlipidemia, unspecified: Secondary | ICD-10-CM | POA: Diagnosis not present

## 2022-05-12 DIAGNOSIS — E669 Obesity, unspecified: Secondary | ICD-10-CM | POA: Diagnosis not present

## 2022-05-12 DIAGNOSIS — I10 Essential (primary) hypertension: Secondary | ICD-10-CM | POA: Diagnosis not present

## 2022-05-14 DIAGNOSIS — G4733 Obstructive sleep apnea (adult) (pediatric): Secondary | ICD-10-CM | POA: Diagnosis not present

## 2022-05-27 ENCOUNTER — Other Ambulatory Visit: Payer: Medicaid Other

## 2022-05-28 ENCOUNTER — Other Ambulatory Visit: Payer: Self-pay | Admitting: Obstetrics and Gynecology

## 2022-05-28 NOTE — Patient Instructions (Signed)
  Hi Ms. Beth am sorry I could not reach you today-I hope you are doing okay - as a part of your Medicaid benefit, you are eligible for care management and care coordination services at no cost or copay. I was unable to reach you by phone today but would be happy to help you with your health related needs. Please feel free to call me at [phone]  A member of the Managed Medicaid care management team will reach out to you again over the next 30 days.   Kathi Der RN, BSN [place]  [facility] [employer], production - Managed Medicaid High Risk [phone]

## 2022-05-28 NOTE — Patient Outreach (Signed)
Care Coordination  05/28/2022  SKARLETH BALLEJOS Sep 28, 1971 [id number]   Medicaid Managed Care   Unsuccessful Outreach Note  05/28/2022 Name: Crystal Gibson MRN: [id number] DOB: 1971/08/08  Referred by: [name], NP Reason for referral : High Risk Managed Medicaid (Chronic case management follow up)   A second unsuccessful telephone outreach was attempted today. The patient was referred to the case management team for assistance with care management and care coordination.   Follow Up Plan: The care management team will reach out to the patient again over the next 30 days.   Aida Raider RN, BSN   [facility] Cur[employer] - Managed Medicaid High Risk [phone]

## 2022-05-31 DIAGNOSIS — Z8616 Personal history of COVID-19: Secondary | ICD-10-CM | POA: Diagnosis not present

## 2022-05-31 DIAGNOSIS — Z6832 Body mass index (BMI) 32.0-32.9, adult: Secondary | ICD-10-CM | POA: Diagnosis not present

## 2022-05-31 DIAGNOSIS — Z888 Allergy status to other drugs, medicaments and biological substances status: Secondary | ICD-10-CM | POA: Diagnosis not present

## 2022-05-31 DIAGNOSIS — Z794 Long term (current) use of insulin: Secondary | ICD-10-CM | POA: Diagnosis not present

## 2022-05-31 DIAGNOSIS — F32A Depression, unspecified: Secondary | ICD-10-CM | POA: Diagnosis not present

## 2022-05-31 DIAGNOSIS — Z79899 Other long term (current) drug therapy: Secondary | ICD-10-CM | POA: Diagnosis not present

## 2022-05-31 DIAGNOSIS — Z20822 Contact with and (suspected) exposure to covid-19: Secondary | ICD-10-CM | POA: Diagnosis not present

## 2022-05-31 DIAGNOSIS — R079 Chest pain, unspecified: Secondary | ICD-10-CM | POA: Diagnosis not present

## 2022-05-31 DIAGNOSIS — R059 Cough, unspecified: Secondary | ICD-10-CM | POA: Diagnosis not present

## 2022-05-31 DIAGNOSIS — R0602 Shortness of breath: Secondary | ICD-10-CM | POA: Diagnosis not present

## 2022-05-31 DIAGNOSIS — R1110 Vomiting, unspecified: Secondary | ICD-10-CM | POA: Diagnosis not present

## 2022-05-31 DIAGNOSIS — M25551 Pain in right hip: Secondary | ICD-10-CM | POA: Diagnosis not present

## 2022-05-31 DIAGNOSIS — R0789 Other chest pain: Secondary | ICD-10-CM | POA: Diagnosis not present

## 2022-05-31 DIAGNOSIS — E119 Type 2 diabetes mellitus without complications: Secondary | ICD-10-CM | POA: Diagnosis not present

## 2022-06-01 DIAGNOSIS — R079 Chest pain, unspecified: Secondary | ICD-10-CM | POA: Diagnosis not present

## 2022-06-01 DIAGNOSIS — M25551 Pain in right hip: Secondary | ICD-10-CM | POA: Diagnosis not present

## 2022-06-14 DIAGNOSIS — G4733 Obstructive sleep apnea (adult) (pediatric): Secondary | ICD-10-CM | POA: Diagnosis not present

## 2022-06-19 DIAGNOSIS — G4730 Sleep apnea, unspecified: Secondary | ICD-10-CM | POA: Diagnosis not present

## 2022-06-19 DIAGNOSIS — E669 Obesity, unspecified: Secondary | ICD-10-CM | POA: Diagnosis not present

## 2022-06-19 DIAGNOSIS — E785 Hyperlipidemia, unspecified: Secondary | ICD-10-CM | POA: Diagnosis not present

## 2022-06-19 DIAGNOSIS — D709 Neutropenia, unspecified: Secondary | ICD-10-CM | POA: Diagnosis not present

## 2022-06-19 DIAGNOSIS — R079 Chest pain, unspecified: Secondary | ICD-10-CM | POA: Diagnosis not present

## 2022-06-19 DIAGNOSIS — E559 Vitamin D deficiency, unspecified: Secondary | ICD-10-CM | POA: Diagnosis not present

## 2022-06-19 DIAGNOSIS — I10 Essential (primary) hypertension: Secondary | ICD-10-CM | POA: Diagnosis not present

## 2022-06-19 DIAGNOSIS — I471 Supraventricular tachycardia: Secondary | ICD-10-CM | POA: Diagnosis not present

## 2022-06-19 DIAGNOSIS — E119 Type 2 diabetes mellitus without complications: Secondary | ICD-10-CM | POA: Diagnosis not present

## 2022-06-20 DIAGNOSIS — E785 Hyperlipidemia, unspecified: Secondary | ICD-10-CM | POA: Diagnosis not present

## 2022-06-20 DIAGNOSIS — E6609 Other obesity due to excess calories: Secondary | ICD-10-CM | POA: Diagnosis not present

## 2022-06-20 DIAGNOSIS — R5383 Other fatigue: Secondary | ICD-10-CM | POA: Diagnosis not present

## 2022-06-20 DIAGNOSIS — R0789 Other chest pain: Secondary | ICD-10-CM | POA: Diagnosis not present

## 2022-06-20 DIAGNOSIS — E1165 Type 2 diabetes mellitus with hyperglycemia: Secondary | ICD-10-CM | POA: Diagnosis not present

## 2022-06-20 DIAGNOSIS — I10 Essential (primary) hypertension: Secondary | ICD-10-CM | POA: Diagnosis not present

## 2022-06-24 ENCOUNTER — Other Ambulatory Visit: Payer: Self-pay | Admitting: Obstetrics and Gynecology

## 2022-06-25 ENCOUNTER — Ambulatory Visit: Payer: Medicaid Other

## 2022-06-26 ENCOUNTER — Ambulatory Visit: Payer: Self-pay

## 2022-06-26 ENCOUNTER — Telehealth: Payer: Self-pay | Admitting: Pharmacist

## 2022-06-26 NOTE — Telephone Encounter (Signed)
Attempted to outreach patient for scheduled appointment. Left voicemail for her to return my call at her convenience.   If I do not hear back from her by end of business, will attempt outreach next week  Catie TClearance Coots, PharmD, [facility] [phone].

## 2022-07-09 DIAGNOSIS — R0609 Other forms of dyspnea: Secondary | ICD-10-CM | POA: Diagnosis not present

## 2022-07-09 DIAGNOSIS — G4733 Obstructive sleep apnea (adult) (pediatric): Secondary | ICD-10-CM | POA: Diagnosis not present

## 2022-07-09 DIAGNOSIS — R0602 Shortness of breath: Secondary | ICD-10-CM | POA: Diagnosis not present

## 2022-07-09 DIAGNOSIS — Z6833 Body mass index (BMI) 33.0-33.9, adult: Secondary | ICD-10-CM | POA: Diagnosis not present

## 2022-07-09 DIAGNOSIS — R531 Weakness: Secondary | ICD-10-CM | POA: Diagnosis not present

## 2022-07-09 DIAGNOSIS — Z86711 Personal history of pulmonary embolism: Secondary | ICD-10-CM | POA: Diagnosis not present

## 2022-07-13 DIAGNOSIS — R030 Elevated blood-pressure reading, without diagnosis of hypertension: Secondary | ICD-10-CM | POA: Diagnosis not present

## 2022-07-13 DIAGNOSIS — R531 Weakness: Secondary | ICD-10-CM | POA: Diagnosis not present

## 2022-07-13 DIAGNOSIS — E669 Obesity, unspecified: Secondary | ICD-10-CM | POA: Diagnosis not present

## 2022-07-13 DIAGNOSIS — W19XXXA Unspecified fall, initial encounter: Secondary | ICD-10-CM | POA: Diagnosis not present

## 2022-07-13 DIAGNOSIS — S6990XA Unspecified injury of unspecified wrist, hand and finger(s), initial encounter: Secondary | ICD-10-CM | POA: Diagnosis not present

## 2022-07-13 DIAGNOSIS — M79641 Pain in right hand: Secondary | ICD-10-CM | POA: Diagnosis not present

## 2022-07-13 DIAGNOSIS — S63641A Sprain of metacarpophalangeal joint of right thumb, initial encounter: Secondary | ICD-10-CM | POA: Diagnosis not present

## 2022-07-14 DIAGNOSIS — G4733 Obstructive sleep apnea (adult) (pediatric): Secondary | ICD-10-CM | POA: Diagnosis not present

## 2022-07-22 DIAGNOSIS — E119 Type 2 diabetes mellitus without complications: Secondary | ICD-10-CM | POA: Diagnosis not present

## 2022-07-22 DIAGNOSIS — M79671 Pain in right foot: Secondary | ICD-10-CM | POA: Diagnosis not present

## 2022-07-22 DIAGNOSIS — M79672 Pain in left foot: Secondary | ICD-10-CM | POA: Diagnosis not present

## 2022-07-22 DIAGNOSIS — M79641 Pain in right hand: Secondary | ICD-10-CM | POA: Diagnosis not present

## 2022-07-24 ENCOUNTER — Telehealth: Payer: Self-pay | Admitting: *Deleted

## 2022-07-24 DIAGNOSIS — E785 Hyperlipidemia, unspecified: Secondary | ICD-10-CM | POA: Diagnosis not present

## 2022-07-24 DIAGNOSIS — S63601A Unspecified sprain of right thumb, initial encounter: Secondary | ICD-10-CM | POA: Diagnosis not present

## 2022-07-24 DIAGNOSIS — G4730 Sleep apnea, unspecified: Secondary | ICD-10-CM | POA: Diagnosis not present

## 2022-07-24 DIAGNOSIS — R55 Syncope and collapse: Secondary | ICD-10-CM | POA: Diagnosis not present

## 2022-07-24 DIAGNOSIS — E669 Obesity, unspecified: Secondary | ICD-10-CM | POA: Diagnosis not present

## 2022-07-24 DIAGNOSIS — E119 Type 2 diabetes mellitus without complications: Secondary | ICD-10-CM | POA: Diagnosis not present

## 2022-07-24 DIAGNOSIS — I10 Essential (primary) hypertension: Secondary | ICD-10-CM | POA: Diagnosis not present

## 2022-07-24 DIAGNOSIS — M65311 Trigger thumb, right thumb: Secondary | ICD-10-CM | POA: Diagnosis not present

## 2022-07-24 DIAGNOSIS — I471 Supraventricular tachycardia: Secondary | ICD-10-CM | POA: Diagnosis not present

## 2022-07-24 NOTE — Patient Outreach (Signed)
Care Coordination  07/24/2022  Crystal Gibson Sep 24, 1971 [id number]  Transition Care Management Follow-up Telephone Call Date of discharge and from where: 07/23/22, [facility] How have you been since you were released from the hospital? "I have pain in both feet, decreased mobility and blisters on both feet" Any questions or concerns? Yes, patient needing oxygen supplies. RNCM advised patient to contact pulmonologist to request needed supplies.  Items Reviewed: Did the pt receive and understand the discharge instructions provided? Yes  Medications obtained and verified?  No new medications prescribed Other? No  Any new allergies since your discharge? No  Dietary orders reviewed? No Do you have support at home? Yes   Home Care and Equipment/Supplies: Were home health services ordered? no If so, what is the name of the agency? N/A  Has the agency set up a time to come to the patient's home? not applicable Were any new equipment or medical supplies ordered?  No What is the name of the medical supply agency? N/A Were you able to get the supplies/equipment? not applicable Do you have any questions related to the use of the equipment or supplies? No  Functional Questionnaire: (I = Independent and D = Dependent) ADLs: I  Bathing/Dressing- I  Meal Prep- I  Eating- I  Maintaining continence- I  Transferring/Ambulation- I  Managing Meds- I  Follow up appointments reviewed:  PCP Hospital f/u appt confirmed?  N/A-ED visit  Scheduled to see Podiatry on 07/25/22  Specialist Hospital f/u appt confirmed?  ED visit  Scheduled to see Podiatry on 07/25/22. Are transportation arrangements needed? No  If their condition worsens, is the pt aware to call PCP or go to the Emergency Dept.? Yes Was the patient provided with contact information for the PCP's office or ED? No Was to pt encouraged to call back with questions or concerns? Yes  Estanislado Emms RN, BSN [place]  [employer] [employer] copywriter

## 2022-07-25 ENCOUNTER — Encounter (INDEPENDENT_AMBULATORY_CARE_PROVIDER_SITE_OTHER): Payer: Medicaid Other | Admitting: Podiatry

## 2022-07-25 ENCOUNTER — Ambulatory Visit (INDEPENDENT_AMBULATORY_CARE_PROVIDER_SITE_OTHER): Payer: Medicaid Other | Admitting: Podiatry

## 2022-07-25 DIAGNOSIS — L0889 Other specified local infections of the skin and subcutaneous tissue: Secondary | ICD-10-CM

## 2022-07-25 DIAGNOSIS — L089 Local infection of the skin and subcutaneous tissue, unspecified: Secondary | ICD-10-CM

## 2022-07-25 NOTE — Progress Notes (Signed)
No Show This encounter was created in error - please disregard. 

## 2022-07-28 NOTE — Progress Notes (Signed)
Subjective:   Patient ID: Crystal Gibson, female   DOB: 51 y.o.   MRN: [id number]   HPI Patient presents stating she was scared because she developed blisters 4 days ago and was seen at the ER.  She states they have improved but she still wants it checked that she does have diabetes   ROS      Objective:  Physical Exam  Neurovascular status found to be intact muscle strength found to be adequate range of motion within normal limits currently.  Patient's plantar feet do show some discoloration with what appears to be history of blisters but I did not note any drainage currently and I did not note any proximal edema erythema drainage noted     Assessment:  Probability for fungal infection or possible bacterial infection which appears to have resolved currently     Plan:  H&P reviewed condition recommended continued soaks daily inspections and if any blisters were to occur or any breaks in skin she is to reappoint immediately.  Discussed her diabetes and the fact she does need to do daily inspections and gave her advice on good foot care in general

## 2022-07-29 DIAGNOSIS — E119 Type 2 diabetes mellitus without complications: Secondary | ICD-10-CM | POA: Diagnosis not present

## 2022-07-29 DIAGNOSIS — G4730 Sleep apnea, unspecified: Secondary | ICD-10-CM | POA: Diagnosis not present

## 2022-07-29 DIAGNOSIS — E669 Obesity, unspecified: Secondary | ICD-10-CM | POA: Diagnosis not present

## 2022-07-29 DIAGNOSIS — I10 Essential (primary) hypertension: Secondary | ICD-10-CM | POA: Diagnosis not present

## 2022-07-29 DIAGNOSIS — I471 Supraventricular tachycardia: Secondary | ICD-10-CM | POA: Diagnosis not present

## 2022-07-29 DIAGNOSIS — E785 Hyperlipidemia, unspecified: Secondary | ICD-10-CM | POA: Diagnosis not present

## 2022-07-30 ENCOUNTER — Other Ambulatory Visit: Payer: Self-pay | Admitting: Obstetrics and Gynecology

## 2022-07-30 DIAGNOSIS — U071 COVID-19: Secondary | ICD-10-CM

## 2022-07-30 NOTE — Patient Instructions (Addendum)
Hi Crystal Gibson, thanks for speaking with me-I hope you feel better.  Crystal Gibson was given information about Medicaid Managed Care team care coordination services as a part of their [place] [employer] Medicaid benefit. Crystal Gibson verbally consented to engagement with the [facility] Managed Care team.   If you are experiencing a medical emergency, please call 911 or report to your local emergency department or urgent care.   If you have a non-emergency medical problem during routine business hours, please contact your provider's office and ask to speak with a nurse.   For questions related to your [place] [employer] Medicaid health plan, please call: [phone]  If you would like to schedule transportation through your [place] [employer] Medicaid plan, please call the following number at least 2 days in advance of your appointment: [phone].   There is no limit to the number of trips during the year between medical appointments, healthcare facilities, or pharmacies. Transportation must be scheduled at least 2 business days before but not more than thirty 30 days before of your appointment.  Call the Behavioral Health Crisis Line at [phone], at any time, 24 hours a day, 7 days a week. If you are in danger or need immediate medical attention call 911.  If you would like help to quit smoking, call 1-800-QUIT-NOW (609 582 8901) OR Espaol: 1-855-Djelo-Ya (6-269-485-4627) o para ms informacin haga clic aqu or Text READY to 035-009 to register via text  Crystal Gibson - following are the goals we discussed in your visit today:   Timeframe:  Long-Range Goal Priority:  High Start Date:      02/21/22                       Expected End Date:   ongoing                    Follow Up Date:  09/08/22   - schedule appointment for flu shot - schedule appointment for vaccines needed due to my age or health - schedule recommended health tests (blood work, mammogram, colonoscopy, pap test) - schedule  and keep appointment for annual check-up    Why is this important?   Screening tests can find diseases early when they are easier to treat.  Your doctor or nurse will talk with you about which tests are important for you.  Getting shots for common diseases like the flu and shingles will help prevent them. 05/30/22:  patient being followed at [facility] and other practices-awaiting LTSD for therapy appts  Patient verbalizes understanding of instructions and care plan provided today and agrees to view in MyChart. Active MyChart status and patient understanding of how to access instructions and care plan via MyChart confirmed with patient.     The Managed Medicaid care management team will reach out to the patient again over the next 30 business  days.  The  Patient has been provided with contact information for the Managed Medicaid care management team and has been advised to call with any health related questions or concerns.   Crystal Der RN, BSN [place]  [facility] Care Management Coordinator - Managed Medicaid High Risk [phone]   Following is a copy of your plan of care:  Care Plan : RN Care Manager Plan of Care  Updates made by Danie Chandler, RN since 07/30/2022 12:00 AM     Problem: Chronic Disease Management and Care Coordination Needs   Priority: High     Long-Range Goal:  Self-Management Plan Developed   Start Date: 02/21/2022  Expected End Date: 09/24/2022  Recent Progress: Not on track  Priority: High  Note:   Current Barriers:  Care Coordination needs related to Financial constraints related to post COVID and needing assistance with food and paying mortgage, Medication procurement, and Lacks knowledge of community resource: for food resources and mortgage assistance 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  No change in ongoing symptoms, CP, SOB, fatigue, brain fog.  Did experience syncope and is being followed by [facility].  Patient awaiting LTD payments so she can continue therapies.  Financial resources remain an issue for patient-will refer.  RNCM Clinical Goal(s):  Patient will take all medications exactly as prescribed and will call provider for medication related questions as evidenced by patient report attend all scheduled medical appointments as evidenced by patient report and patient appointment  continue to work with RN Care Manager to address care management and care coordination needs related to  DMII and post COVID as evidenced by adherence to CM Team Scheduled appointments work with pharmacist to address Medication procurement related toDMII as evidenced by review or EMR and patient or pharmacist report work with community resource care guide to address needs related to  Financial constraints related to food and mortgage as evidenced by patient and/or community resource care guide support through collaboration with Medical illustrator, provider, and care team.   Interventions: Inter-disciplinary care team collaboration (see longitudinal plan of care) Evaluation of current treatment plan related to  self management and patient's adherence to plan as established by provider Collaborated with Pharmacy and [employer] for medication, food resources, and mortgage assistance-completed Follow Up with [place] [employer] regarding insulin-completed  Diabetes Interventions:  (Status:  New goal.) Long Term Goal Assessed patient's understanding of A1c goal: <7% Discussed plans with patient for ongoing care management follow up and provided patient with direct contact information for care management team Reviewed scheduled/upcoming provider appointments. Referral made to pharmacy team for assistance with medication-completed Referral made to community resources care guide team for  assistance with food resources and mortgage assistance-completed Review of patient status, including review of consultants reports, relevant laboratory and other test results, and medications completed Assessed social determinant of health barriers Collaborated with care guide for any possible resources that will help patient-care guide referral placed. Lab Results  Component Value Date   HGBA1C 10.0 (H) 02/11/2022  Patient Goals/Self-Care Activities: Take all medications as prescribed Attend all scheduled provider appointments Call pharmacy for medication refills 3-7 days in advance of running out of medications Perform all self care activities independently  Perform IADL's (shopping, preparing meals, housekeeping, managing finances) independently Call provider office for new concerns or questions  Patient has contacted [facility] Medicaid Ombudsman for assistance with medication procurement  Follow Up Plan:  The patient has been provided with contact information for the care management team and has been advised to call with any health related questions or concerns.  The care management team will reach out to the patient again over the next 30 business  days.

## 2022-07-30 NOTE — Patient Outreach (Signed)
Care Coordination  07/30/2022  Crystal Gibson [birth year] [id number]   Medicaid Managed Care   Unsuccessful Outreach Note  07/30/2022 Name: Crystal Gibson MRN: [id number] DOB: 12-18-1971  Referred by: Fayrene Helper, NP Reason for referral : High Risk Managed Medicaid (Unsuccessful telephone outreach)   An unsuccessful telephone outreach was attempted today. The patient was referred to the case management team for assistance with care management and care coordination.   Follow Up Plan: The care management team will reach out to the patient again over the next 30 business days.   Kathi Der RN, BSN [place]  [facility] [employer], production - Managed Medicaid High Risk [phone]

## 2022-07-30 NOTE — Patient Outreach (Signed)
Medicaid Managed Care   Nurse Care Manager Note  07/30/2022 Name:  Crystal Gibson MRN:  [id number] DOB:  10-31-1971  Crystal Gibson is an 51 y.o. year old female who is a primary patient of Crystal Berry, NP.  The [facility] Managed Care Coordination team was consulted for assistance with:    Chronic healthcare management needs, DM, OSA, CP, SOB, post COVID  Ms. Brassfield was given information about Medicaid Managed Care Coordination team services today. Crystal Gibson Patient agreed to services and verbal consent obtained.  Engaged with patient by telephone for follow up visit in response to provider referral for case management and/or care coordination services.   Assessments/Interventions:  Review of past medical history, allergies, medications, health status, including review of consultants reports, laboratory and other test data, was performed as part of comprehensive evaluation and provision of chronic care management services.  SDOH (Social Determinants of Health) assessments and interventions performed: SDOH Interventions    Flowsheet Row Most Recent Value  SDOH Interventions   Financial Strain Interventions Other (Comment)  [patient has resources-patient trying to get disability resources.  Will  refer to community care guide.]  Social Connections Interventions Intervention Not Indicated      Care Plan  Allergies  Allergen Reactions   Glucophage [Metformin] Nausea And Vomiting    Medications Reviewed Today     Reviewed by Gayla Medicus, RN (Registered Nurse) on 06/24/22 at 1257  Med List Status: <None>   Medication Order Taking? Sig Documenting Provider Last Dose Status Informant  ACCU-CHEK FASTCLIX LANCETS [place] [id number] No  [provider] Taking Active Self  albuterol (PROVENTIL HFA;VENTOLIN HFA) 108 (90 BASE) MCG/ACT inhaler [id number] No Inhale 2 puffs into the lungs every 4 (four) hours as needed for wheezing or shortness of breath. Cuthriell, [name], PA-C  Taking Active Self  ARIPiprazole (ABILIFY) 15 MG tablet [id number] No Take 15 mg by mouth daily. [provider] Taking Active   baclofen (LIORESAL) 10 MG tablet [id number] No Take 10 mg by mouth 3 (three) times daily. [provider] Taking Active Self  BD PEN NEEDLE NANO 2ND GEN 32G X 4 MM MISC [id number] No USE THREE TIMES DAILY BEFORE MEALS [provider] Taking Active Self  Blood Glucose Monitoring Suppl (ACCU-CHEK GUIDE) w/Device KIT [id number] No USE DEVICE TO CHECK SUGARS DAILY [provider] Taking Active Self  Butalbital-APAP-Caffeine 50-300-40 MG CAPS [id number] No  [provider] Taking Active   Continuous Blood Gluc Sensor (DEXCOM G6 SENSOR) MISC [id number] No Apply 1 each topically as needed. [provider] Taking Active   DULoxetine (CYMBALTA) 60 MG capsule [id number] No Take 60 mg by mouth daily. [provider] Taking Active Self  Evolocumab (REPATHA SURECLICK) 977 MG/ML SOAJ [id number] No Inject 140 mg into the skin every 14 (fourteen) days. [provider] Taking Active   famotidine (PEPCID) 20 MG tablet [id number] No Take 20 mg by mouth daily. [provider] Taking Active   FARXIGA 10 MG TABS tablet [id number] No Take 10 mg by mouth daily. [provider] Taking Active Self  Fluocinolone Acetonide Scalp 0.01 % OIL [id number] No Apply 1 application topically every 7 (seven) days. [provider] Taking Active Self  fluticasone (FLONASE) 50 MCG/ACT nasal spray [id number] No Place 1 spray into both nostrils 2 (two) times daily. Cuthriell, [name], PA-C Taking Active Self  furosemide (LASIX) 20 MG tablet [id number] No Take 20 mg by mouth daily. [provider] Taking Active Self  gabapentin (NEURONTIN) 300 MG capsule [id number] No Take 1 capsule (300 mg total) by mouth 3 (three) times daily. [name], DPM Taking Active Self  glucose blood (ACCU-CHEK GUIDE) test strip [id number]  No Accu-Chek Guide In Vitro Strip QTY: 200 strip Days: 90 Refills: 1  Written: 04/27/20 Patient Instructions: TEST FASTING BLOOD SUGAR EVERY MORNING FASTING AND EVERY EVENING E11.65 [provider] Taking Active Self  glucose blood ([employer] BLOOD GLUCOSE TEST) test strip [id number] No Use to check blood sugar as directed with insulin 3 times a day & for symptoms of high or low blood sugar. [provider] Taking Active Self  insulin degludec (TRESIBA) 200 UNIT/ML FlexTouch Pen [id number] No Inject 45 Units into the skin daily. [provider] Taking Active Self  Insulin Pen Needle (GLOBAL EASY GLIDE PEN NEEDLES) 32G X 4 MM MISC [id number] No Use for injections three (3) times a day before meals. [provider] Taking Active Self  Lancets Misc. (ACCU-CHEK FASTCLIX LANCET) KIT [id number] No Accu-Chek Fastclix Lancet Drum  USE TO CHECK FASTING IN AM AND AS NEEDED FOR HIGH OR LOW [provider] Taking Active Self  metoprolol succinate (TOPROL-XL) 100 MG 24 hr tablet [id number] No Take 100 mg by mouth. [provider] Taking Active Self  NOVOLOG FLEXPEN 100 UNIT/ML FlexPen [id number] No Inject 10 Units into the skin 3 (three) times daily with meals. [provider] Taking Active   olmesartan (BENICAR) 5 MG tablet [id number] No Take 5 mg by mouth daily. [provider] Taking Active Self  ONETOUCH VERIO test strip [id number] No 1 each 4 (four) times daily. [provider] Taking Active Self  OXYGEN [id number] No Inhale 2 L into the lungs as needed (exertion). [provider] Taking Active Self  pantoprazole (PROTONIX) 40 MG tablet [id number] No Take 40 mg by mouth daily. [provider] Taking Active   rosuvastatin (CRESTOR) 40 MG tablet [id number] No Take 40 mg by mouth daily. [provider] Taking Active Self  Semaglutide (RYBELSUS) 7 MG TABS [id number] No Take 1 tablet by mouth daily. [provider]  Taking Active   SYMBICORT 160-4.5 MCG/ACT inhaler [id number] No Inhale 2 puffs into the lungs in the morning and at bedtime. [provider] Taking Active Self  topiramate (TOPAMAX) 25 MG tablet [id number] No Take 50 mg by mouth at bedtime. [provider] Taking Active   traZODone (DESYREL) 100 MG tablet [id number] No Take 200 mg by mouth at bedtime.  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 03/06/2021   Chronic respiratory failure with hypoxia ([place]) 03/06/2021   Multiple subsegmental pulmonary emboli without acute cor pulmonale (HCC) 03/06/2021   OSA (obstructive sleep apnea) 03/06/2021   Sesamoiditis of foot 11/05/2020   Ganglion cyst of tendon sheath of left hand 11/03/2018   Displacement of lumbar intervertebral disc without myelopathy 11/03/2018   Lumbar sprain 11/03/2018   Sprain of ligament of tarsometatarsal joint 04/17/2016   Health examination of defined subpopulation 09/14/2014   Conditions to be addressed/monitored per PCP order:  Chronic healthcare management needs, DM, OSA, CP, SOB, post COVID  Care Plan : RN Care Manager Plan of Care  Updates made by Gayla Medicus, RN since 07/30/2022 12:00 AM     Problem: Chronic Disease Management and Care Coordination Needs   Priority: High     Long-Range  Goal: Self-Management Plan Developed   Start Date: 02/21/2022  Expected End Date: 09/24/2022  Recent Progress: Not on track  Priority: High  Note:   Current Barriers:  Care Coordination needs related to Financial constraints related to post COVID and needing assistance with food and paying mortgage, Medication procurement, and Lacks knowledge of community resource: for food resources and mortgage assistance Chronic Disease Management support and education needs 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  No change in ongoing symptoms, CP, SOB, fatigue, brain fog.  Did experience syncope and is being followed by [facility].  Patient awaiting LTD payments so she can continue therapies.  Financial resources remain an issue for patient-will refer.  RNCM Clinical Goal(s):  Patient will take all medications exactly as prescribed and will call provider for medication related questions as evidenced by patient report attend all scheduled medical appointments as evidenced by patient report and patient appointment  continue to work with RN Care Manager to address care management and care coordination needs related to  DMII and post COVID as evidenced by adherence to CM Team Scheduled appointments work with pharmacist to address Medication procurement related toDMII as evidenced by review or EMR and patient or pharmacist report work with community resource care guide to address needs related to  Financial constraints related to food and mortgage as evidenced by patient and/or community resource care guide support through collaboration with Consulting civil engineer, provider, and care team.   Interventions: Inter-disciplinary care team collaboration (see longitudinal plan of care) Evaluation of current treatment plan related to  self management and patient's adherence to plan as established by provider Collaborated with Pharmacy and [employer] for medication, food resources, and mortgage assistance-completed Follow Up with [place] [facility] regarding insulin-completed  Diabetes Interventions:  (Status:  New goal.) Long Term Goal Assessed patient's understanding of A1c goal: <7% Discussed plans with patient for ongoing care management follow up and provided patient with direct contact information for care management team Reviewed scheduled/upcoming provider appointments. Referral made to pharmacy team for assistance with  medication-completed Referral made to community resources care guide team for assistance with food resources and mortgage assistance-completed Review of patient status, including review of consultants reports, relevant laboratory and other test results, and medications completed Assessed social determinant of health barriers Collaborated with care guide for any possible resources that will help patient-care guide referral placed. Lab Results  Component Value Date   HGBA1C 10.0 (H) 02/11/2022  Patient Goals/Self-Care Activities: Take all medications as prescribed Attend all scheduled provider appointments Call pharmacy for medication refills 3-7 days in advance of running out of medications Perform all self care activities independently  Perform IADL's (shopping, preparing meals, housekeeping, managing finances) independently Call provider office for new concerns or questions  Patient has contacted [facility] Medicaid Ombudsman for assistance with medication procurement  Follow Up Plan:  The patient has been provided with contact information for the care management team and has been advised to call with any health related questions or concerns.  The care management team will reach out to the patient again over the next 30 business  days.    Long-Range Goal: Establish Plan of Care  for Chronic Disease Management Needs   Priority: High  Note:   Timeframe:  Long-Range Goal Priority:  High Start Date:      02/21/22  Expected End Date:   ongoing                    Follow Up Date:  09/08/22   - schedule appointment for flu shot - schedule appointment for vaccines needed due to my age or health - schedule recommended health tests (blood work, mammogram, colonoscopy, pap test) - schedule and keep appointment for annual check-up    Why is this important?   Screening tests can find diseases early when they are easier to treat.  Your doctor or nurse will talk with you about which  tests are important for you.  Getting shots for common diseases like the flu and shingles will help prevent them. 05/30/22:  patient being followed at [facility] and other practices-awaiting [facility]SD for therapy appts    Follow Up:  Patient agrees to Care Plan and Follow-up.  Plan: The Managed Medicaid care management team will reach out to the patient again over the next 30 business  days. and The  Patient has been provided with contact information for the Managed Medicaid care management team and has been advised to call with any health related questions or concerns.  Date/time of next scheduled RN care management/care coordination outreach:  09/08/22 at 230

## 2022-07-31 ENCOUNTER — Other Ambulatory Visit: Payer: Self-pay | Admitting: Pharmacist

## 2022-07-31 ENCOUNTER — Telehealth: Payer: Self-pay | Admitting: Pharmacist

## 2022-07-31 NOTE — Telephone Encounter (Signed)
Attempted to outreach patient for scheduled phone call follow up. Left voicemail for her to return my call at her convenience.    Catie Eppie Gibson, PharmD, [facility] [phone]

## 2022-07-31 NOTE — Chronic Care Management (AMB) (Signed)
Chief Complaint  Patient presents with   High Risk Managed Medicaid    Crystal Gibson is a 51 y.o. year old female who presented for a telephone visit.   They were referred to the pharmacist by their High Risk Managed Medicaid Care Team  for assistance in managing complex medication management.   Patient is participating in a Managed Medicaid Plan:  Yes  Subjective:   Medication Access/Adherence  Current Pharmacy:  [facility] [place], [place] [address] Phone: [phone] Fax: [place], [place] - [place] AT [facility] [place] [address] Phone: [phone] Fax: [phone]   Patient reports affordability concerns with their medications: No  Patient reports access/transportation concerns to their pharmacy: No  Patient reports adherence concerns with their medications:  No     Diabetes:  Current medications: Farxiga 10 mg daily, Rybelsus 7 mg daily, Tresiba U200 48 units daily, Novolog 10 units   Reports A1c at outside facility at 7.3%  Current glucose readings: DexCom G6  Patient denies hypoglycemic s/sx including dizziness, shakiness, sweating. Patient denies hyperglycemic symptoms including polyuria, polydipsia, polyphagia, nocturia, neuropathy, blurred vision.  Hypertension/Tachycardia:  Current medications: olmesartan 5 mg daily, digoxin 0.25 mg daily, corlanor 5 mg twice daily, metoprolol succinate 200 mg daily, furosemide 20 mg daily + potassium 10 mEq daily   Patient has a validated, automated, upper arm home BP cuff Current blood pressure readings readings: reports BP well controlled but HR remains >100. Followed up with cardiology on Monday (outside provider); they are continuing to manage. Reports a fall last week, this was discussed with cardiology.    Hyperlipidemia/ASCVD Risk  Reduction  Current lipid lowering medications: rosuvastatin 40 mg daily, Repatha 140 mg every 2 weeks; Vascepa 2 g twice daily  Reports per cardiology that lipids are now controlled on [place] Maintenance Due  Topic Date Due   HIV Screening  Never done   Hepatitis C Screening  Never done   Zoster Vaccines- Shingrix (1 of 2) Never done   PAP SMEAR-Modifier  Never done   COLONOSCOPY (Pts 45-36yr Insurance coverage will need to be confirmed)  Never done   TETANUS/TDAP  08/05/2020   MAMMOGRAM  09/02/2021   COVID-19 Vaccine (4 - Booster for [employer]) 02/12/2022   INFLUENZA VACCINE  07/29/2022     Medications Reviewed Today     Reviewed by HOsker Mason RPH-CPP (Pharmacist) on 07/31/22 at 1346  Med List Status: <None>   Medication Order Taking? Sig Documenting Provider Last Dose Status Informant  ACCU-CHEK FASTCLIX LANCETS [id number]  [provider] Taking Active Self  albuterol (PROVENTIL HFA;VENTOLIN HFA) 108 (90 BASE) MCG/ACT inhaler [id number] Inhale 2 puffs into the lungs every 4 (four) hours as needed for wheezing or shortness of breath. Cuthriell, JCharline Bills PA-C Taking Active Self  ARIPiprazole (ABILIFY) 15 MG tablet [id number] Take 15 mg by mouth daily. [provider] Taking Active   baclofen (LIORESAL) 10 MG tablet [id number] Take 10 mg by mouth 3 (three) times daily. [provider] Taking Active Self  BD PEN NEEDLE NANO 2ND GEN 32G X 4 MM MISC [id number] USE THREE TIMES DAILY BEFORE MEALS [provider] Taking Active Self  Blood Glucose Monitoring Suppl (ACCU-CHEK GUIDE) w/Device KIT [id number] USE  DEVICE TO CHECK SUGARS DAILY [provider] Taking Active Self  Butalbital-APAP-Caffeine 50-300-40 MG CAPS [id number] Yes Take 1 capsule by mouth 2 times daily at 12 noon and 4 pm. [provider] Taking Active   Continuous Blood Gluc Sensor (DEXCOM G6  SENSOR) [place] [id number] Yes Apply 1 each topically as needed. [provider] Taking Active   CORLANOR 5 MG TABS tablet [id number] Yes Take 5 mg by mouth 2 (two) times daily. [provider] Taking Active   digoxin (LANOXIN) 0.25 MG tablet [id number] Yes Take 0.25 mg by mouth daily. [provider] Taking Active Self  DULoxetine (CYMBALTA) 60 MG capsule [id number] Yes Take 60 mg by mouth daily. [provider] Taking Active Self  Evolocumab (REPATHA SURECLICK) 287 MG/ML [place] [id number] Yes Inject 140 mg into the skin every 14 (fourteen) days. [provider] Taking Active   famotidine (PEPCID) 20 MG tablet [id number] Yes Take 20 mg by mouth daily. [provider] Taking Active   FARXIGA 10 MG TABS tablet [id number] Yes Take 10 mg by mouth daily. [provider] Taking Active Self  fluticasone (FLONASE) 50 MCG/ACT nasal spray [id number] Yes Place 1 spray into both nostrils 2 (two) times daily. Cuthriell, [name], PA-C Taking Active Self  furosemide (LASIX) 20 MG tablet [id number] Yes Take 20 mg by mouth daily. [provider] Taking Active Self  gabapentin (NEURONTIN) 300 MG capsule [id number] Yes Take 1 capsule (300 mg total) by mouth 3 (three) times daily. [name], DPM Taking Active Self  glucose blood (ACCU-CHEK GUIDE) test strip [id number] Yes Accu-Chek Guide In Vitro Strip QTY: 200 strip Days: 90 Refills: 1  Written: 04/27/20 Patient Instructions: TEST FASTING BLOOD SUGAR EVERY MORNING FASTING AND EVERY EVENING E11.65 [provider] Taking Active Self  glucose blood ([employer] BLOOD GLUCOSE TEST) test strip [id number] Yes Use to check blood sugar as directed with insulin 3 times a day & for symptoms of high or low blood sugar. [provider] Taking Active Self  icosapent Ethyl (VASCEPA) 1 g capsule [id number] Yes Take 2 capsules by mouth 2 (two) times daily. [provider]  Active   insulin degludec  (TRESIBA) 200 UNIT/ML FlexTouch Pen [id number] Yes Inject 48 Units into the skin daily. [provider] Taking Active Self  Insulin Pen Needle (GLOBAL EASY GLIDE PEN NEEDLES) 32G X 4 MM MISC [id number] Yes Use for injections three (3) times a day before meals. [provider] Taking Active Self  Lancets Misc. (ACCU-CHEK FASTCLIX LANCET) KIT [id number] Yes Accu-Chek Fastclix Lancet Drum  USE TO CHECK FASTING IN AM AND AS NEEDED FOR HIGH OR LOW [provider] Taking Active Self  metoprolol succinate (TOPROL-XL) 100 MG 24 hr tablet [id number] Yes Take 200 mg by mouth daily. [provider] Taking Active Self           Med Note Jodi Mourning, Cloy Cozzens T   Thu Jul 31, 2022  1:40 PM)    NOVOLOG FLEXPEN 100 UNIT/ML FlexPen [id number] Yes Inject 10 Units into the skin 3 (three) times daily with meals. [provider] Taking Active   olmesartan (BENICAR) 5 MG tablet [id number] Yes Take 5 mg by mouth daily. [provider] Taking Active Self  ONETOUCH VERIO test strip [id number] Yes 1 each 4 (four) times daily. [provider] Taking Active Self  OXYGEN [id number] Yes Inhale 2 L into the lungs as needed (exertion). [provider] Taking Active Self  pantoprazole (PROTONIX) 40 MG tablet [id number] Yes Take  40 mg by mouth daily. [provider] Taking Active   potassium chloride (KLOR-CON M) 10 MEQ tablet [id number] Yes Take 10 mEq by mouth daily. [provider] Taking Active   rosuvastatin (CRESTOR) 40 MG tablet [id number] Yes Take 40 mg by mouth daily. [provider] Taking Active Self  Semaglutide (RYBELSUS) 7 MG TABS [id number] Yes Take 1 tablet by mouth daily. [provider] Taking Active   SYMBICORT 160-4.5 MCG/ACT inhaler [id number] Yes Inhale 2 puffs into the lungs in the morning and at bedtime. [provider] Taking Active Self  topiramate (TOPAMAX) 25 MG tablet [id number] Yes Take 50 mg by mouth at  bedtime. [provider] Taking Active   traZODone (DESYREL) 100 MG tablet [id number] Yes Take 200 mg by mouth at bedtime.  [provider] Taking Active Self              Assessment/Plan:   Diabetes: - Currently controlled per patient report of outside A1c. Praised for improvement - Recommend to continue current regimen at this time along with endocrinology collaboration  - Recommend to check glucose consistently using CGM  Hypertension/Tachycardia: - Currently uncontrolled, but per patient report cardiology is working with her on this. Continue current regimen. Continue to evaluate for falls risk reduction.  - Recommend to continue current regimen at this time  Hyperlipidemia/ASCVD Risk Reduction: - Currently controlled per patient report of outside lipid panel. - Recommend to continue current regimen at this time along with cardiology collaboration.  Patient reports her disability check is past due and she is having a difficult time financially. Scheduled for call with BSW tomorrow to discuss.    Follow Up Plan: follow up with RN CM and BSW as scheduled   Catie Hedwig Morton, PharmD, [facility] [phone]

## 2022-07-31 NOTE — Patient Instructions (Addendum)
[place],   It was great working with you! Keep up the great work. Please let me know if you have any future issues related to your medications.   Catie Eppie Gibson, PharmD, [facility] [phone]

## 2022-07-31 NOTE — Telephone Encounter (Signed)
Patient returned call

## 2022-08-01 ENCOUNTER — Telehealth: Payer: Self-pay

## 2022-08-01 ENCOUNTER — Other Ambulatory Visit: Payer: Self-pay

## 2022-08-01 NOTE — Telephone Encounter (Signed)
   Telephone encounter was:  Unsuccessful.  08/01/2022 Name: Crystal Gibson MRN: [id number] DOB: 10/17/1971  Unsuccessful outbound call made today to assist with:  Food Insecurity and Financial Difficulties related to mortgage, utilities.  Outreach Attempt:  1st Attempt  A HIPAA compliant voice message was left requesting a return call.  Instructed patient to call back at [phone].  Tahira Olivarez, AAS Paralegal, [facility] Care Guide  Embedded Care Coordination [place]  Care Management  [address] [place], [place] [address] [email or web address]  ?? [phone]   [email or web address]

## 2022-08-01 NOTE — Patient Outreach (Signed)
Care Coordination  08/01/2022  Crystal Gibson Jan 09, 1971 [id number]   Medicaid Managed Care   Unsuccessful Outreach Note  08/01/2022 Name: Crystal Gibson MRN: [id number] DOB: 08/14/1971  Referred by: Fayrene Helper, NP Reason for referral : High Risk Managed Medicaid (MM social work Unsuccessful [employer])   An unsuccessful telephone outreach was attempted today. The patient was referred to the case management team for assistance with care management and care coordination.   Follow Up Plan: The care management team will reach out to the patient again over the next 7 days.   Gus Puma, BSW, [place] [facility]  [place]  High Risk Managed Medicaid Team  [phone]

## 2022-08-01 NOTE — Patient Instructions (Signed)
Visit Information  Ms. Crystal Gibson  - as a part of your Medicaid benefit, you are eligible for care management and care coordination services at no cost or copay. I was unable to reach you by phone today but would be happy to help you with your health related needs. Please feel free to call me @ [phone]).   A member of the Managed Medicaid care management team will reach out to you again over the next 7 days.   Gus Puma, BSW, [place] [facility]  [place]  High Risk Managed Medicaid Team  [phone]

## 2022-08-06 ENCOUNTER — Telehealth: Payer: Self-pay

## 2022-08-06 NOTE — Telephone Encounter (Signed)
   Telephone encounter was:  Successful.  08/06/2022 Name: Crystal Gibson MRN: [id number] DOB: Sep 04, 1971  [name] is a 51 y.o. year old female who is a primary care patient of Fayrene Helper, NP . The community resource team was consulted for assistance with Food Insecurity and Financial Difficulties related to utilities and mortgage.  Care guide performed the following interventions: Spoke with patient verified email address [email or web address]. Emailed [employer], [place] [employer] list and [employer]/[place], debt or mortgage counseling.  Asked patient to send a email confirming receipt or to contact me if she does not receive it.  Follow Up Plan:  No further follow up planned at this time. The patient has been provided with needed resources.  [name], AAS Paralegal, [facility] Care Guide  Embedded Care Coordination [place]  Care Management  [address] [place], [place] [address] ??[email or web address]  ?? [phone]   [email or web address]

## 2022-08-12 ENCOUNTER — Other Ambulatory Visit: Payer: Self-pay

## 2022-08-12 ENCOUNTER — Other Ambulatory Visit: Payer: Self-pay | Admitting: Obstetrics and Gynecology

## 2022-08-12 DIAGNOSIS — E785 Hyperlipidemia, unspecified: Secondary | ICD-10-CM | POA: Diagnosis not present

## 2022-08-12 DIAGNOSIS — G4730 Sleep apnea, unspecified: Secondary | ICD-10-CM | POA: Diagnosis not present

## 2022-08-12 DIAGNOSIS — E119 Type 2 diabetes mellitus without complications: Secondary | ICD-10-CM | POA: Diagnosis not present

## 2022-08-12 DIAGNOSIS — I10 Essential (primary) hypertension: Secondary | ICD-10-CM | POA: Diagnosis not present

## 2022-08-12 DIAGNOSIS — E669 Obesity, unspecified: Secondary | ICD-10-CM | POA: Diagnosis not present

## 2022-08-12 DIAGNOSIS — I471 Supraventricular tachycardia: Secondary | ICD-10-CM | POA: Diagnosis not present

## 2022-08-14 DIAGNOSIS — G4733 Obstructive sleep apnea (adult) (pediatric): Secondary | ICD-10-CM | POA: Diagnosis not present

## 2022-08-21 DIAGNOSIS — E119 Type 2 diabetes mellitus without complications: Secondary | ICD-10-CM | POA: Diagnosis not present

## 2022-08-21 DIAGNOSIS — G4730 Sleep apnea, unspecified: Secondary | ICD-10-CM | POA: Diagnosis not present

## 2022-08-21 DIAGNOSIS — E669 Obesity, unspecified: Secondary | ICD-10-CM | POA: Diagnosis not present

## 2022-08-21 DIAGNOSIS — I10 Essential (primary) hypertension: Secondary | ICD-10-CM | POA: Diagnosis not present

## 2022-08-21 DIAGNOSIS — E785 Hyperlipidemia, unspecified: Secondary | ICD-10-CM | POA: Diagnosis not present

## 2022-08-21 DIAGNOSIS — I471 Supraventricular tachycardia: Secondary | ICD-10-CM | POA: Diagnosis not present

## 2022-08-29 ENCOUNTER — Telehealth: Payer: Self-pay

## 2022-08-29 NOTE — Telephone Encounter (Signed)
Pt has a f/up appointment 9/5 to discuss surgical intervention after A1C and surgical clearance from her pulm were received. I am not seeing the A1C.   L/M to see if it was done.

## 2022-09-02 ENCOUNTER — Encounter: Payer: Self-pay | Admitting: Student

## 2022-09-02 ENCOUNTER — Ambulatory Visit (INDEPENDENT_AMBULATORY_CARE_PROVIDER_SITE_OTHER): Payer: Medicaid Other | Admitting: Student

## 2022-09-02 DIAGNOSIS — M793 Panniculitis, unspecified: Secondary | ICD-10-CM

## 2022-09-02 NOTE — Progress Notes (Signed)
   Referring Provider Fayrene Helper, NP [address] [place],  [place] [address]   CC:  Chief Complaint  Patient presents with   Follow-up      Crystal Gibson is an 51 y.o. female.  HPI: Patient is a 51 y.o. year old female here for follow up after   She was seen for initial consult by Dr. Domenica Reamer on 01/13/2022.  At that time, patient reported lower abdominal rashes and back pain due to her abdominal pannus.  Patient was found to be a good candidate for panniculectomy.  Patient was later seen by Dr. Domenica Reamer on 04/09/2022.  Patient was found to have a complicated past medical history including history of long COVID and PE.  Plan was for patient to recheck her hemoglobin A1c and review pulmonary recommendations prior to her panniculectomy.  I reviewed the patient's chart.  It appears that surgical clearance was received from Marisue Ivan, FNP cardiology.  Per [employer], patient may hold her Eliquis 3 days prior to surgery and resume the day after surgery assuming bleeding is controlled.  Per patient's chart, it appears the patient had a visit with her pulmonologist, Dr. Deniece Portela, at [facility] on 07/09/2022.  Per Dr. Spero Curb note, patient's risk of pulmonary complication and risk of postoperative respiratory failure are both low.  Most recent A1c per patient's chart was on 02/11/2022.  It was 10.0.  Today, patient reports that she has been cleared by cardiology and pulmonology for surgery.  Patient states she is no longer on Eliquis.  Patient reports that she also recently had her A1c completed approximately 1 month ago and that it was 7.1.  She states that she had this done at [facility].   Patient denies any recent fevers, chills or shortness of breath.  She denies any recent changes in her health.  She denies any recent hospitalizations or new medical diagnoses.  Patient states that she would like to move forward with surgery and that she is eager to have her surgery done.   Review  of Systems General: Denies fevers, chills MSK: Lower back pain  Physical Exam    04/09/2022    3:22 PM 02/11/2022    9:56 AM 01/31/2022    2:52 PM  Vitals with BMI  Height  5\' 9"  5\' 9"   Weight 232 lbs 222 lbs 11 oz 225 lbs  BMI  32.87 33.21  Systolic  133 129  Diastolic  86 79  Pulse  103 94    General:  No acute distress,  Alert and oriented, Non-Toxic, Normal speech and affect Psych: Normal behavior and mood Respiratory: No increased WOB MSK: Ambulatory  Assessment/Plan  Panniculitis   I discussed with the patient that I will discuss the case with Dr. and we will work on getting her on the surgery schedule.    I discussed with the patient that I am unable to find her most recent A1c in the chart at the time, but will discuss her A1c with Dr. .  Pulmonology clearance will be resent to patient's cardiologist.  I instructed the patient to call back if she has any other questions or concerns.  Domenica Reamer 09/02/2022, 12:42 PM

## 2022-09-02 NOTE — Progress Notes (Signed)
I attempted to call [facility] today to discuss patient's most recent A1c.  There was no answer and voicemail was left.

## 2022-09-04 ENCOUNTER — Telehealth: Payer: Self-pay | Admitting: *Deleted

## 2022-09-04 NOTE — Telephone Encounter (Signed)
Lab results received from Dr. Milta Deiters office and given to [place], [place]. Copy sent to batch scanning

## 2022-09-08 ENCOUNTER — Other Ambulatory Visit: Payer: Self-pay | Admitting: Obstetrics and Gynecology

## 2022-09-08 NOTE — Patient Instructions (Signed)
Hey Crystal Gibson, nice to speak with you today-hope your afternoon goes okay.  Crystal Gibson was given information about Medicaid Managed Care team care coordination services as a part of their [place] benefit. Crystal Gibson verbally consented to engagement with the [facility] Managed Care team.   If you are experiencing a medical emergency, please call 911 or report to your local emergency department or urgent care.   If you have a non-emergency medical problem during routine business hours, please contact your provider's office and ask to speak with a nurse.   For questions related to your [place] health plan, please call: [phone]  If you would like to schedule transportation through your [place] Medicaid plan, please call the following number at least 2 days in advance of your appointment: [phone].   There is no limit to the number of trips during the year between medical appointments, healthcare facilities, or pharmacies. Transportation must be scheduled at least 2 business days before but not more than thirty 30 days before of your appointment.  Call the Behavioral Health Crisis Line at [phone] [phone], at any time, 24 hours a day, 7 days a week. If you are in danger or need immediate medical attention call 911.  If you would like help to quit smoking, call 1-800-QUIT-NOW (212 709 9495) OR Espaol: 1-855-Djelo-Ya (4-008-676-1950) o para ms informacin haga clic aqu or Text READY to 932-671 to register via text  Crystal Gibson - following are the goals we discussed in your visit today:  Timeframe:  Long-Range Goal Priority:  High Start Date:      02/21/22                       Expected End Date:   ongoing                    Follow Up Date: 10/13/22   - schedule appointment for flu shot - schedule appointment for vaccines needed due to my age or health - schedule recommended health tests (blood work, mammogram, colonoscopy, pap test) -  schedule and keep appointment for annual check-up    Why is this important?   Screening tests can find diseases early when they are easier to treat.  Your doctor or nurse will talk with you about which tests are important for you.  Getting shots for common diseases like the flu and shingles will help prevent them. 09/08/22:  Patient attending all appts.  Patient verbalizes understanding of instructions and care plan provided today and agrees to view in MyChart. Active MyChart status and patient understanding of how to access instructions and care plan via MyChart confirmed with patient.     The Managed Medicaid care management team will reach out to the patient again over the next 30 business  days.  The  Patient has been provided with contact information for the Managed Medicaid care management team and has been advised to call with any health related questions or concerns.   Kathi Der RN, BSN [place]  [employer] Engineer, production - Managed Medicaid High Risk [phone].   Following is a copy of your plan of care:  Care Plan : RN Care Manager Plan of Care  Updates made by Danie Chandler, RN since 09/08/2022 12:00 AM     Problem: Chronic Disease Management and Care Coordination Needs   Priority: High     Long-Range Goal: Self-Management Plan Developed   Start Date: 02/21/2022  Expected End Date: 12/08/2022  Recent Progress: Not on track  Priority: High  Note:   Current Barriers:  Care Coordination needs related to Financial constraints related to post COVID and needing assistance with food and paying mortgage, Medication procurement, and Lacks knowledge of community resource: for food resources and mortgage assistance Chronic Disease Management support and education needs 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  Financial Constraints  Difficulty obtaining medications 09/08/22: Patient tachycardic today, awaiting call from provider, ? Related to new med.   Awaiting disability determination.  To possibly have panniculus surgery soon.  RNCM Clinical Goal(s):  Patient will take all medications exactly as prescribed and will call provider for medication related questions as evidenced by patient report attend all scheduled medical appointments as evidenced by patient report and patient appointment  continue to work with RN Care Manager to address care management and care coordination needs related to  DMII and post COVID as evidenced by adherence to CM Team Scheduled appointments work with pharmacist to address Medication procurement related toDMII as evidenced by review or EMR and patient or pharmacist report work with community resource care guide to address needs related to  Financial constraints related to food and mortgage as evidenced by patient and/or community resource care guide support through collaboration with Medical illustrator, provider, and care team.   Interventions: Inter-disciplinary care team collaboration (see longitudinal plan of care) Evaluation of current treatment plan related to  self management and patient's adherence to plan as established by provider Collaborated with Pharmacy and [employer] for medication, food resources, and mortgage assistance-completed Follow Up with [employer] regarding insulin-completed Collaborated with SW. SW referral for resources. Diabetes Interventions:  (Status:  New goal.) Long Term Goal Assessed patient's understanding of A1c goal: <7% Discussed plans with patient for ongoing care management follow up and provided patient with direct contact information for care management team Reviewed scheduled/upcoming provider appointments. Referral made to pharmacy team for assistance with medication-completed Referral made to community resources care guide team for assistance with food resources and mortgage assistance-completed Review of patient status, including review of consultants  reports, relevant laboratory and other test results, and medications completed Assessed social determinant of health barriers Collaborated with care guide for any possible resources that will help patient-care guide referral placed. Lab Results  Component Value Date   HGBA1C 10.0 (H) 02/11/2022  Patient Goals/Self-Care Activities: Take all medications as prescribed Attend all scheduled provider appointments Call pharmacy for medication refills 3-7 days in advance of running out of medications Perform all self care activities independently  Perform IADL's (shopping, preparing meals, housekeeping, managing finances) independently Call provider office for new concerns or questions  Patient has contacted [facility] Medicaid Ombudsman for assistance with medication procurement  Follow Up Plan:  The patient has been provided with contact information for the care management team and has been advised to call with any health related questions or concerns.  The care management team will reach out to the patient again over the next 30 business  days.

## 2022-09-08 NOTE — Patient Outreach (Signed)
Medicaid Managed Care   Nurse Care Manager Note  09/08/2022 Name:  Crystal Gibson MRN:  [id number] DOB:  [birth year]  Crystal Gibson is an 51 y.o. year old female who is a primary patient of Crystal Berry, NP.  The [facility] Managed Care Coordination team was consulted for assistance with:    Chronic healthcare management needs, DM, OSA, CP, SOB, post COVID, brain fog  Crystal Gibson was given information about Medicaid Managed Care Coordination team services today. Crystal Gibson Patient agreed to services and verbal consent obtained.  Engaged with patient by telephone for follow up visit in response to provider referral for case management and/or care coordination services.   Assessments/Interventions:  Review of past medical history, allergies, medications, health status, including review of consultants reports, laboratory and other test data, was performed as part of comprehensive evaluation and provision of chronic care management services.  SDOH (Social Determinants of Health) assessments and interventions performed: SDOH Interventions    Flowsheet Row Patient Outreach Telephone from 09/08/2022 in [place] Coordination Telephone from 08/06/2022 in [place] Patient Outreach Telephone from 07/30/2022 in [place] Patient Outreach Telephone from 06/24/2022 in [place] Patient Outreach Telephone from 03/21/2022 in [place] Patient Outreach Telephone from 02/21/2022 in [place] Interventions -- Other (Comment)  [Emailed food Firefighter and county resource guide] -- -- Other (Comment)  [care guide referral made and resources provided] --  Housing Interventions -- -- -- Other (Comment)  [patient has resources she is  utilizing] -- --  Transportation Interventions -- -- -- -- -- Intervention Not Indicated  Utilities Interventions Intervention Not Indicated -- -- -- -- --  Financial Strain Interventions -- Other (Comment)  [employer], food pantry list and [employer], debt or mortgage counseling.] Other (Comment)  [patient has resources-patient trying to get disability resources.  Will  refer to community care guide.] -- -- --  Physical Activity Interventions Intervention Not Indicated, Other (Comments)  [not physically able to] -- -- -- -- --  Stress Interventions -- -- -- Patient Refused  [offered SW and resources-declined] -- --  Social Connections Interventions -- -- Intervention Not Indicated -- -- --     Care Plan  Allergies  Allergen Reactions   Glucophage [Metformin] Nausea And Vomiting    Medications Reviewed Today     Reviewed by Crystal Medicus, RN (Registered Nurse) on 09/08/22 at [place] List Status: <None>   Medication Order Taking? Sig Documenting Provider Last Dose Status Informant  ACCU-CHEK FASTCLIX LANCETS [place] [id number] No  [provider] Taking Active Self  albuterol (PROVENTIL HFA;VENTOLIN HFA) 108 (90 BASE) MCG/ACT inhaler [id number] No Inhale 2 puffs into the lungs every 4 (four) hours as needed for wheezing or shortness of breath. Cuthriell, [name], PA-C Taking Active Self  ARIPiprazole (ABILIFY) 15 MG tablet [id number] No Take 15 mg by mouth daily. [provider] Taking Active   baclofen (LIORESAL) 10 MG tablet [id number] No Take 10 mg by mouth 3 (three) times daily. [provider] Taking Active Self  BD PEN NEEDLE NANO 2ND GEN 32G X 4 MM MISC [id number] No USE THREE TIMES DAILY BEFORE MEALS [provider] Taking Active Self  Blood Glucose Monitoring Suppl (ACCU-CHEK GUIDE) w/Device KIT [id number] No  USE DEVICE TO CHECK SUGARS DAILY [provider] Taking Active Self   Butalbital-APAP-Caffeine 50-300-40 MG CAPS [id number] No Take 1 capsule by mouth 2 times daily at 12 noon and 4 pm. [provider] Taking Active   Continuous Blood Gluc Sensor (DEXCOM G6 SENSOR) MISC [id number] No Apply 1 each topically as needed. [provider] Taking Active   CORLANOR 5 MG TABS tablet [id number] No Take 5 mg by mouth 2 (two) times daily. [provider] Taking Active   digoxin (LANOXIN) 0.25 MG tablet [id number] No Take 0.25 mg by mouth daily. [provider] Taking Active Self  DULoxetine (CYMBALTA) 60 MG capsule [id number] No Take 60 mg by mouth daily. [provider] Taking Active Self  Evolocumab (REPATHA SURECLICK) 916 MG/ML SOAJ [id number] No Inject 140 mg into the skin every 14 (fourteen) days. [provider] Taking Active   famotidine (PEPCID) 20 MG tablet [id number] No Take 20 mg by mouth daily. [provider] Taking Active   FARXIGA 10 MG TABS tablet [id number] No Take 10 mg by mouth daily. [provider] Taking Active Self  fluticasone (FLONASE) 50 MCG/ACT nasal spray [id number] No Place 1 spray into both nostrils 2 (two) times daily. Cuthriell, [name], PA-C Taking Active Self  furosemide (LASIX) 20 MG tablet [id number] No Take 20 mg by mouth daily. [provider] Taking Active Self  gabapentin (NEURONTIN) 300 MG capsule [id number] No Take 1 capsule (300 mg total) by mouth 3 (three) times daily. [name], DPM Taking Active Self  glucose blood (ACCU-CHEK GUIDE) test strip [id number] No Accu-Chek Guide In Vitro Strip QTY: 200 strip Days: 90 Refills: 1  Written: 04/27/20 Patient Instructions: TEST FASTING BLOOD SUGAR EVERY MORNING FASTING AND EVERY EVENING E11.65 [provider] Taking Active Self  glucose blood ([employer] BLOOD GLUCOSE TEST) test strip [id number] No Use to check blood sugar as directed with insulin 3 times a day & for symptoms of high or low blood sugar. [provider] Taking Active Self  icosapent Ethyl (VASCEPA) 1 g capsule [id number] No Take 2 capsules by mouth 2 (two) times daily. [provider] Taking Active   insulin degludec (TRESIBA) 200 UNIT/ML FlexTouch Pen [id number] No Inject 48 Units into the skin daily. [provider] Taking Active Self  Insulin Pen Needle (GLOBAL EASY GLIDE PEN NEEDLES) 32G X 4 MM MISC [id number] No Use for injections three (3) times a day before meals. [provider] Taking Active Self  Lancets Misc. (ACCU-CHEK FASTCLIX LANCET) KIT [id number] No Accu-Chek Fastclix Lancet Drum  USE TO CHECK FASTING IN AM AND AS NEEDED FOR HIGH OR LOW [provider] Taking Active Self  metoprolol succinate (TOPROL-XL) 100 MG 24 hr tablet [id number] No Take 200 mg by mouth daily. [provider] Taking Active Self           Med Note Jodi Mourning, CATHERINE T   Thu Jul 31, 2022  1:40 PM)    NOVOLOG FLEXPEN 100 UNIT/ML FlexPen [id number] No Inject 10 Units into the skin 3 (three) times daily with meals. [provider] Taking Active   olmesartan (BENICAR) 5 MG tablet [id number] No Take 5 mg by mouth daily. [provider] Taking Active Self  ONETOUCH VERIO test strip [id number] No 1 each 4 (four) times daily. [provider] Taking Active Self  OXYGEN [id number] No Inhale 2 L into the lungs as needed (exertion). [provider] Taking Active Self  pantoprazole (PROTONIX) 40 MG tablet [id number] No  Take 40 mg by mouth daily. [provider] Taking Active   potassium chloride (KLOR-CON M) 10 MEQ tablet [id number] No Take 10 mEq by mouth daily. [provider] Taking Active   rosuvastatin (CRESTOR) 40 MG tablet [id number] No Take 40 mg by mouth daily. [provider] Taking Active Self  Semaglutide (RYBELSUS) 7 MG TABS [id number] No Take 1 tablet by mouth daily. [provider] Taking Active   SYMBICORT 160-4.5 MCG/ACT inhaler [id number] No  Inhale 2 puffs into the lungs in the morning and at bedtime. [provider] Taking Active Self  topiramate (TOPAMAX) 25 MG tablet [id number] No Take 50 mg by mouth at bedtime. [provider] Taking Active   traZODone (DESYREL) 100 MG tablet [id number] No Take 200 mg by mouth at bedtime.  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 03/06/2021   Chronic respiratory failure with hypoxia ([place]) 03/06/2021   Multiple subsegmental pulmonary emboli without acute cor pulmonale (HCC) 03/06/2021   OSA (obstructive sleep apnea) 03/06/2021   Sesamoiditis of foot 11/05/2020   Ganglion cyst of tendon sheath of left hand 11/03/2018   Displacement of lumbar intervertebral disc without myelopathy 11/03/2018   Lumbar sprain 11/03/2018   Sprain of ligament of tarsometatarsal joint 04/17/2016   Health examination of defined subpopulation 09/14/2014   Conditions to be addressed/monitored per PCP order:  Chronic healthcare management needs, DM, OSA, CP, SOB, post COVID, brain fog  Care Plan : RN Care Manager Plan of Care  Updates made by Crystal Medicus, RN since 09/08/2022 12:00 AM     Problem: Chronic Disease Management and Care Coordination Needs   Priority: High     Long-Range Goal: Self-Management Plan Developed   Start Date: 02/21/2022  Expected End Date: 12/08/2022  Recent Progress: Not on track  Priority: High  Note:   Current Barriers:  Care Coordination needs related to Financial constraints related to post COVID and needing assistance with food and paying mortgage, Medication procurement, and Lacks knowledge of community resource: for food resources and mortgage assistance Chronic Disease Management support and education needs 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  Financial Constraints  Difficulty obtaining medications 09/08/22: Patient tachycardic today, awaiting call  from provider, ? Related to new med.  Awaiting disability determination.  To possibly have panniculus surgery soon.  RNCM Clinical Goal(s):  Patient will take all medications exactly as prescribed and will call provider for medication related questions as evidenced by patient report attend all scheduled medical appointments as evidenced by patient report and patient appointment  continue to work with RN Care Manager to address care management and care coordination needs related to  DMII and post COVID as evidenced by adherence to CM Team Scheduled appointments work with pharmacist to address Medication procurement related toDMII as evidenced by review or EMR and patient or pharmacist report work with community resource care guide to address needs related to  Financial constraints related to food and mortgage as evidenced by patient and/or community resource care guide support through collaboration with Consulting civil engineer, provider, and care team.   Interventions: Inter-disciplinary care team collaboration (see longitudinal plan of care) Evaluation of current treatment plan related to  self management and patient's adherence to plan as established by provider Collaborated with Pharmacy and [employer] for medication, food resources, and mortgage assistance-completed Follow Up with [place]  Complete regarding insulin-completed Collaborated with SW. SW referral for resources. Diabetes Interventions:  (Status:  New goal.) Long Term Goal Assessed patient's understanding of A1c goal: <7% Discussed plans with patient for ongoing care management follow up and provided patient with direct contact information for care management team Reviewed scheduled/upcoming provider appointments. Referral made to pharmacy team for assistance with medication-completed Referral made to community resources care guide team for assistance with food resources and mortgage assistance-completed Review of patient  status, including review of consultants reports, relevant laboratory and other test results, and medications completed Assessed social determinant of health barriers Collaborated with care guide for any possible resources that will help patient-care guide referral placed. Lab Results  Component Value Date   HGBA1C 10.0 (H) 02/11/2022  Patient Goals/Self-Care Activities: Take all medications as prescribed Attend all scheduled provider appointments Call pharmacy for medication refills 3-7 days in advance of running out of medications Perform all self care activities independently  Perform IADL's (shopping, preparing meals, housekeeping, managing finances) independently Call provider office for new concerns or questions  Patient has contacted [facility] Medicaid Ombudsman for assistance with medication procurement  Follow Up Plan:  The patient has been provided with contact information for the care management team and has been advised to call with any health related questions or concerns.  The care management team will reach out to the patient again over the next 30 business  days.     Long-Range Goal: Establish Plan of Care  for Chronic Disease Management Needs   Priority: High  Note:   Timeframe:  Long-Range Goal Priority:  High Start Date:      02/21/22                       Expected End Date:   ongoing                    Follow Up Date: 10/13/22   - schedule appointment for flu shot - schedule appointment for vaccines needed due to my age or health - schedule recommended health tests (blood work, mammogram, colonoscopy, pap test) - schedule and keep appointment for annual check-up    Why is this important?   Screening tests can find diseases early when they are easier to treat.  Your doctor or nurse will talk with you about which tests are important for you.  Getting shots for common diseases like the flu and shingles will help prevent them. 09/08/22:  Patient attending all appts.    Follow Up:  Patient agrees to Care Plan and Follow-up.  Plan: The Managed Medicaid care management team will reach out to the patient again over the next 30 business  days. and The  Patient has been provided with contact information for the Managed Medicaid care management team and has been advised to call with any health related questions or concerns.  Date/time of next scheduled RN care management/care coordination outreach: 10/13/22 at 1230.

## 2022-09-09 ENCOUNTER — Telehealth: Payer: Self-pay

## 2022-09-09 NOTE — Telephone Encounter (Signed)
Faxed clearance to Dr. Allyson Sabal for pulmonary clearance.

## 2022-09-10 ENCOUNTER — Other Ambulatory Visit: Payer: Self-pay

## 2022-09-10 NOTE — Patient Outreach (Signed)
Care Coordination  09/10/2022  CHANTELLA CREECH August 27, 1971 [id number]   Medicaid Managed Care   Unsuccessful Outreach Note  09/10/2022 Name: BELL CARBO MRN: [id number] DOB: [birth year]  Referred by: Fayrene Helper, NP Reason for referral : High Risk Managed Medicaid (MM Social Work Unsuccessful [employer] )   An unsuccessful telephone outreach was attempted today. The patient was referred to the case management team for assistance with care management and care coordination.   Follow Up Plan: The patient has been provided with contact information for the care management team and has been advised to call with any health related questions or concerns.   Gus Puma, BSW, [place] [facility]  [place]  High Risk Managed Medicaid Team  [phone]

## 2022-09-10 NOTE — Patient Instructions (Signed)
Visit Information  Ms. [name]  - as a part of your Medicaid benefit, you are eligible for care management and care coordination services at no cost or copay. I was unable to reach you by phone today but would be happy to help you with your health related needs. Please feel free to call me @ [phone].     Gus Puma, BSW, [facility]  [place]  High Risk Managed Medicaid Team  [phone]

## 2022-09-11 ENCOUNTER — Telehealth: Payer: Self-pay

## 2022-09-11 NOTE — Telephone Encounter (Signed)
Patient is returning a call from Violet about lab results.

## 2022-09-17 ENCOUNTER — Other Ambulatory Visit: Payer: Self-pay | Admitting: Student

## 2022-09-17 ENCOUNTER — Telehealth: Payer: Self-pay | Admitting: Student

## 2022-09-17 DIAGNOSIS — E119 Type 2 diabetes mellitus without complications: Secondary | ICD-10-CM

## 2022-09-17 DIAGNOSIS — M793 Panniculitis, unspecified: Secondary | ICD-10-CM

## 2022-09-17 NOTE — Telephone Encounter (Signed)
Patient returned Claire's call.

## 2022-09-17 NOTE — Progress Notes (Signed)
Recheck of patient's Hgb A1C to determine if patient is optimized for possible upcoming surgery.

## 2022-09-18 ENCOUNTER — Telehealth: Payer: Self-pay | Admitting: *Deleted

## 2022-09-18 NOTE — Telephone Encounter (Signed)
Called and spoke with the patient and informed her that an order for her to get lab work done has been written up, and will be faxed to the lab of her choice.  Asked the patient where would she like to go to have her labs drawn.  Patient stated that she would like to go to [facility] [employer] [facility] at [facility].    Informed her that I will fax the order to [facility], and she can call or walk-in to have the lab drawn.  Patient verbalized understanding and agreed.  Tried to fax lab orders to [place] unable to get fax through.  Patient aware.//AB/CMA

## 2022-09-22 ENCOUNTER — Telehealth: Payer: Self-pay | Admitting: *Deleted

## 2022-09-22 NOTE — Telephone Encounter (Signed)
Spoke to Ms. Hallquist regarding orders for repeat A1C pre-op. States she will have the test done this week. She voiced frustration about delays is surgery being scheduled. Provider made aware.

## 2022-09-24 DIAGNOSIS — E785 Hyperlipidemia, unspecified: Secondary | ICD-10-CM | POA: Diagnosis not present

## 2022-09-24 DIAGNOSIS — R5383 Other fatigue: Secondary | ICD-10-CM | POA: Diagnosis not present

## 2022-09-24 DIAGNOSIS — I10 Essential (primary) hypertension: Secondary | ICD-10-CM | POA: Diagnosis not present

## 2022-09-24 DIAGNOSIS — E1165 Type 2 diabetes mellitus with hyperglycemia: Secondary | ICD-10-CM | POA: Diagnosis not present

## 2022-09-24 NOTE — Telephone Encounter (Signed)
Pt is calling in stating that she is at her doctors office and is needing to have the lab orders faxed to the attention Danae Chen at [phone])

## 2022-09-26 DIAGNOSIS — E1165 Type 2 diabetes mellitus with hyperglycemia: Secondary | ICD-10-CM | POA: Diagnosis not present

## 2022-09-26 DIAGNOSIS — E785 Hyperlipidemia, unspecified: Secondary | ICD-10-CM | POA: Diagnosis not present

## 2022-09-26 DIAGNOSIS — E782 Mixed hyperlipidemia: Secondary | ICD-10-CM | POA: Diagnosis not present

## 2022-09-26 DIAGNOSIS — I10 Essential (primary) hypertension: Secondary | ICD-10-CM | POA: Diagnosis not present

## 2022-09-26 DIAGNOSIS — I890 Lymphedema, not elsewhere classified: Secondary | ICD-10-CM | POA: Diagnosis not present

## 2022-09-29 DIAGNOSIS — I10 Essential (primary) hypertension: Secondary | ICD-10-CM | POA: Diagnosis not present

## 2022-10-01 DIAGNOSIS — U099 Post covid-19 condition, unspecified: Secondary | ICD-10-CM | POA: Diagnosis not present

## 2022-10-01 DIAGNOSIS — R0602 Shortness of breath: Secondary | ICD-10-CM | POA: Diagnosis not present

## 2022-10-01 DIAGNOSIS — R55 Syncope and collapse: Secondary | ICD-10-CM | POA: Diagnosis not present

## 2022-10-03 ENCOUNTER — Telehealth: Payer: Self-pay | Admitting: *Deleted

## 2022-10-03 NOTE — Telephone Encounter (Signed)
Called and spoke with the patient and informed her that we received a note informing us that the patient had labs today, and they will send the results when they come in.    Informed the patient that we have not received the HgbA1c results yet.    Informed the patient that I have called [facility] but have not been able to speak with anyone to get the results.  Patient asked if I needed her to call [place] and asked them to send over the recent HgbA1c results.  Informed the patient that would be great.  Patient verbalized understanding and agreed.//AB/CMA

## 2022-10-09 ENCOUNTER — Encounter: Payer: Self-pay | Admitting: Student

## 2022-10-09 ENCOUNTER — Telehealth: Payer: Self-pay | Admitting: *Deleted

## 2022-10-09 NOTE — Telephone Encounter (Signed)
Received on (09/24/22) via of fax a message from Crystal Gibson stating that they only received cover page each time it is faxed.    Patient has labs done today,will send results when they come in.   Copy scanned into the chart.//AB/CMA

## 2022-10-09 NOTE — Progress Notes (Signed)
Received surgical clearance form back from patient's pulmonologist, Dr. Lorrene Reid. Per Dr. Lorrene Reid, she cannot complete surgical clearance but she can provide a risk stratification for pulmonary risk following surgery.  Attached to the form was a copy of the patient's most recent visit with Dr. Lorrene Reid which appears to be from 10/01/2022.  After reviewing the patient's note from her pulmonology visit with Dr. Lorrene Reid on 10/01/2022, it appears the patient's condition from a pulmonology standpoint has worsened as patient has started to use oxygen again and is having more syncopal episodes.    There is no pulmonary risk stratification for surgery mentioned in the note that I can see. I called Dr.Gosche's office to clarify.  A voicemail was left to return my call.

## 2022-10-13 ENCOUNTER — Other Ambulatory Visit: Payer: Self-pay | Admitting: Obstetrics and Gynecology

## 2022-10-13 NOTE — Patient Outreach (Signed)
Care Coordination  10/13/2022  SHYLYNN BRUNING 09/22/1971 [id number]  RNCM called patient at scheduled time.  Patient answered and stated she was at an appointment and would call back.  Aida Raider RN, BSN [place]  Triad Curator - Managed Medicaid High Risk [phone].

## 2022-10-14 ENCOUNTER — Encounter: Payer: Self-pay | Admitting: Student

## 2022-10-14 NOTE — Progress Notes (Signed)
Attempted to call [facility] pulmonology to clarify patient's risk stratification. LVM to call back.

## 2022-10-15 DIAGNOSIS — I890 Lymphedema, not elsewhere classified: Secondary | ICD-10-CM | POA: Diagnosis not present

## 2022-10-15 DIAGNOSIS — R0602 Shortness of breath: Secondary | ICD-10-CM | POA: Diagnosis not present

## 2022-10-15 DIAGNOSIS — U099 Post covid-19 condition, unspecified: Secondary | ICD-10-CM | POA: Diagnosis not present

## 2022-10-15 DIAGNOSIS — R55 Syncope and collapse: Secondary | ICD-10-CM | POA: Diagnosis not present

## 2022-10-15 DIAGNOSIS — E785 Hyperlipidemia, unspecified: Secondary | ICD-10-CM | POA: Diagnosis not present

## 2022-10-15 DIAGNOSIS — E1169 Type 2 diabetes mellitus with other specified complication: Secondary | ICD-10-CM | POA: Diagnosis not present

## 2022-10-15 DIAGNOSIS — E669 Obesity, unspecified: Secondary | ICD-10-CM | POA: Diagnosis not present

## 2022-10-15 DIAGNOSIS — R9431 Abnormal electrocardiogram [ECG] [EKG]: Secondary | ICD-10-CM | POA: Diagnosis not present

## 2022-10-21 DIAGNOSIS — E669 Obesity, unspecified: Secondary | ICD-10-CM | POA: Diagnosis not present

## 2022-10-21 DIAGNOSIS — I10 Essential (primary) hypertension: Secondary | ICD-10-CM | POA: Diagnosis not present

## 2022-10-21 DIAGNOSIS — G4730 Sleep apnea, unspecified: Secondary | ICD-10-CM | POA: Diagnosis not present

## 2022-10-21 DIAGNOSIS — E119 Type 2 diabetes mellitus without complications: Secondary | ICD-10-CM | POA: Diagnosis not present

## 2022-10-21 DIAGNOSIS — E785 Hyperlipidemia, unspecified: Secondary | ICD-10-CM | POA: Diagnosis not present

## 2022-10-21 DIAGNOSIS — R079 Chest pain, unspecified: Secondary | ICD-10-CM | POA: Diagnosis not present

## 2022-10-21 DIAGNOSIS — K219 Gastro-esophageal reflux disease without esophagitis: Secondary | ICD-10-CM | POA: Diagnosis not present

## 2022-10-22 ENCOUNTER — Encounter: Payer: Self-pay | Admitting: Student

## 2022-10-22 NOTE — Progress Notes (Signed)
Patient's pulmonologist Dr. Lorrene Reid at [facility] pulmonology called regarding the clarification I requested to patient's clearance. Per Dr. Lorrene Reid, patient's risk of pulmonary complication and risk of postoperative respiratory failure have not changed since her visit on 07/09/2022 and the risk remains low.

## 2022-11-04 DIAGNOSIS — R918 Other nonspecific abnormal finding of lung field: Secondary | ICD-10-CM | POA: Diagnosis not present

## 2022-11-04 DIAGNOSIS — U099 Post covid-19 condition, unspecified: Secondary | ICD-10-CM | POA: Diagnosis not present

## 2022-11-04 DIAGNOSIS — J9811 Atelectasis: Secondary | ICD-10-CM | POA: Diagnosis not present

## 2022-11-04 DIAGNOSIS — R942 Abnormal results of pulmonary function studies: Secondary | ICD-10-CM | POA: Diagnosis not present

## 2022-11-04 DIAGNOSIS — R0989 Other specified symptoms and signs involving the circulatory and respiratory systems: Secondary | ICD-10-CM | POA: Diagnosis not present

## 2022-11-04 DIAGNOSIS — R0602 Shortness of breath: Secondary | ICD-10-CM | POA: Diagnosis not present

## 2022-11-11 ENCOUNTER — Other Ambulatory Visit: Payer: Medicaid Other | Admitting: Obstetrics and Gynecology

## 2022-11-11 NOTE — Patient Instructions (Signed)
Hi Crystal Gibson, sorry to have missed you today, I hope you are doing okay - as a part of your Medicaid benefit, you are eligible for care management and care coordination services at no cost or copay. I was unable to reach you by phone today but would be happy to help you with your health related needs. Please feel free to call me at [phone].  A member of the Managed Medicaid care management team will reach out to you again over the next 30 business  days.   Kathi Der RN, BSN [place]  Triad Engineer, production - Managed Medicaid High Risk [phone]

## 2022-11-11 NOTE — Patient Outreach (Signed)
Care Coordination  11/11/2022  Crystal Gibson [birth year] [id number]   Medicaid Managed Care   Unsuccessful Outreach Note  11/11/2022 Name: Crystal Gibson MRN: [id number] DOB: 02/04/1971  Referred by: Fayrene Helper, NP Reason for referral : High Risk Managed Medicaid (Unsuccessful telephone outreach)  An unsuccessful telephone outreach was attempted today. The patient was referred to the case management team for assistance with care management and care coordination.   Follow Up Plan: The care management team will reach out to the patient again over the next 30 business  days.   Kathi Der RN, BSN [place]  Triad Engineer, production - Managed Medicaid High Risk [phone]

## 2022-12-10 ENCOUNTER — Telehealth: Payer: Self-pay

## 2022-12-10 NOTE — Telephone Encounter (Signed)
Received request from [facility] requesting recent notes on patient.  Faxed note from last office visit dated 09/02/2022

## 2022-12-12 ENCOUNTER — Other Ambulatory Visit: Payer: Medicaid Other | Admitting: Obstetrics and Gynecology

## 2022-12-12 NOTE — Patient Instructions (Signed)
Hi Ms. Archambeault, sorry to have missed you again today, I hope you and your family are doing okay - as a part of your Medicaid benefit, you are eligible for care management and care coordination services at no cost or copay. I was unable to reach you by phone today but would be happy to help you with your health related needs. Please feel free to call me at [phone].  A member of the Managed Medicaid care management team will reach out to you again over the next 30 business  days.   Kathi Der RN, BSN [place]  Triad Engineer, production - Managed Medicaid High Risk [phone].

## 2022-12-12 NOTE — Patient Outreach (Signed)
  Medicaid Managed Care   Unsuccessful Attempt Note   12/12/2022 Name: Crystal Gibson MRN: [id number] DOB: 1971-11-13  Referred by: Fayrene Helper, NP Reason for referral : High Risk Managed Medicaid (Unsuccessful telephone outreach)   A second unsuccessful telephone outreach was attempted today. The patient was referred to the case management team for assistance with care management and care coordination.    Follow Up Plan: The Managed Medicaid care management team will reach out to the patient again over the next 30 business  days. and The  Patient has been provided with contact information for the Managed Medicaid care management team and has been advised to call with any health related questions or concerns.    Kathi Der RN, BSN [place]  Triad Engineer, production - Managed Medicaid High Risk [phone]

## 2022-12-16 ENCOUNTER — Telehealth: Payer: Self-pay

## 2022-12-16 DIAGNOSIS — E785 Hyperlipidemia, unspecified: Secondary | ICD-10-CM | POA: Diagnosis not present

## 2022-12-16 DIAGNOSIS — G4730 Sleep apnea, unspecified: Secondary | ICD-10-CM | POA: Diagnosis not present

## 2022-12-16 DIAGNOSIS — E669 Obesity, unspecified: Secondary | ICD-10-CM | POA: Diagnosis not present

## 2022-12-16 DIAGNOSIS — E119 Type 2 diabetes mellitus without complications: Secondary | ICD-10-CM | POA: Diagnosis not present

## 2022-12-16 DIAGNOSIS — K219 Gastro-esophageal reflux disease without esophagitis: Secondary | ICD-10-CM | POA: Diagnosis not present

## 2022-12-16 DIAGNOSIS — I10 Essential (primary) hypertension: Secondary | ICD-10-CM | POA: Diagnosis not present

## 2022-12-16 DIAGNOSIS — I471 Supraventricular tachycardia, unspecified: Secondary | ICD-10-CM | POA: Diagnosis not present

## 2022-12-16 NOTE — Telephone Encounter (Signed)
..   Medicaid Managed Care Note  12/16/2022 Name: Crystal Gibson MRN: [id number] DOB: 12-20-1971  Crystal Gibson is a 51 y.o. year old female who is a primary care patient of Fayrene Helper, NP and is actively engaged with the care management team. I reached out to [name] by phone today to assist with re-scheduling a follow up visit with the RN Case Manager  Follow up plan: Unsuccessful telephone outreach attempt made. A HIPAA compliant phone message was left for the patient providing contact information and requesting a return call.  The care management team will reach out to the patient again over the next 7-10 days.    Weston Settle Care Guide, High Risk Medicaid Managed Care Embedded Care Coordination [facility]  [facility]

## 2022-12-17 DIAGNOSIS — R55 Syncope and collapse: Secondary | ICD-10-CM | POA: Diagnosis not present

## 2022-12-17 DIAGNOSIS — R079 Chest pain, unspecified: Secondary | ICD-10-CM | POA: Diagnosis not present

## 2022-12-17 DIAGNOSIS — R0602 Shortness of breath: Secondary | ICD-10-CM | POA: Diagnosis not present

## 2022-12-24 ENCOUNTER — Telehealth: Payer: Self-pay

## 2022-12-24 NOTE — Telephone Encounter (Signed)
..   Medicaid Managed Care Note  12/24/2022 Name: Crystal Gibson MRN: [id number] DOB: [birth year]  Crystal Gibson is a 51 y.o. year old female who is a primary care patient of Fayrene Helper, NP and is actively engaged with the care management team. I reached out to [name] by phone today to assist with re-scheduling a follow up visit with the RN Case Manager  Follow up plan: Unsuccessful telephone outreach attempt made. A HIPAA compliant phone message was left for the patient providing contact information and requesting a return call.  We have been unable to make contact with the patient for follow up. The care management team is available to follow up with the patient after provider conversation with the patient regarding recommendation for care management engagement and subsequent re-referral to the care management team.   Weston Settle Care Guide, High Risk Medicaid Managed Care Embedded Care Coordination [facility]  [facility]

## 2023-01-01 DIAGNOSIS — I10 Essential (primary) hypertension: Secondary | ICD-10-CM | POA: Diagnosis not present

## 2023-01-01 DIAGNOSIS — E1165 Type 2 diabetes mellitus with hyperglycemia: Secondary | ICD-10-CM | POA: Diagnosis not present

## 2023-01-01 DIAGNOSIS — E785 Hyperlipidemia, unspecified: Secondary | ICD-10-CM | POA: Diagnosis not present

## 2023-01-02 ENCOUNTER — Other Ambulatory Visit: Payer: Medicaid Other

## 2023-01-02 ENCOUNTER — Ambulatory Visit: Payer: Medicaid Other

## 2023-01-02 DIAGNOSIS — E1165 Type 2 diabetes mellitus with hyperglycemia: Secondary | ICD-10-CM | POA: Diagnosis not present

## 2023-01-02 DIAGNOSIS — E6609 Other obesity due to excess calories: Secondary | ICD-10-CM | POA: Diagnosis not present

## 2023-01-02 DIAGNOSIS — I10 Essential (primary) hypertension: Secondary | ICD-10-CM | POA: Diagnosis not present

## 2023-01-02 DIAGNOSIS — E785 Hyperlipidemia, unspecified: Secondary | ICD-10-CM | POA: Diagnosis not present

## 2023-01-14 DIAGNOSIS — E785 Hyperlipidemia, unspecified: Secondary | ICD-10-CM | POA: Diagnosis not present

## 2023-01-14 DIAGNOSIS — Z23 Encounter for immunization: Secondary | ICD-10-CM | POA: Diagnosis not present

## 2023-01-14 DIAGNOSIS — R079 Chest pain, unspecified: Secondary | ICD-10-CM | POA: Diagnosis not present

## 2023-01-14 DIAGNOSIS — Z794 Long term (current) use of insulin: Secondary | ICD-10-CM | POA: Diagnosis not present

## 2023-01-14 DIAGNOSIS — E119 Type 2 diabetes mellitus without complications: Secondary | ICD-10-CM | POA: Diagnosis not present

## 2023-01-14 DIAGNOSIS — R55 Syncope and collapse: Secondary | ICD-10-CM | POA: Diagnosis not present

## 2023-01-14 DIAGNOSIS — R0602 Shortness of breath: Secondary | ICD-10-CM | POA: Diagnosis not present

## 2023-01-14 DIAGNOSIS — E669 Obesity, unspecified: Secondary | ICD-10-CM | POA: Diagnosis not present

## 2023-01-14 DIAGNOSIS — Z6834 Body mass index (BMI) 34.0-34.9, adult: Secondary | ICD-10-CM | POA: Diagnosis not present

## 2023-01-14 DIAGNOSIS — G4733 Obstructive sleep apnea (adult) (pediatric): Secondary | ICD-10-CM | POA: Diagnosis not present

## 2023-01-22 ENCOUNTER — Other Ambulatory Visit: Payer: Medicaid Other | Admitting: Obstetrics and Gynecology

## 2023-01-22 NOTE — Patient Instructions (Signed)
Hi Ms. Crystal Gibson, I hope you are doing okay - as a part of your Medicaid benefit, you are eligible for care management and care coordination services at no cost or copay. I was unable to reach you by phone today but would be happy to help you with your health related needs. Please feel free to call me at [phone].  Aida Raider RN, BSN [place]  [facility] - Managed Medicaid High Risk [phone].

## 2023-01-22 NOTE — Patient Outreach (Cosign Needed)
Care Coordination  01/22/2023  OUIDA ABEYTA [birth year] [id number]   Medicaid Managed Care   Unsuccessful Outreach Note  01/22/2023 Name: KIMANI BEDOYA MRN: [id number] DOB: 10/07/1971  Referred by: [name], NP Reason for referral : High Risk Managed Medicaid (Unsuccessful telephone outreach)   Four  unsuccessful telephone outreaches  have been attempted. The patient was referred to the case management team for assistance with care management and care coordination. The patient's primary care provider has been notified of our unsuccessful attempts to make or maintain contact with the patient. The care management team is pleased to engage with this patient at any time in the future should he/she be interested in assistance from the care management team.   Follow Up Plan: The patient has been provided with contact information for the care management team and has been advised to call with any health related questions or concerns.  We have been unable to make contact with the patient for follow up. The care management team is available to follow up with the patient after provider conversation with the patient regarding recommendation for care management engagement and subsequent re-referral to the care management team.   Aida Raider RN, BSN [place] Management Coordinator - Managed [place] High Risk [phone]

## 2023-02-08 ENCOUNTER — Other Ambulatory Visit: Payer: Self-pay | Admitting: Cardiovascular Disease

## 2023-02-11 ENCOUNTER — Other Ambulatory Visit: Payer: Self-pay | Admitting: Nurse Practitioner

## 2023-02-11 ENCOUNTER — Other Ambulatory Visit: Payer: Self-pay

## 2023-02-11 ENCOUNTER — Encounter: Payer: Self-pay | Admitting: Plastic Surgery

## 2023-02-11 ENCOUNTER — Ambulatory Visit (INDEPENDENT_AMBULATORY_CARE_PROVIDER_SITE_OTHER): Payer: Medicaid Other | Admitting: Plastic Surgery

## 2023-02-11 VITALS — BP 138/88 | HR 90 | Ht 68.5 in | Wt 232.0 lb

## 2023-02-11 DIAGNOSIS — Z6835 Body mass index (BMI) 35.0-35.9, adult: Secondary | ICD-10-CM

## 2023-02-11 DIAGNOSIS — M793 Panniculitis, unspecified: Secondary | ICD-10-CM | POA: Diagnosis not present

## 2023-02-11 DIAGNOSIS — E1165 Type 2 diabetes mellitus with hyperglycemia: Secondary | ICD-10-CM | POA: Diagnosis not present

## 2023-02-11 DIAGNOSIS — R635 Abnormal weight gain: Secondary | ICD-10-CM | POA: Diagnosis not present

## 2023-02-11 MED ORDER — SUCRALFATE 1 G PO TABS: 1.0000 g | ORAL_TABLET | Freq: Four times a day (QID) | ORAL | 1 refills | Status: DC

## 2023-02-11 NOTE — Progress Notes (Signed)
Crystal Gibson is a very nice 52 year old lady who was previously seen and evaluated for panniculectomy by Dr. Erin Hearing.  After her evaluation he left the practice and she is now following up with me to discuss the procedure.  Unfortunately she has gained an additional 25 pounds since her last evaluation and currently is a weight of 232 pounds with a BMI of 35.  She is still interested in the panniculectomy but also the upper portion of the abdomen.  In my hands she is not a candidate for the upper portion of the abdomen as I do not believe that she will obtain an acceptable aesthetic result.  I have asked her to lose at least to 25 pounds that she has gained and any additional weight that she can.  Additionally she states that her blood glucose is often in the 400-500 range and her last reported hemoglobin A1c was 7.5 she will need to have her blood sugar under better control before we schedule surgery.  I have encouraged her to return to her primary care physician for assistance with managing her blood sugar.  Additionally she may be a candidate for some of the new weight loss medications.  She will follow-up with me in 2 months.

## 2023-02-12 ENCOUNTER — Other Ambulatory Visit: Payer: Self-pay

## 2023-02-16 ENCOUNTER — Other Ambulatory Visit: Payer: Self-pay

## 2023-02-16 MED ORDER — SUCRALFATE 1 G PO TABS: 1.0000 g | ORAL_TABLET | Freq: Four times a day (QID) | ORAL | 1 refills | Status: DC

## 2023-02-16 MED ORDER — FUROSEMIDE 20 MG PO TABS: 20.0000 mg | ORAL_TABLET | Freq: Every day | ORAL | 0 refills | Status: DC

## 2023-02-17 ENCOUNTER — Telehealth: Payer: Self-pay

## 2023-02-17 ENCOUNTER — Ambulatory Visit: Payer: Medicaid Other | Admitting: Cardiovascular Disease

## 2023-02-17 ENCOUNTER — Encounter: Payer: Self-pay | Admitting: Cardiovascular Disease

## 2023-02-17 VITALS — BP 130/90 | HR 102 | Ht 69.0 in | Wt 232.2 lb

## 2023-02-17 DIAGNOSIS — G4733 Obstructive sleep apnea (adult) (pediatric): Secondary | ICD-10-CM

## 2023-02-17 DIAGNOSIS — I2694 Multiple subsegmental pulmonary emboli without acute cor pulmonale: Secondary | ICD-10-CM

## 2023-02-17 DIAGNOSIS — R0602 Shortness of breath: Secondary | ICD-10-CM | POA: Diagnosis not present

## 2023-02-17 DIAGNOSIS — R0789 Other chest pain: Secondary | ICD-10-CM

## 2023-02-17 NOTE — Progress Notes (Signed)
Cardiology Office Note   Date:  02/18/2023   ID:  Crystal Gibson, DOB [birth year], MRN [id number]  PCP:  Evern Bio, NP  Cardiologist:  Neoma Laming, MD      History of Present Illness: Crystal Gibson is a 52 y.o. female who presents for  Chief Complaint  Patient presents with   Follow-up    2 month follow up    Chest Pain  This is a recurrent problem. The current episode started today. The onset quality is sudden. The problem occurs 2 to 4 times per day. The problem has been waxing and waning. The pain is present in the substernal region. The pain is at a severity of 4/10. The pain is moderate. The quality of the pain is described as pressure. The pain radiates to the left arm.      Past Medical History:  Diagnosis Date   Anxiety    Asthma    COVID 2021   Hospitalized with long Covid   Depression    Environmental allergies    Gestational diabetes    Pt is a type 2 diabetes   History of blood clots    when hospitalized for Salmonella poisoning and had Covid at the same time   Pericarditis 2021   while in hospital for Salmonella poisoning   Sleep apnea    does not use a cpap     Past Surgical History:  Procedure Laterality Date   ABDOMINAL HYSTERECTOMY     OOPHORECTOMY     WISDOM TOOTH EXTRACTION       Current Outpatient Medications  Medication Sig Dispense Refill   ACCU-CHEK FASTCLIX LANCETS MISC   2   albuterol (PROVENTIL HFA;VENTOLIN HFA) 108 (90 BASE) MCG/ACT inhaler Inhale 2 puffs into the lungs every 4 (four) hours as needed for wheezing or shortness of breath. 1 Inhaler 0   ARIPiprazole (ABILIFY) 15 MG tablet Take 15 mg by mouth daily.     baclofen (LIORESAL) 10 MG tablet Take 10 mg by mouth 3 (three) times daily.     BD PEN NEEDLE NANO 2ND GEN 32G X 4 MM MISC USE THREE TIMES DAILY BEFORE MEALS     Blood Glucose Monitoring Suppl (ACCU-CHEK GUIDE) w/Device KIT USE DEVICE TO CHECK SUGARS DAILY     Butalbital-APAP-Caffeine 50-300-40 MG CAPS TAKE 1  CAPSULE BY MOUTH EVERY 6 HOURS AS NEEDED FOR PERSISTENT HEADACHE 120 capsule 0   Continuous Blood Gluc Sensor (DEXCOM G6 SENSOR) MISC Apply 1 each topically as needed.     CORLANOR 5 MG TABS tablet Take 5 mg by mouth 2 (two) times daily.     digoxin (LANOXIN) 0.25 MG tablet Take 0.25 mg by mouth daily.     DULoxetine (CYMBALTA) 60 MG capsule Take 60 mg by mouth daily.  0   Evolocumab (REPATHA SURECLICK) [id number] MG/ML SOAJ Inject 140 mg into the skin every 14 (fourteen) days.     famotidine (PEPCID) 20 MG tablet Take 20 mg by mouth daily.     FARXIGA 10 MG TABS tablet Take 10 mg by mouth daily.     fluticasone (FLONASE) 50 MCG/ACT nasal spray Place 1 spray into both nostrils 2 (two) times daily. 16 g 0   furosemide (LASIX) 20 MG tablet Take 1 tablet (20 mg total) by mouth daily. 30 tablet 0   gabapentin (NEURONTIN) 300 MG capsule Take 1 capsule (300 mg total) by mouth 3 (three) times daily. 90 capsule 3   glucose blood (ACCU-CHEK GUIDE)  test strip Accu-Chek Guide In Vitro Strip QTY: 200 strip Days: 90 Refills: 1  Written: 04/27/20 Patient Instructions: TEST FASTING BLOOD SUGAR EVERY MORNING FASTING AND EVERY EVENING E11.65     glucose blood ([facility] BLOOD GLUCOSE TEST) test strip Use to check blood sugar as directed with insulin 3 times a day & for symptoms of high or low blood sugar.     icosapent Ethyl (VASCEPA) 1 g capsule Take 2 capsules by mouth 2 (two) times daily.     insulin degludec (TRESIBA) 200 UNIT/ML FlexTouch Pen Inject 48 Units into the skin daily.     Insulin Pen Needle (GLOBAL EASY GLIDE PEN NEEDLES) 32G X 4 MM MISC Use for injections three (3) times a day before meals.     Lancets Misc. (ACCU-CHEK FASTCLIX LANCET) KIT Accu-Chek Fastclix Lancet Drum  USE TO CHECK FASTING IN AM AND AS NEEDED FOR HIGH OR LOW     metoprolol succinate (TOPROL-XL) 100 MG 24 hr tablet Take 200 mg by mouth daily.     NOVOLOG FLEXPEN 100 UNIT/ML FlexPen Inject 10 Units into the skin 3 (three) times daily with  meals.     olmesartan (BENICAR) 5 MG tablet Take 5 mg by mouth daily.  1   ONETOUCH VERIO test strip 1 each 4 (four) times daily.     OXYGEN Inhale 2 L into the lungs as needed (exertion).     pantoprazole (PROTONIX) 40 MG tablet Take 40 mg by mouth daily.     potassium chloride (KLOR-CON M) 10 MEQ tablet Take 10 mEq by mouth daily.     rosuvastatin (CRESTOR) 40 MG tablet Take 40 mg by mouth daily.     Semaglutide (RYBELSUS) 7 MG TABS Take 1 tablet by mouth daily.     SYMBICORT 160-4.5 MCG/ACT inhaler Inhale 2 puffs into the lungs in the morning and at bedtime.     topiramate (TOPAMAX) 25 MG tablet Take 50 mg by mouth at bedtime.     traZODone (DESYREL) 100 MG tablet Take 200 mg by mouth at bedtime.      sucralfate (CARAFATE) 1 g tablet Take 1 tablet (1 g total) by mouth 4 (four) times daily. 120 tablet 1   No current facility-administered medications for this visit.    Allergies:   Glucophage [metformin]    Social History:   reports that she has never smoked. She has never used smokeless tobacco. She reports that she does not drink alcohol and does not use drugs.   Family History:  family history includes Breast cancer in her cousin, maternal aunt, maternal grandmother, paternal aunt, and sister.    ROS:     Review of Systems  Constitutional: Negative.   HENT: Negative.    Eyes: Negative.   Respiratory: Negative.    Cardiovascular:  Positive for chest pain.  Gastrointestinal: Negative.   Genitourinary: Negative.   Musculoskeletal: Negative.   Skin: Negative.   Neurological: Negative.   Endo/Heme/Allergies: Negative.   Psychiatric/Behavioral: Negative.    All other systems reviewed and are negative.     All other systems are reviewed and negative.    PHYSICAL EXAM: VS:  BP (!) 130/90   Pulse (!) 102   Ht 5' 9"$  (1.753 m)   Wt 232 lb 3.2 oz (105.3 kg)   SpO2 98%   BMI 34.29 kg/m  , BMI Body mass index is 34.29 kg/m. Last weight:  Wt Readings from Last 3  Encounters:  02/17/23 232 lb 3.2 oz (105.3 kg)  02/11/23 232 lb (105.2 kg)  04/09/22 232 lb (105.2 kg)     Physical Exam Constitutional:      Appearance: Normal appearance.  Cardiovascular:     Rate and Rhythm: Normal rate and regular rhythm.     Heart sounds: Normal heart sounds.  Pulmonary:     Effort: Pulmonary effort is normal.     Breath sounds: Normal breath sounds.  Musculoskeletal:     Right lower leg: No edema.     Left lower leg: No edema.  Neurological:     Mental Status: She is alert.       EKG:   Recent Labs: No results found for requested labs within last 365 days.    Lipid Panel No results found for: "CHOL", "TRIG", "HDL", "CHOLHDL", "VLDL", "LDLCALC", "LDLDIRECT"    REASON FOR VISIT  Referred by Dr.Blia Totman Humphrey Rolls.        TESTS  Imaging: Computed Tomographic Angiography:  Cardiac multidetector CT was performed paying particular attention to the coronary arteries for the diagnosis of: Chest Pain. Diagnostic Drugs:  Administered iohexol (Omnipaque) through an antecubital vein and images from the examination were analyzed for the presence and extent of coronary artery disease, using 3D image processing software. 100 mL of non-ionic contrast (Omnipaque) was used.        TEST CONCLUSIONS  Quality of study: Fair.  1-Calcium score: 0  2-Right dominant system.  3-Normal coronaries.      Neoma Laming MD  Electronically signed by: Neoma Laming     Date: 03/10/2022 13:19 REASON FOR VISIT  Visit for: Echocardiogram/Z 01.810  Sex:   Female   wt= 225   lbs.  BP=128/80  Height= 69   inches.        TESTS  Imaging: Echocardiogram:  An echocardiogram in (2-d) mode was performed and in Doppler mode with color flow velocity mapping was performed. The aortic valve cusps are abnormal 1.2    cm, flow velocity 1.30   m/s, and systolic calculated mean flow gradient 4   mmHg. Mitral valve diastolic peak flow velocity E .484    m/s and E/A ratio  0.7. Aortic root diameter 2.9    cm. The LVOT internal diameter 2.6  cm and flow velocity was abnormal .842   m/s. LV systolic dimension [id number]    cm, diastolic 3.7  cm, posterior wall thickness 1.13    cm, fractional shortening 38.6 %, and EF 69.9 %. IVS thickness 1.06  cm. LA dimension 3.9 cm. Pulmonic Valve has Trace Regurgitation. Tricuspid Valve has Trace Regurgitation.     ASSESSMENT  Technically adequate study.  Normal chamber sizes.  Normal left ventricular systolic function.  Mild left ventricular hypertrophy with GRADE 1 (relaxation abnormality) diastolic dysfunction.  Normal right ventricular systolic function.  Normal right ventricular diastolic function.  Normal left ventricular wall motion.  Normal right ventricular wall motion.  Trace pulmonary regurgitation.  Trace tricuspid regurgitation.  Normal pulmonary artery pressure.  No pericardial effusion.  Mild LVH.     THERAPY   Referring physician: Dionisio David  Sonographer: Neoma Laming.      Neoma Laming MD  Electronically signed by: Neoma Laming     Date: 03/17/2022 14:13   ASSESSMENT AND PLAN:    ICD-10-CM   1. Multiple subsegmental pulmonary emboli without acute cor pulmonale (HCC)  I26.94 PCV ECHOCARDIOGRAM COMPLETE    2. OSA (obstructive sleep apnea)  G47.33     3. Other chest pain  R07.89 MYOCARDIAL PERFUSION IMAGING  PCV ECHOCARDIOGRAM COMPLETE   keeps having chest pain, will repeat echo and stress test    4. SOB (shortness of breath)  R06.02 MYOCARDIAL PERFUSION IMAGING    PCV ECHOCARDIOGRAM COMPLETE       Problem List Items Addressed This Visit       Cardiovascular and Mediastinum   Multiple subsegmental pulmonary emboli without acute cor pulmonale ([place]) - Primary   Relevant Orders   PCV ECHOCARDIOGRAM COMPLETE     Respiratory   OSA (obstructive sleep apnea)   Other Visit Diagnoses     Other chest pain       keeps having chest pain, will repeat echo and stress test    Relevant Orders   MYOCARDIAL PERFUSION IMAGING   PCV ECHOCARDIOGRAM COMPLETE   SOB (shortness of breath)       Relevant Orders   MYOCARDIAL PERFUSION IMAGING   PCV ECHOCARDIOGRAM COMPLETE        Disposition:   Return in about 4 weeks (around 03/17/2023) for after echo and stress test.    Total time spent 30 minutes  Signed,  Neoma Laming, MD  02/18/2023 10:44 AM    [place]

## 2023-02-17 NOTE — Telephone Encounter (Signed)
Patient was in office today to see cardiology and she states that Dr Rosario Jacks is dropping her for Endocrinology and is asking if youd be willing to see her or if she needs to get referred somewhere else for this

## 2023-02-20 ENCOUNTER — Other Ambulatory Visit: Payer: Self-pay | Admitting: Nurse Practitioner

## 2023-02-24 DIAGNOSIS — M6530 Trigger finger, unspecified finger: Secondary | ICD-10-CM | POA: Diagnosis not present

## 2023-02-24 DIAGNOSIS — M79641 Pain in right hand: Secondary | ICD-10-CM | POA: Diagnosis not present

## 2023-02-26 ENCOUNTER — Ambulatory Visit (INDEPENDENT_AMBULATORY_CARE_PROVIDER_SITE_OTHER): Payer: Medicaid Other

## 2023-02-26 DIAGNOSIS — R0789 Other chest pain: Secondary | ICD-10-CM | POA: Diagnosis not present

## 2023-02-26 DIAGNOSIS — R0602 Shortness of breath: Secondary | ICD-10-CM | POA: Diagnosis not present

## 2023-02-26 MED ORDER — TECHNETIUM TC 99M SESTAMIBI GENERIC - CARDIOLITE
33.8000 | Freq: Once | INTRAVENOUS | Status: AC | PRN
  Administered 2023-02-26: 33.8 via INTRAVENOUS

## 2023-02-26 MED ORDER — TECHNETIUM TC 99M SESTAMIBI GENERIC - CARDIOLITE
9.5000 | Freq: Once | INTRAVENOUS | Status: AC | PRN
  Administered 2023-02-26: 9.5 via INTRAVENOUS

## 2023-03-05 ENCOUNTER — Other Ambulatory Visit: Payer: Self-pay | Admitting: Cardiovascular Disease

## 2023-03-05 ENCOUNTER — Ambulatory Visit: Payer: Medicaid Other | Admitting: Cardiovascular Disease

## 2023-03-05 DIAGNOSIS — I351 Nonrheumatic aortic (valve) insufficiency: Secondary | ICD-10-CM | POA: Diagnosis not present

## 2023-03-05 DIAGNOSIS — R0602 Shortness of breath: Secondary | ICD-10-CM

## 2023-03-05 DIAGNOSIS — R0789 Other chest pain: Secondary | ICD-10-CM

## 2023-03-05 DIAGNOSIS — I2694 Multiple subsegmental pulmonary emboli without acute cor pulmonale: Secondary | ICD-10-CM

## 2023-03-10 ENCOUNTER — Ambulatory Visit (INDEPENDENT_AMBULATORY_CARE_PROVIDER_SITE_OTHER): Payer: Medicaid Other | Admitting: Cardiovascular Disease

## 2023-03-10 ENCOUNTER — Encounter: Payer: Self-pay | Admitting: Cardiovascular Disease

## 2023-03-10 VITALS — BP 128/86 | HR 106 | Ht 68.0 in | Wt 226.4 lb

## 2023-03-10 DIAGNOSIS — R0789 Other chest pain: Secondary | ICD-10-CM | POA: Diagnosis not present

## 2023-03-10 DIAGNOSIS — G44001 Cluster headache syndrome, unspecified, intractable: Secondary | ICD-10-CM

## 2023-03-10 DIAGNOSIS — R42 Dizziness and giddiness: Secondary | ICD-10-CM

## 2023-03-10 NOTE — Progress Notes (Signed)
Cardiology Office Note   Date:  03/10/2023   ID:  Crystal Gibson, DOB [birth year], MRN [id number]  PCP:  Evern Bio, NP  Cardiologist:  Neoma Laming, MD      History of Present Illness: Crystal Gibson is a 52 y.o. female who presents for  Chief Complaint  Patient presents with   Follow-up    NST/Echo results    Headache  This is a new problem. The current episode started yesterday.  Chest Pain  This is a new problem. The current episode started today. The onset quality is sudden. The problem has been rapidly worsening. The pain is at a severity of 3/10. Associated symptoms include headaches.      Past Medical History:  Diagnosis Date   Anxiety    Asthma    COVID 2021   Hospitalized with long Covid   Depression    Environmental allergies    Gestational diabetes    Pt is a type 2 diabetes   History of blood clots    when hospitalized for Salmonella poisoning and had Covid at the same time   Pericarditis 2021   while in hospital for Salmonella poisoning   Sleep apnea    does not use a cpap     Past Surgical History:  Procedure Laterality Date   ABDOMINAL HYSTERECTOMY     OOPHORECTOMY     WISDOM TOOTH EXTRACTION       Current Outpatient Medications  Medication Sig Dispense Refill   ACCU-CHEK FASTCLIX LANCETS MISC   2   ADVAIR HFA 115-21 MCG/ACT inhaler SMARTSIG:2 Puff(s) By Mouth Twice Daily     ARIPiprazole (ABILIFY) 15 MG tablet Take 15 mg by mouth daily.     baclofen (LIORESAL) 10 MG tablet Take 10 mg by mouth 3 (three) times daily.     BD PEN NEEDLE NANO 2ND GEN 32G X 4 MM MISC USE THREE TIMES DAILY BEFORE MEALS     Blood Glucose Monitoring Suppl (ACCU-CHEK GUIDE) w/Device KIT USE DEVICE TO CHECK SUGARS DAILY     Butalbital-APAP-Caffeine 50-300-40 MG CAPS TAKE 1 CAPSULE BY MOUTH EVERY 6 HOURS AS NEEDED FOR PERSISTENT HEADACHE 120 capsule 0   Continuous Blood Gluc Sensor (DEXCOM G6 SENSOR) MISC Apply 1 each topically as needed.     CORLANOR 5 MG  TABS tablet Take 5 mg by mouth 2 (two) times daily.     digoxin (LANOXIN) 0.25 MG tablet Take 0.25 mg by mouth daily.     DULoxetine (CYMBALTA) 60 MG capsule Take 60 mg by mouth daily.  0   Evolocumab (REPATHA SURECLICK) [id number] MG/ML SOAJ INJECT 1 PEN UNDER THE SKIN EVERY 14 DAYS 6 mL 1   famotidine (PEPCID) 20 MG tablet Take 20 mg by mouth daily.     FARXIGA 10 MG TABS tablet Take 10 mg by mouth daily.     fluticasone (FLONASE) 50 MCG/ACT nasal spray Place 1 spray into both nostrils 2 (two) times daily. 16 g 0   furosemide (LASIX) 20 MG tablet Take 1 tablet (20 mg total) by mouth daily. 30 tablet 0   gabapentin (NEURONTIN) 300 MG capsule Take 1 capsule (300 mg total) by mouth 3 (three) times daily. 90 capsule 3   glucose blood (ACCU-CHEK GUIDE) test strip Accu-Chek Guide In Vitro Strip QTY: 200 strip Days: 90 Refills: 1  Written: 04/27/20 Patient Instructions: TEST FASTING BLOOD SUGAR EVERY MORNING FASTING AND EVERY EVENING E11.65     icosapent Ethyl (VASCEPA) 1 g capsule  Take 2 capsules by mouth 2 (two) times daily.     insulin degludec (TRESIBA) 200 UNIT/ML FlexTouch Pen Inject 48 Units into the skin daily.     Insulin Pen Needle (GLOBAL EASY GLIDE PEN NEEDLES) 32G X 4 MM MISC Use for injections three (3) times a day before meals.     Lancets Misc. (ACCU-CHEK FASTCLIX LANCET) KIT Accu-Chek Fastclix Lancet Drum  USE TO CHECK FASTING IN AM AND AS NEEDED FOR HIGH OR LOW     metoprolol succinate (TOPROL-XL) 100 MG 24 hr tablet Take 200 mg by mouth daily.     NOVOLOG FLEXPEN 100 UNIT/ML FlexPen Inject 10 Units into the skin 3 (three) times daily with meals.     olmesartan (BENICAR) 5 MG tablet Take 5 mg by mouth daily.  1   ONETOUCH VERIO test strip 1 each 4 (four) times daily.     OXYGEN Inhale 2 L into the lungs as needed (exertion).     pantoprazole (PROTONIX) 40 MG tablet Take 40 mg by mouth daily.     potassium chloride (KLOR-CON M) 10 MEQ tablet Take 10 mEq by mouth daily.     rosuvastatin  (CRESTOR) 40 MG tablet Take 40 mg by mouth daily.     Semaglutide (RYBELSUS) 7 MG TABS TAKE 1 TABLET BY MOUTH EVERY MORNING ON EMPTY STOMACH WITH SIP OF WATER 30 MINUTES PRIOR TO OTHER MEDICINES OR BREAKFAST 30 tablet 1   sucralfate (CARAFATE) 1 g tablet Take 1 tablet (1 g total) by mouth 4 (four) times daily. 120 tablet 1   SYMBICORT 160-4.5 MCG/ACT inhaler Inhale 2 puffs into the lungs in the morning and at bedtime.     topiramate (TOPAMAX) 25 MG tablet Take 50 mg by mouth at bedtime.     traZODone (DESYREL) 100 MG tablet Take 200 mg by mouth at bedtime.      albuterol (PROVENTIL HFA;VENTOLIN HFA) 108 (90 BASE) MCG/ACT inhaler Inhale 2 puffs into the lungs every 4 (four) hours as needed for wheezing or shortness of breath. (Patient not taking: Reported on 03/10/2023) 1 Inhaler 0   glucose blood ([facility] BLOOD GLUCOSE TEST) test strip Use to check blood sugar as directed with insulin 3 times a day & for symptoms of high or low blood sugar. (Patient not taking: Reported on 03/10/2023)     No current facility-administered medications for this visit.    Allergies:   Glucophage [metformin]    Social History:   reports that she has never smoked. She has never used smokeless tobacco. She reports that she does not drink alcohol and does not use drugs.   Family History:  family history includes Breast cancer in her cousin, maternal aunt, maternal grandmother, paternal aunt, and sister.    ROS:     Review of Systems  Constitutional: Negative.   HENT: Negative.    Eyes: Negative.   Respiratory: Negative.    Cardiovascular:  Positive for chest pain.  Gastrointestinal: Negative.   Genitourinary: Negative.   Musculoskeletal: Negative.   Skin: Negative.   Neurological:  Positive for headaches.  Endo/Heme/Allergies: Negative.   Psychiatric/Behavioral: Negative.    All other systems reviewed and are negative.     All other systems are reviewed and negative.    PHYSICAL EXAM: VS:  BP 128/86    Pulse (!) 106   Ht '5\' 8"'$  (1.727 m)   Wt 226 lb 6.4 oz (102.7 kg)   SpO2 95% Comment: 4L  BMI 34.42 kg/m  , BMI Body  mass index is 34.42 kg/m. Last weight:  Wt Readings from Last 3 Encounters:  03/10/23 226 lb 6.4 oz (102.7 kg)  02/17/23 232 lb 3.2 oz (105.3 kg)  02/11/23 232 lb (105.2 kg)     Physical Exam Constitutional:      Appearance: Normal appearance.  Cardiovascular:     Rate and Rhythm: Normal rate and regular rhythm.     Heart sounds: Normal heart sounds.  Pulmonary:     Effort: Pulmonary effort is normal.     Breath sounds: Normal breath sounds.  Musculoskeletal:     Right lower leg: No edema.     Left lower leg: No edema.  Neurological:     Mental Status: She is alert.       EKG:   Recent Labs: No results found for requested labs within last 365 days.    Lipid Panel No results found for: "CHOL", "TRIG", "HDL", "CHOLHDL", "VLDL", "LDLCALC", "LDLDIRECT"    Other studies Reviewed: Additional studies/ records that were reviewed today include:  Review of the above records demonstrates:       No data to display            ASSESSMENT AND PLAN:    ICD-10-CM   1. Chest pain, non-cardiac  R07.89    Had normal coronaries and ca score 0. Stress test now was normal and echo was unchanged with normal LVEF    2. Dizziness  R42     3. Intractable cluster headache syndrome, unspecified chronicity pattern  G44.001        Problem List Items Addressed This Visit   None Visit Diagnoses     Chest pain, non-cardiac    -  Primary   Had normal coronaries and ca score 0. Stress test now was normal and echo was unchanged with normal LVEF   Dizziness       Intractable cluster headache syndrome, unspecified chronicity pattern              Disposition:   Return in about 3 months (around 06/10/2023).    Total time spent: 30 minutes  Signed,  Neoma Laming, MD  03/10/2023 9:55 AM    [place]

## 2023-03-11 DIAGNOSIS — M65331 Trigger finger, right middle finger: Secondary | ICD-10-CM | POA: Diagnosis not present

## 2023-03-11 DIAGNOSIS — M65341 Trigger finger, right ring finger: Secondary | ICD-10-CM | POA: Diagnosis not present

## 2023-03-12 NOTE — Progress Notes (Signed)
Closing encounter for provider.   

## 2023-03-14 ENCOUNTER — Other Ambulatory Visit: Payer: Self-pay | Admitting: Cardiovascular Disease

## 2023-03-14 DIAGNOSIS — I4711 Inappropriate sinus tachycardia, so stated: Secondary | ICD-10-CM

## 2023-03-16 ENCOUNTER — Other Ambulatory Visit: Payer: Self-pay | Admitting: Nurse Practitioner

## 2023-03-16 ENCOUNTER — Ambulatory Visit: Payer: Medicaid Other | Admitting: Internal Medicine

## 2023-03-16 ENCOUNTER — Encounter: Payer: Self-pay | Admitting: Internal Medicine

## 2023-03-16 VITALS — BP 128/70 | HR 81 | Ht 68.5 in | Wt 224.0 lb

## 2023-03-16 DIAGNOSIS — H00011 Hordeolum externum right upper eyelid: Secondary | ICD-10-CM

## 2023-03-16 DIAGNOSIS — L819 Disorder of pigmentation, unspecified: Secondary | ICD-10-CM | POA: Diagnosis not present

## 2023-03-16 DIAGNOSIS — H109 Unspecified conjunctivitis: Secondary | ICD-10-CM

## 2023-03-16 DIAGNOSIS — E119 Type 2 diabetes mellitus without complications: Secondary | ICD-10-CM | POA: Insufficient documentation

## 2023-03-16 DIAGNOSIS — L658 Other specified nonscarring hair loss: Secondary | ICD-10-CM | POA: Diagnosis not present

## 2023-03-16 DIAGNOSIS — Z794 Long term (current) use of insulin: Secondary | ICD-10-CM

## 2023-03-16 DIAGNOSIS — E1165 Type 2 diabetes mellitus with hyperglycemia: Secondary | ICD-10-CM

## 2023-03-16 HISTORY — DX: Unspecified conjunctivitis: H10.9

## 2023-03-16 MED ORDER — RYBELSUS 14 MG PO TABS: 1.0000 | ORAL_TABLET | Freq: Every day | ORAL | 6 refills | Status: DC

## 2023-03-16 MED ORDER — POLYMYXIN B-TRIMETHOPRIM 10000-0.1 UNIT/ML-% OP SOLN: 2.0000 [drp] | Freq: Four times a day (QID) | OPHTHALMIC | 0 refills | Status: AC

## 2023-03-16 MED ORDER — BACITRACIN-POLYMYXIN B 500-10000 UNIT/GM OP OINT: TOPICAL_OINTMENT | Freq: Every day | OPHTHALMIC | 0 refills | Status: AC

## 2023-03-16 NOTE — Progress Notes (Signed)
Established Patient Office Visit  Subjective:  Patient ID: Crystal Gibson, female    DOB: 05/06/1971  Age: 52 y.o. MRN: [id number]  Chief Complaint  Patient presents with   Follow-up    Right pink eye    Patient comes in with 3-day history of her right upper eyelid swelling and discomfort, with a yellow discharge from her right eye and crustiness in the morning.  Patient reports that 2 of her family members are suffering from conjunctivitis  and she thinks she has picked it up from them.  Patient is a diabetic and she is concerned about her eye infection. She denies any photophobia, no visual disturbance.  She does have some headache but no fevers or chills.  On exam there is no significant redness of her right eye but she does have redness and swelling of her right upper eyelid. Will prescribe antibiotic eyedrops for daytime use and antibiotic cream for bedtime. Patient also requested dermatology referral for acne and hyperpigmentation of her skin, as well as hair loss. Patient is taking her medications regularly and has lost some weight on Rybelsus 7 mg as well.  She is tolerating it well and will increase the dose to 14 mg/day. At her next follow-up , consider reducing the dose of Tresiba if needed.    No other concerns at this time.   Past Medical History:  Diagnosis Date   Anxiety    Asthma    COVID 2021   Hospitalized with long Covid   Depression    Environmental allergies    Gestational diabetes    Pt is a type 2 diabetes   History of blood clots    when hospitalized for Salmonella poisoning and had Covid at the same time   Pericarditis 2021   while in hospital for Salmonella poisoning   Sleep apnea    does not use a cpap    Past Surgical History:  Procedure Laterality Date   ABDOMINAL HYSTERECTOMY     OOPHORECTOMY     WISDOM TOOTH EXTRACTION      Social History   Socioeconomic History   Marital status: Single    Spouse name: Not on file   Number of  children: Not on file   Years of education: Not on file   Highest education level: Not on file  Occupational History   Not on file  Tobacco Use   Smoking status: Never   Smokeless tobacco: Never  Vaping Use   Vaping Use: Never used  Substance and Sexual Activity   Alcohol use: No   Drug use: No   Sexual activity: Yes    Birth control/protection: Surgical  Other Topics Concern   Not on file  Social History Narrative   Not on file   Social Determinants of Health   Financial Resource Strain: High Risk (08/06/2022)   Overall Financial Resource Strain (CARDIA)    Difficulty of Paying Living Expenses: Hard  Food Insecurity: Food Insecurity Present (08/06/2022)   Hunger Vital Sign    Worried About Running Out of Food in the Last Year: Sometimes true    Ran Out of Food in the Last Year: Sometimes true  Transportation Needs: No Transportation Needs (10/13/2022)   PRAPARE - Hydrologist (Medical): No    Lack of Transportation (Non-Medical): No  Physical Activity: Inactive (09/08/2022)   Exercise Vital Sign    Days of Exercise per Week: 0 days    Minutes of Exercise per Session:  0 min  Stress: Stress Concern Present (06/24/2022)   [place]    Feeling of Stress : To some extent  Social Connections: Socially Isolated (07/30/2022)   Social Connection and Isolation Panel [NHANES]    Frequency of Communication with Friends and Family: More than three times a week    Frequency of Social Gatherings with Friends and Family: More than three times a week    Attends Religious Services: Never    Marine scientist or Organizations: No    Attends Archivist Meetings: Never    Marital Status: Never married  Intimate Partner Violence: Not At Risk (10/13/2022)   Humiliation, Afraid, Rape, and Kick questionnaire    Fear of Current or Ex-Partner: No    Emotionally Abused: No    Physically  Abused: No    Sexually Abused: No    Family History  Problem Relation Age of Onset   Breast cancer Sister    Breast cancer Maternal Aunt    Breast cancer Paternal Aunt    Breast cancer Maternal Grandmother    Breast cancer Cousin     Allergies  Allergen Reactions   Glucophage [Metformin] Nausea And Vomiting    Review of Systems  Constitutional: Negative.   HENT: Negative.    Eyes:  Positive for pain, discharge and redness. Negative for blurred vision, double vision and photophobia.  Respiratory: Negative.    Cardiovascular: Negative.   Gastrointestinal: Negative.   Genitourinary: Negative.   Musculoskeletal: Negative.   Skin: Negative.   Neurological: Negative.   Psychiatric/Behavioral: Negative.         Objective:   BP 128/70   Pulse 81   Ht 5' 8.5" (1.74 m)   Wt 224 lb (101.6 kg)   SpO2 97%   BMI 33.56 kg/m   Vitals:   03/16/23 1256  BP: 128/70  Pulse: 81  Height: 5' 8.5" (1.74 m)  Weight: 224 lb (101.6 kg)  SpO2: 97%  BMI (Calculated): 33.56    Physical Exam Constitutional:      Appearance: Normal appearance. She is obese.  Eyes:     General:        Right eye: Discharge present.     Comments: Swelling and redness of upper eyelid.  Cardiovascular:     Rate and Rhythm: Normal rate and regular rhythm.     Pulses: Normal pulses.     Heart sounds: Normal heart sounds.  Pulmonary:     Effort: Pulmonary effort is normal.     Breath sounds: Normal breath sounds.  Abdominal:     General: Abdomen is flat. Bowel sounds are normal.     Palpations: Abdomen is soft.  Musculoskeletal:        General: Normal range of motion.     Cervical back: Normal range of motion.  Neurological:     General: No focal deficit present.     Mental Status: She is alert and oriented to person, place, and time.  Psychiatric:        Mood and Affect: Mood normal.        Behavior: Behavior normal.      No results found for any visits on 03/16/23.  No results found  for this or any previous visit (from the past 2160 hour(s)).    Assessment & Plan:  Patient to start using antibiotic drops and ointment.  Increase dose of Rybelsus to 14 mg/day.  Patient advised to monitor her blood sugars  very carefully now. Set up dermatology referral for hyperpigmentation and hair loss. Problem List Items Addressed This Visit     Bacterial conjunctivitis of right eye - Primary   Relevant Medications   trimethoprim-polymyxin b (POLYTRIM) ophthalmic solution   bacitracin-polymyxin b (POLYSPORIN) ophthalmic ointment   Type 2 diabetes mellitus with hyperglycemia, with long-term current use of insulin (HCC)   Relevant Medications   Semaglutide (RYBELSUS) 14 MG TABS   Hyperpigmentation of skin   Relevant Orders   Ambulatory referral to Dermatology   Female pattern hair loss   Relevant Orders   Ambulatory referral to Dermatology   Other Visit Diagnoses     Hordeolum externum of right upper eyelid           Return in about 10 days (around 03/26/2023).   Total time spent: 30 minutes  Perrin Maltese, MD  03/16/2023

## 2023-03-25 ENCOUNTER — Other Ambulatory Visit: Payer: Self-pay

## 2023-03-26 ENCOUNTER — Encounter: Payer: Self-pay | Admitting: Nurse Practitioner

## 2023-03-26 ENCOUNTER — Ambulatory Visit: Payer: Medicaid Other | Admitting: Nurse Practitioner

## 2023-03-26 VITALS — BP 130/80 | HR 100 | Ht 68.5 in | Wt 219.2 lb

## 2023-03-26 DIAGNOSIS — L709 Acne, unspecified: Secondary | ICD-10-CM | POA: Diagnosis not present

## 2023-03-26 DIAGNOSIS — E1165 Type 2 diabetes mellitus with hyperglycemia: Secondary | ICD-10-CM

## 2023-03-26 DIAGNOSIS — H1089 Other conjunctivitis: Secondary | ICD-10-CM

## 2023-03-26 DIAGNOSIS — E782 Mixed hyperlipidemia: Secondary | ICD-10-CM | POA: Diagnosis not present

## 2023-03-26 DIAGNOSIS — R5383 Other fatigue: Secondary | ICD-10-CM | POA: Diagnosis not present

## 2023-03-26 DIAGNOSIS — E119 Type 2 diabetes mellitus without complications: Secondary | ICD-10-CM

## 2023-03-26 DIAGNOSIS — Z794 Long term (current) use of insulin: Secondary | ICD-10-CM

## 2023-03-26 DIAGNOSIS — Z1231 Encounter for screening mammogram for malignant neoplasm of breast: Secondary | ICD-10-CM

## 2023-03-26 LAB — GLUCOSE, POCT (MANUAL RESULT ENTRY): POC Glucose: 497 mg/dl — AB (ref 70–99)

## 2023-03-26 MED ORDER — NOVOLOG FLEXPEN 100 UNIT/ML ~~LOC~~ SOPN: 10.0000 [IU] | PEN_INJECTOR | Freq: Three times a day (TID) | SUBCUTANEOUS | 11 refills | Status: DC

## 2023-03-26 MED ORDER — DOXYCYCLINE HYCLATE 100 MG PO TABS: 100.0000 mg | ORAL_TABLET | Freq: Two times a day (BID) | ORAL | 3 refills | Status: DC

## 2023-03-26 MED ORDER — GLOBAL EASY GLIDE PEN NEEDLES 32G X 4 MM MISC: 4.0000 | Freq: Four times a day (QID) | 5 refills | Status: AC

## 2023-03-26 MED ORDER — DOXYCYCLINE & BENZOYL PEROXIDE 30 X 100 MG & 4.4% CO THPK: 5.0000 mL | PACK | Freq: Every evening | 5 refills | Status: DC

## 2023-03-26 MED ORDER — INSULIN DEGLUDEC 200 UNIT/ML ~~LOC~~ SOPN: 48.0000 [IU] | PEN_INJECTOR | Freq: Every day | SUBCUTANEOUS | 3 refills | Status: DC

## 2023-03-26 MED ORDER — NURTEC 75 MG PO TBDP: 75.0000 mg | ORAL_TABLET | ORAL | 3 refills | Status: DC

## 2023-03-26 NOTE — Patient Instructions (Addendum)
1) Refills 2) Follow up appt in 3 months, 2 weeks, fasting labs prior 3) Schedule mammo screen 4) Miralax given to patient today

## 2023-03-26 NOTE — Progress Notes (Signed)
Established Patient Office Visit  Subjective:  Patient ID: Crystal Gibson, female    DOB: [birth year]  Age: 52 y.o. MRN: [id number]  Chief Complaint  Patient presents with   Headache   Follow-up    10 day follow up    Follow up, will obtain labwork today, refills today.  Headache     No other concerns at this time.   Past Medical History:  Diagnosis Date   Anxiety    Asthma    COVID 2021   Hospitalized with long Covid   Depression    Environmental allergies    Gestational diabetes    Pt is a type 2 diabetes   History of blood clots    when hospitalized for Salmonella poisoning and had Covid at the same time   Pericarditis 2021   while in hospital for Salmonella poisoning   Sleep apnea    does not use a cpap    Past Surgical History:  Procedure Laterality Date   ABDOMINAL HYSTERECTOMY     OOPHORECTOMY     WISDOM TOOTH EXTRACTION      Social History   Socioeconomic History   Marital status: Single    Spouse name: Not on file   Number of children: Not on file   Years of education: Not on file   Highest education level: Not on file  Occupational History   Not on file  Tobacco Use   Smoking status: Never   Smokeless tobacco: Never  Vaping Use   Vaping Use: Never used  Substance and Sexual Activity   Alcohol use: No   Drug use: No   Sexual activity: Yes    Birth control/protection: Surgical  Other Topics Concern   Not on file  Social History Narrative   Not on file   Social Determinants of Health   Financial Resource Strain: High Risk (08/06/2022)   Overall Financial Resource Strain (CARDIA)    Difficulty of Paying Living Expenses: Hard  Food Insecurity: Food Insecurity Present (08/06/2022)   Hunger Vital Sign    Worried About Running Out of Food in the Last Year: Sometimes true    Ran Out of Food in the Last Year: Sometimes true  Transportation Needs: No Transportation Needs (10/13/2022)   PRAPARE - Hydrologist  (Medical): No    Lack of Transportation (Non-Medical): No  Physical Activity: Inactive (09/08/2022)   Exercise Vital Sign    Days of Exercise per Week: 0 days    Minutes of Exercise per Session: 0 min  Stress: Stress Concern Present (06/24/2022)   [place]    Feeling of Stress : To some extent  Social Connections: Socially Isolated (07/30/2022)   Social Connection and Isolation Panel [NHANES]    Frequency of Communication with Friends and Family: More than three times a week    Frequency of Social Gatherings with Friends and Family: More than three times a week    Attends Religious Services: Never    Marine scientist or Organizations: No    Attends Archivist Meetings: Never    Marital Status: Never married  Intimate Partner Violence: Not At Risk (10/13/2022)   Humiliation, Afraid, Rape, and Kick questionnaire    Fear of Current or Ex-Partner: No    Emotionally Abused: No    Physically Abused: No    Sexually Abused: No    Family History  Problem Relation Age of Onset  Breast cancer Sister    Breast cancer Maternal Aunt    Breast cancer Paternal Aunt    Breast cancer Maternal Grandmother    Breast cancer Cousin     Allergies  Allergen Reactions   Glucophage [Metformin] Nausea And Vomiting    Review of Systems  Constitutional: Negative.   HENT: Negative.    Eyes: Negative.   Respiratory: Negative.    Cardiovascular: Negative.   Gastrointestinal: Negative.   Genitourinary: Negative.   Musculoskeletal: Negative.   Skin: Negative.   Neurological:  Positive for headaches.  Endo/Heme/Allergies: Negative.   Psychiatric/Behavioral: Negative.         Objective:   BP 130/80   Pulse 100   Ht 5' 8.5" (1.74 m)   Wt 219 lb 3.2 oz (99.4 kg)   SpO2 98%   BMI 32.84 kg/m   Vitals:   03/26/23 0852  BP: 130/80  Pulse: 100  Height: 5' 8.5" (1.74 m)  Weight: 219 lb 3.2 oz (99.4 kg)  SpO2:  98%  BMI (Calculated): 32.84    Physical Exam Vitals reviewed.  Constitutional:      Appearance: She is well-developed.  HENT:     Head: Normocephalic.     Mouth/Throat:     Mouth: Mucous membranes are moist.  Cardiovascular:     Rate and Rhythm: Normal rate and regular rhythm.  Pulmonary:     Effort: Pulmonary effort is normal.     Breath sounds: Normal breath sounds.  Abdominal:     General: Bowel sounds are normal.  Musculoskeletal:        General: Normal range of motion.     Cervical back: Normal range of motion.  Skin:    General: Skin is warm and dry.  Neurological:     Mental Status: She is alert and oriented to person, place, and time.  Psychiatric:        Mood and Affect: Mood normal.        Behavior: Behavior normal.      Results for orders placed or performed in visit on 03/26/23  POCT Glucose (CBG)  Result Value Ref Range   POC Glucose 497 (A) 70 - 99 mg/dl    Recent Results (from the past 2160 hour(s))  POCT Glucose (CBG)     Status: Abnormal   Collection Time: 03/26/23  8:59 AM  Result Value Ref Range   POC Glucose 497 (A) 70 - 99 mg/dl      Assessment & Plan:   Problem List Items Addressed This Visit       Endocrine   Type 2 diabetes mellitus without complication, with long-term current use of insulin (HCC) - Primary   Relevant Medications   insulin degludec (TRESIBA) 200 UNIT/ML FlexTouch Pen   Insulin Pen Needle (GLOBAL EASY GLIDE PEN NEEDLES) 32G X 4 MM MISC   NOVOLOG FLEXPEN 100 UNIT/ML FlexPen   Rimegepant Sulfate (NURTEC) 75 MG TBDP   Other Relevant Orders   POCT Glucose (CBG) (Completed)   Hemoglobin A1c   CMP14+EGFR   Lipid panel     Other   Fatigue   Relevant Orders   TSH   Mixed hyperlipidemia    No follow-ups on file.   Total time spent: 35 minutes  Evern Bio, NP  03/26/2023

## 2023-03-27 LAB — CMP14+EGFR
ALT: 36 IU/L — ABNORMAL HIGH (ref 0–32)
AST: 20 IU/L (ref 0–40)
Albumin/Globulin Ratio: 1.5 (ref 1.2–2.2)
Albumin: 4.7 g/dL (ref 3.8–4.9)
Alkaline Phosphatase: 110 IU/L (ref 44–121)
BUN/Creatinine Ratio: 9 (ref 9–23)
BUN: 9 mg/dL (ref 6–24)
Bilirubin Total: 0.3 mg/dL (ref 0.0–1.2)
CO2: 22 mmol/L (ref 20–29)
Calcium: 9.7 mg/dL (ref 8.7–10.2)
Chloride: 95 mmol/L — ABNORMAL LOW (ref 96–106)
Creatinine, Ser: 0.98 mg/dL (ref 0.57–1.00)
Globulin, Total: 3.2 g/dL (ref 1.5–4.5)
Glucose: 451 mg/dL — ABNORMAL HIGH (ref 70–99)
Potassium: 4.8 mmol/L (ref 3.5–5.2)
Sodium: 134 mmol/L (ref 134–144)
Total Protein: 7.9 g/dL (ref 6.0–8.5)
eGFR: 70 mL/min/{1.73_m2} (ref >59)

## 2023-03-27 LAB — LIPID PANEL
Chol/HDL Ratio: 7.2 ratio — ABNORMAL HIGH (ref 0.0–4.4)
Cholesterol, Total: 322 mg/dL — ABNORMAL HIGH (ref 100–199)
HDL: 45 mg/dL (ref >39)
LDL Chol Calc (NIH): 208 mg/dL — ABNORMAL HIGH (ref 0–99)
Triglycerides: 335 mg/dL — ABNORMAL HIGH (ref 0–149)
VLDL Cholesterol Cal: 69 mg/dL — ABNORMAL HIGH (ref 5–40)

## 2023-03-27 LAB — TSH: TSH: 0.545 u[IU]/mL (ref 0.450–4.500)

## 2023-03-27 LAB — HEMOGLOBIN A1C
Est. average glucose Bld gHb Est-mCnc: 303 mg/dL
Hgb A1c MFr Bld: 12.2 % — ABNORMAL HIGH (ref 4.8–5.6)

## 2023-03-31 ENCOUNTER — Other Ambulatory Visit: Payer: Self-pay | Admitting: Cardiovascular Disease

## 2023-03-31 ENCOUNTER — Other Ambulatory Visit: Payer: Self-pay | Admitting: Nurse Practitioner

## 2023-04-09 ENCOUNTER — Other Ambulatory Visit: Payer: Self-pay

## 2023-04-09 DIAGNOSIS — Z794 Long term (current) use of insulin: Secondary | ICD-10-CM

## 2023-04-09 MED ORDER — REPATHA SURECLICK 140 MG/ML ~~LOC~~ SOAJ: SUBCUTANEOUS | 3 refills | Status: DC

## 2023-04-09 MED ORDER — NURTEC 75 MG PO TBDP
75.0000 mg | ORAL_TABLET | ORAL | 3 refills | Status: DC
Start: 2023-04-09 — End: 2024-02-05

## 2023-04-11 ENCOUNTER — Other Ambulatory Visit: Payer: Self-pay | Admitting: Cardiovascular Disease

## 2023-04-11 ENCOUNTER — Other Ambulatory Visit: Payer: Self-pay | Admitting: Nurse Practitioner

## 2023-04-11 DIAGNOSIS — I4711 Inappropriate sinus tachycardia, so stated: Secondary | ICD-10-CM

## 2023-04-13 ENCOUNTER — Encounter: Payer: Self-pay | Admitting: Plastic Surgery

## 2023-04-13 ENCOUNTER — Ambulatory Visit (INDEPENDENT_AMBULATORY_CARE_PROVIDER_SITE_OTHER): Payer: Medicaid Other | Admitting: Plastic Surgery

## 2023-04-13 ENCOUNTER — Other Ambulatory Visit: Payer: Self-pay

## 2023-04-13 VITALS — Ht 69.0 in | Wt 224.0 lb

## 2023-04-13 DIAGNOSIS — E65 Localized adiposity: Secondary | ICD-10-CM

## 2023-04-13 MED ORDER — PANTOPRAZOLE SODIUM 40 MG PO TBEC: 40.0000 mg | DELAYED_RELEASE_TABLET | Freq: Two times a day (BID) | ORAL | 3 refills | Status: DC

## 2023-04-13 NOTE — Progress Notes (Signed)
Crystal Gibson returns today for evaluation for possible panniculectomy.  She states that her prescription for her insulin ran out and she was without treatment for her diabetes for 6 weeks.  Her hemoglobin A1c has increased to 12.1.  She now has her medication and her blood sugars have been in the normal range.  I have asked her to continue with her current management and continue with her walking and with her diet.  She will return to see me in 2 months and we will reevaluate her hemoglobin A1c and her weight at that time.  If both of improved will plan to schedule her for a panniculectomy.

## 2023-05-01 ENCOUNTER — Other Ambulatory Visit: Payer: Self-pay | Admitting: Cardiovascular Disease

## 2023-05-26 ENCOUNTER — Telehealth: Payer: Self-pay

## 2023-05-26 NOTE — Telephone Encounter (Signed)
Patient called asking aboout  getting a medication list printed out plus she needs to change her pharmacy

## 2023-06-08 ENCOUNTER — Encounter: Payer: Self-pay | Admitting: Plastic Surgery

## 2023-06-08 ENCOUNTER — Ambulatory Visit: Payer: Medicaid Other | Admitting: Plastic Surgery

## 2023-06-08 VITALS — BP 133/84 | HR 99 | Ht 69.0 in | Wt 234.2 lb

## 2023-06-08 DIAGNOSIS — R21 Rash and other nonspecific skin eruption: Secondary | ICD-10-CM | POA: Diagnosis not present

## 2023-06-08 DIAGNOSIS — L80 Vitiligo: Secondary | ICD-10-CM

## 2023-06-08 DIAGNOSIS — M793 Panniculitis, unspecified: Secondary | ICD-10-CM

## 2023-06-08 NOTE — Progress Notes (Signed)
Crystal Gibson returns today for follow-up.  She has not had another A1c since her last appointment.  Her weight has increased 10 pounds since her last appointment.  She is still having rashes and discomfort on the posterior aspect of her pannus.  She is working with her primary care to lower her blood sugars but she says that she is unable to successfully lower them due to ongoing stress in her personal life.  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  She is still not a candidate for panniculectomy until her sugars are under control and her A1c is closer to 6.  I will place a consult to Dr. Onalee Hua to evaluate her for her vitiligo at her request.  Follow-up by phone after next A1c is obtained.

## 2023-06-09 ENCOUNTER — Ambulatory Visit
Admission: RE | Admit: 2023-06-09 | Discharge: 2023-06-09 | Disposition: A | Discharge status: Home / Self Care | Payer: Medicaid Other | Source: Ambulatory Visit | Attending: Nurse Practitioner | Admitting: Nurse Practitioner

## 2023-06-09 DIAGNOSIS — Z1231 Encounter for screening mammogram for malignant neoplasm of breast: Secondary | ICD-10-CM | POA: Insufficient documentation

## 2023-06-11 ENCOUNTER — Ambulatory Visit (INDEPENDENT_AMBULATORY_CARE_PROVIDER_SITE_OTHER): Payer: Medicaid Other | Admitting: Cardiovascular Disease

## 2023-06-11 ENCOUNTER — Encounter: Payer: Self-pay | Admitting: Cardiovascular Disease

## 2023-06-11 VITALS — BP 110/76 | HR 102 | Ht 69.0 in | Wt 230.0 lb

## 2023-06-11 DIAGNOSIS — G4733 Obstructive sleep apnea (adult) (pediatric): Secondary | ICD-10-CM

## 2023-06-11 DIAGNOSIS — I4711 Inappropriate sinus tachycardia, so stated: Secondary | ICD-10-CM | POA: Insufficient documentation

## 2023-06-11 DIAGNOSIS — I5189 Other ill-defined heart diseases: Secondary | ICD-10-CM | POA: Insufficient documentation

## 2023-06-11 DIAGNOSIS — E782 Mixed hyperlipidemia: Secondary | ICD-10-CM | POA: Diagnosis not present

## 2023-06-11 MED ORDER — ROSUVASTATIN CALCIUM 40 MG PO TABS: 40.0000 mg | ORAL_TABLET | Freq: Every day | ORAL | 1 refills | Status: DC

## 2023-06-11 MED ORDER — IVABRADINE HCL 7.5 MG PO TABS
7.5000 mg | ORAL_TABLET | Freq: Two times a day (BID) | ORAL | 1 refills | Status: DC
Start: 2023-06-11 — End: 2024-02-02

## 2023-06-11 MED ORDER — ICOSAPENT ETHYL 1 G PO CAPS: 2.0000 g | ORAL_CAPSULE | Freq: Two times a day (BID) | ORAL | 1 refills | Status: DC

## 2023-06-11 MED ORDER — DIGOXIN 250 MCG PO TABS: 250.0000 ug | ORAL_TABLET | Freq: Every day | ORAL | 1 refills | Status: DC

## 2023-06-11 MED ORDER — METOPROLOL SUCCINATE ER 100 MG PO TB24: 200.0000 mg | ORAL_TABLET | Freq: Every day | ORAL | 1 refills | Status: DC

## 2023-06-11 MED ORDER — FUROSEMIDE 20 MG PO TABS: 20.0000 mg | ORAL_TABLET | Freq: Every day | ORAL | 1 refills | Status: DC

## 2023-06-11 MED ORDER — OLMESARTAN MEDOXOMIL 5 MG PO TABS: 5.0000 mg | ORAL_TABLET | Freq: Every day | ORAL | 1 refills | Status: DC

## 2023-06-11 MED ORDER — DAPAGLIFLOZIN PROPANEDIOL 10 MG PO TABS: ORAL_TABLET | ORAL | 1 refills | Status: DC

## 2023-06-11 MED ORDER — PRALUENT 150 MG/ML ~~LOC~~ SOAJ
150.0000 mg | SUBCUTANEOUS | 2 refills | Status: DC
Start: 2023-06-11 — End: 2023-07-28

## 2023-06-11 MED ORDER — POTASSIUM CHLORIDE CRYS ER 10 MEQ PO TBCR: EXTENDED_RELEASE_TABLET | ORAL | 1 refills | Status: DC

## 2023-06-11 NOTE — Assessment & Plan Note (Signed)
Echo 02/2023 grade I DD, on olmesartan, metoprolol, and Farxiga, continue.

## 2023-06-11 NOTE — Assessment & Plan Note (Signed)
Using and benefiting from CPAP. 

## 2023-06-11 NOTE — Assessment & Plan Note (Signed)
LDL 02/2023 208, unable to get Repatha due to insurance. Patient on rosuvastatin 40 mg daily, Vascepa. Will attempt to get Praluent for uncontrolled hyperlipidemia.

## 2023-06-11 NOTE — Progress Notes (Signed)
Cardiology Office Note   Date:  06/11/2023   ID:  MAKENDRA MESSIMER, DOB [birth year], MRN [id number]  PCP:  Orson Eva, NP  Cardiologist:  Adrian Blackwater, MD      History of Present Illness: Crystal Gibson is a 52 y.o. female who presents for  Chief Complaint  Patient presents with   Follow-up    3 months follow up    Patient in office for routine cardiac exam. Denies chest pain, shortness of breath, edema, palpitations. Patient reports two syncopal episodes since previous visit.     Past Medical History:  Diagnosis Date   Anxiety    Asthma    COVID 2021   Hospitalized with long Covid   Depression    Environmental allergies    Gestational diabetes    Pt is a type 2 diabetes   History of blood clots    when hospitalized for Salmonella poisoning and had Covid at the same time   Pericarditis 2021   while in hospital for Salmonella poisoning   Sleep apnea    does not use a cpap     Past Surgical History:  Procedure Laterality Date   ABDOMINAL HYSTERECTOMY     OOPHORECTOMY     WISDOM TOOTH EXTRACTION       Current Outpatient Medications  Medication Sig Dispense Refill   Alirocumab (PRALUENT) 150 MG/ML SOAJ Inject 1 mL (150 mg total) into the skin every 14 (fourteen) days. 6 mL 2   ACCU-CHEK FASTCLIX LANCETS MISC   2   ADVAIR HFA 115-21 MCG/ACT inhaler SMARTSIG:2 Puff(s) By Mouth Twice Daily     ARIPiprazole (ABILIFY) 15 MG tablet Take 15 mg by mouth daily.     baclofen (LIORESAL) 10 MG tablet Take 10 mg by mouth 3 (three) times daily.     BD PEN NEEDLE NANO 2ND GEN 32G X 4 MM MISC USE THREE TIMES DAILY BEFORE MEALS     Blood Glucose Monitoring Suppl (ACCU-CHEK GUIDE) w/Device KIT USE DEVICE TO CHECK SUGARS DAILY     Butalbital-APAP-Caffeine 50-300-40 MG CAPS TAKE 1 CAPSULE BY MOUTH EVERY 6 HOURS AS NEEDED FOR PERSISTENT HEADACHE 120 capsule 0   Continuous Blood Gluc Sensor (DEXCOM G6 SENSOR) MISC Apply 1 each topically as needed.     dapagliflozin propanediol  (FARXIGA) 10 MG TABS tablet TAKE 1 TABLET BY MOUTH DAILY FOR DIABETES 90 tablet 1   digoxin (LANOXIN) 0.25 MG tablet Take 1 tablet (250 mcg total) by mouth daily. 90 tablet 1   Doxycycline & Benzoyl Peroxide 30 x 100 MG & 4.4% THPK 5 mLs by Combination route at bedtime. 60 each 5   doxycycline (VIBRA-TABS) 100 MG tablet Take 1 tablet (100 mg total) by mouth 2 (two) times daily. 30 tablet 3   DULoxetine (CYMBALTA) 60 MG capsule Take 60 mg by mouth daily.  0   famotidine (PEPCID) 20 MG tablet TAKE 1 TABLET BY MOUTH EVERY DAY 90 tablet 1   fluticasone (FLONASE) 50 MCG/ACT nasal spray Place 1 spray into both nostrils 2 (two) times daily. 16 g 0   furosemide (LASIX) 20 MG tablet Take 1 tablet (20 mg total) by mouth daily. 90 tablet 1   gabapentin (NEURONTIN) 300 MG capsule Take 1 capsule (300 mg total) by mouth 3 (three) times daily. 90 capsule 3   glucose blood (ACCU-CHEK GUIDE) test strip Accu-Chek Guide In Vitro Strip QTY: 200 strip Days: 90 Refills: 1  Written: 04/27/20 Patient Instructions: TEST FASTING BLOOD SUGAR EVERY  MORNING FASTING AND EVERY EVENING E11.65     icosapent Ethyl (VASCEPA) 1 g capsule Take 2 capsules (2 g total) by mouth 2 (two) times daily. 120 capsule 1   insulin degludec (TRESIBA) 200 UNIT/ML FlexTouch Pen Inject 48 Units into the skin daily. 9 mL 3   Insulin Pen Needle (GLOBAL EASY GLIDE PEN NEEDLES) 32G X 4 MM MISC Inject 4 Needles into the skin in the morning, at noon, in the evening, and at bedtime. 100 each 5   ivabradine (CORLANOR) 7.5 MG TABS tablet Take 1 tablet (7.5 mg total) by mouth 2 (two) times daily. 180 tablet 1   Lancets Misc. (ACCU-CHEK FASTCLIX LANCET) KIT Accu-Chek Fastclix Lancet Drum  USE TO CHECK FASTING IN AM AND AS NEEDED FOR HIGH OR LOW     metoprolol succinate (TOPROL-XL) 100 MG 24 hr tablet Take 2 tablets (200 mg total) by mouth daily. 90 tablet 1   NOVOLOG FLEXPEN 100 UNIT/ML FlexPen Inject 10 Units into the skin 3 (three) times daily with meals. 15  mL 11   olmesartan (BENICAR) 5 MG tablet Take 1 tablet (5 mg total) by mouth daily. 90 tablet 1   ONETOUCH VERIO test strip 1 each 4 (four) times daily.     OXYGEN Inhale 2 L into the lungs as needed (exertion).     pantoprazole (PROTONIX) 40 MG tablet Take 1 tablet (40 mg total) by mouth 2 (two) times daily. 180 tablet 3   potassium chloride (KLOR-CON M) 10 MEQ tablet TAKE 1 TABLET BY MOUTH EVERY DAY WITH FUROSEMIDE TO PREVENT POTASSIUM DEPLETION 90 tablet 1   Rimegepant Sulfate (NURTEC) 75 MG TBDP Take 1 tablet (75 mg total) by mouth over 48 hr. 12 tablet 3   rosuvastatin (CRESTOR) 40 MG tablet Take 1 tablet (40 mg total) by mouth daily. 90 tablet 1   Semaglutide (RYBELSUS) 14 MG TABS Take 1 tablet (14 mg total) by mouth daily. 30 tablet 6   sucralfate (CARAFATE) 1 g tablet Take 1 tablet (1 g total) by mouth 4 (four) times daily. 120 tablet 1   SYMBICORT 160-4.5 MCG/ACT inhaler Inhale 2 puffs into the lungs in the morning and at bedtime.     topiramate (TOPAMAX) 25 MG tablet Take 50 mg by mouth at bedtime.     traZODone (DESYREL) 100 MG tablet Take 200 mg by mouth at bedtime.      No current facility-administered medications for this visit.    Allergies:   Glucophage [metformin]    Social History:   reports that she has never smoked. She has never used smokeless tobacco. She reports that she does not drink alcohol and does not use drugs.   Family History:  family history includes Breast cancer in her cousin, maternal aunt, maternal grandmother, and paternal aunt; Breast cancer (age of onset: 41) in her sister.    ROS:     Review of Systems  Constitutional: Negative.   HENT: Negative.    Eyes: Negative.   Respiratory:  Positive for shortness of breath.   Cardiovascular:  Positive for chest pain.  Gastrointestinal: Negative.   Genitourinary: Negative.   Musculoskeletal: Negative.   Skin: Negative.   Neurological:  Positive for loss of consciousness.  Endo/Heme/Allergies:  Negative.   Psychiatric/Behavioral: Negative.    All other systems reviewed and are negative.    All other systems are reviewed and negative.    PHYSICAL EXAM: VS:  BP 110/76   Pulse (!) 102   Ht 5\' 9"  (1.753  m)   Wt 230 lb (104.3 kg)   SpO2 94%   BMI 33.97 kg/m  , BMI Body mass index is 33.97 kg/m. Last weight:  Wt Readings from Last 3 Encounters:  06/11/23 230 lb (104.3 kg)  06/08/23 234 lb 3.2 oz (106.2 kg)  04/13/23 224 lb (101.6 kg)     Physical Exam Constitutional:      Appearance: Normal appearance.  Cardiovascular:     Rate and Rhythm: Normal rate and regular rhythm.     Heart sounds: Normal heart sounds.  Pulmonary:     Effort: Pulmonary effort is normal.     Breath sounds: Normal breath sounds.  Musculoskeletal:     Right lower leg: No edema.     Left lower leg: No edema.  Neurological:     Mental Status: She is alert.      EKG: none today  Recent Labs: 03/26/2023: ALT 36; BUN 9; Creatinine, Ser 0.98; Potassium 4.8; Sodium 134; TSH 0.545    Lipid Panel    Component Value Date/Time   CHOL 322 (H) 03/26/2023 0938   TRIG 335 (H) 03/26/2023 0938   HDL 45 03/26/2023 0938   CHOLHDL 7.2 (H) 03/26/2023 0938   LDLCALC 208 (H) 03/26/2023 2130     ASSESSMENT AND PLAN:    ICD-10-CM   1. Inappropriate sinus tachycardia  I47.11 ivabradine (CORLANOR) 7.5 MG TABS tablet    2. Mixed hyperlipidemia  E78.2 Alirocumab (PRALUENT) 150 MG/ML SOAJ    3. OSA (obstructive sleep apnea)  G47.33     4. Diastolic dysfunction  I51.89        Problem List Items Addressed This Visit       Cardiovascular and Mediastinum   Inappropriate sinus tachycardia - Primary    HR continues to be elevated on digoxin 0.25 mg, Corlanor 7.5 mg, metoprolol 100 mg. Continue same medications.       Relevant Medications   Alirocumab (PRALUENT) 150 MG/ML SOAJ   digoxin (LANOXIN) 0.25 MG tablet   furosemide (LASIX) 20 MG tablet   icosapent Ethyl (VASCEPA) 1 g capsule   ivabradine  (CORLANOR) 7.5 MG TABS tablet   metoprolol succinate (TOPROL-XL) 100 MG 24 hr tablet   olmesartan (BENICAR) 5 MG tablet   rosuvastatin (CRESTOR) 40 MG tablet     Respiratory   OSA (obstructive sleep apnea)    Using and benefiting from CPAP         Other   Mixed hyperlipidemia    LDL 02/2023 208, unable to get Repatha due to insurance. Patient on rosuvastatin 40 mg daily, Vascepa. Will attempt to get Praluent for uncontrolled hyperlipidemia.       Relevant Medications   Alirocumab (PRALUENT) 150 MG/ML SOAJ   digoxin (LANOXIN) 0.25 MG tablet   furosemide (LASIX) 20 MG tablet   icosapent Ethyl (VASCEPA) 1 g capsule   ivabradine (CORLANOR) 7.5 MG TABS tablet   metoprolol succinate (TOPROL-XL) 100 MG 24 hr tablet   olmesartan (BENICAR) 5 MG tablet   rosuvastatin (CRESTOR) 40 MG tablet   Diastolic dysfunction    Echo 02/2023 grade I DD, on olmesartan, metoprolol, and Farxiga, continue.         Disposition:   Return in about 3 months (around 09/11/2023).    Total time spent: 30 minutes  Signed,  Adrian Blackwater, MD  06/11/2023 10:38 AM    [facility]

## 2023-06-11 NOTE — Assessment & Plan Note (Signed)
HR continues to be elevated on digoxin 0.25 mg, Corlanor 7.5 mg, metoprolol 100 mg. Continue same medications.

## 2023-06-15 ENCOUNTER — Other Ambulatory Visit: Payer: Self-pay | Admitting: Nurse Practitioner

## 2023-06-16 ENCOUNTER — Other Ambulatory Visit: Payer: Self-pay | Admitting: Nurse Practitioner

## 2023-06-18 ENCOUNTER — Other Ambulatory Visit: Payer: Self-pay | Admitting: Nurse Practitioner

## 2023-06-20 ENCOUNTER — Other Ambulatory Visit: Payer: Self-pay | Admitting: Nurse Practitioner

## 2023-06-20 DIAGNOSIS — Z794 Long term (current) use of insulin: Secondary | ICD-10-CM

## 2023-06-23 ENCOUNTER — Other Ambulatory Visit: Payer: Self-pay

## 2023-06-26 ENCOUNTER — Ambulatory Visit: Payer: Medicaid Other | Admitting: Nurse Practitioner

## 2023-07-20 ENCOUNTER — Ambulatory Visit: Payer: Medicaid Other | Admitting: Nurse Practitioner

## 2023-07-23 ENCOUNTER — Ambulatory Visit: Payer: Medicaid Other | Admitting: Family

## 2023-07-24 ENCOUNTER — Other Ambulatory Visit: Payer: Self-pay | Admitting: Nurse Practitioner

## 2023-07-24 ENCOUNTER — Other Ambulatory Visit: Payer: Medicaid Other

## 2023-07-24 DIAGNOSIS — E785 Hyperlipidemia, unspecified: Secondary | ICD-10-CM | POA: Diagnosis not present

## 2023-07-24 DIAGNOSIS — E1165 Type 2 diabetes mellitus with hyperglycemia: Secondary | ICD-10-CM | POA: Diagnosis not present

## 2023-07-24 DIAGNOSIS — I10 Essential (primary) hypertension: Secondary | ICD-10-CM | POA: Diagnosis not present

## 2023-07-28 ENCOUNTER — Encounter: Payer: Self-pay | Admitting: Cardiology

## 2023-07-28 ENCOUNTER — Ambulatory Visit: Payer: Medicaid Other | Admitting: Cardiology

## 2023-07-28 VITALS — BP 140/78 | HR 94 | Ht 69.0 in | Wt 240.6 lb

## 2023-07-28 DIAGNOSIS — E782 Mixed hyperlipidemia: Secondary | ICD-10-CM

## 2023-07-28 DIAGNOSIS — E119 Type 2 diabetes mellitus without complications: Secondary | ICD-10-CM | POA: Diagnosis not present

## 2023-07-28 DIAGNOSIS — Z794 Long term (current) use of insulin: Secondary | ICD-10-CM | POA: Diagnosis not present

## 2023-07-28 MED ORDER — PRALUENT 150 MG/ML ~~LOC~~ SOAJ
150.0000 mg | SUBCUTANEOUS | 2 refills | Status: DC
Start: 2023-07-28 — End: 2023-07-28

## 2023-07-28 MED ORDER — DEXCOM G6 TRANSMITTER MISC: 1.0000 | 2 refills | Status: DC | PRN

## 2023-07-28 NOTE — Progress Notes (Signed)
Established Patient Office Visit  Subjective:  Patient ID: Crystal Gibson, female    DOB: 07-18-1971  Age: 52 y.o. MRN: [id number]  Chief Complaint  Patient presents with   Follow-up    3 month follow up, medication refills.   Leg Pain    Right leg pain for months.    Patient in office for 3 month follow up. Complains of leg pain since falling months ago. Recent lab work, Hgb A1c improving. Patient has not been taking PKS9 due to insurance difficulties, needs prior authorization. Needs new referral for podiatry.   Leg Pain  The incident occurred more than 1 week ago. The incident occurred at home. The injury mechanism was a fall. The pain is present in the right leg. The pain is mild. The pain has been Intermittent since onset. She reports no foreign bodies present. The symptoms are aggravated by movement. She has tried elevation and rest for the symptoms. The treatment provided mild relief.    No other concerns at this time.   Past Medical History:  Diagnosis Date   Anxiety    Asthma    Bacterial conjunctivitis of right eye 03/16/2023   COVID 2021   Hospitalized with long Covid   Depression    Environmental allergies    Gestational diabetes    Pt is a type 2 diabetes   History of blood clots    when hospitalized for Salmonella poisoning and had Covid at the same time   Lumbar sprain 11/03/2018   Pericarditis 2021   while in hospital for Salmonella poisoning   Sesamoiditis of foot 11/05/2020   Sleep apnea    does not use a cpap    Past Surgical History:  Procedure Laterality Date   ABDOMINAL HYSTERECTOMY     OOPHORECTOMY     WISDOM TOOTH EXTRACTION      Social History   Socioeconomic History   Marital status: Single    Spouse name: Not on file   Number of children: Not on file   Years of education: Not on file   Highest education level: Not on file  Occupational History   Not on file  Tobacco Use   Smoking status: Never   Smokeless tobacco: Never  Vaping  Use   Vaping status: Never Used  Substance and Sexual Activity   Alcohol use: No   Drug use: No   Sexual activity: Yes    Birth control/protection: Surgical  Other Topics Concern   Not on file  Social History Narrative   Not on file   Social Determinants of Health   Financial Resource Strain: High Risk (08/06/2022)   Overall Financial Resource Strain (CARDIA)    Difficulty of Paying Living Expenses: Hard  Food Insecurity: Food Insecurity Present (08/06/2022)   Hunger Vital Sign    Worried About Running Out of Food in the Last Year: Sometimes true    Ran Out of Food in the Last Year: Sometimes true  Transportation Needs: No Transportation Needs (10/13/2022)   PRAPARE - Administrator, Civil Service (Medical): No    Lack of Transportation (Non-Medical): No  Physical Activity: Inactive (09/08/2022)   Exercise Vital Sign    Days of Exercise per Week: 0 days    Minutes of Exercise per Session: 0 min  Stress: Stress Concern Present (06/24/2022)   [employer] of Occupational Health - Occupational Stress Questionnaire    Feeling of Stress : To some extent  Social Connections: Socially Isolated (07/30/2022)  Social Advertising account executive [NHANES]    Frequency of Communication with Friends and Family: More than three times a week    Frequency of Social Gatherings with Friends and Family: More than three times a week    Attends Religious Services: Never    Database administrator or Organizations: No    Attends Banker Meetings: Never    Marital Status: Never married  Intimate Partner Violence: Not At Risk (10/13/2022)   Humiliation, Afraid, Rape, and Kick questionnaire    Fear of Current or Ex-Partner: No    Emotionally Abused: No    Physically Abused: No    Sexually Abused: No    Family History  Problem Relation Age of Onset   Breast cancer Sister 50   Breast cancer Maternal Aunt    Breast cancer Paternal Aunt    Breast cancer Maternal  Grandmother    Breast cancer Cousin     Allergies  Allergen Reactions   Glucophage [Metformin] Nausea And Vomiting    Review of Systems  Constitutional: Negative.   HENT: Negative.    Eyes: Negative.   Respiratory: Negative.  Negative for shortness of breath.   Cardiovascular: Negative.  Negative for chest pain.  Gastrointestinal: Negative.  Negative for abdominal pain, constipation and diarrhea.  Genitourinary: Negative.   Musculoskeletal:  Negative for joint pain and myalgias.  Skin: Negative.   Neurological: Negative.  Negative for dizziness and headaches.  Endo/Heme/Allergies: Negative.   All other systems reviewed and are negative.      Objective:   BP (!) 140/78   Pulse 94   Ht 5\' 9"  (1.753 m)   Wt 240 lb 9.6 oz (109.1 kg)   SpO2 98% Comment: 5 L  BMI 35.53 kg/m   Vitals:   07/28/23 1049  BP: (!) 140/78  Pulse: 94  Height: 5\' 9"  (1.753 m)  Weight: 240 lb 9.6 oz (109.1 kg)  SpO2: 98% Comment: 5 L  BMI (Calculated): 35.51    Physical Exam Vitals and nursing note reviewed.  Constitutional:      Appearance: Normal appearance. She is normal weight.  HENT:     Head: Normocephalic and atraumatic.     Nose: Nose normal.     Mouth/Throat:     Mouth: Mucous membranes are moist.  Eyes:     Extraocular Movements: Extraocular movements intact.     Conjunctiva/sclera: Conjunctivae normal.     Pupils: Pupils are equal, round, and reactive to light.  Cardiovascular:     Rate and Rhythm: Normal rate and regular rhythm.     Pulses: Normal pulses.     Heart sounds: Normal heart sounds.  Pulmonary:     Effort: Pulmonary effort is normal.     Breath sounds: Normal breath sounds.  Abdominal:     General: Abdomen is flat. Bowel sounds are normal.     Palpations: Abdomen is soft.  Musculoskeletal:        General: Normal range of motion.     Cervical back: Normal range of motion.  Skin:    General: Skin is warm and dry.  Neurological:     General: No focal  deficit present.     Mental Status: She is alert and oriented to person, place, and time.  Psychiatric:        Mood and Affect: Mood normal.        Behavior: Behavior normal.        Thought Content: Thought content normal.  Judgment: Judgment normal.      No results found for any visits on 07/28/23.  Recent Results (from the past 2160 hour(s))  Comprehensive metabolic panel     Status: Abnormal   Collection Time: 07/24/23 10:41 AM  Result Value Ref Range   Glucose 89 70 - 99 mg/dL   BUN 9 6 - 24 mg/dL   Creatinine, Ser 8.29 0.57 - 1.00 mg/dL   eGFR 91 >56 OZ/HYQ/6.57   BUN/Creatinine Ratio 11 9 - 23   Sodium 142 134 - 144 mmol/L   Potassium 4.0 3.5 - 5.2 mmol/L   Chloride 104 96 - 106 mmol/L   CO2 23 20 - 29 mmol/L   Calcium 9.6 8.7 - 10.2 mg/dL   Total Protein 7.1 6.0 - 8.5 g/dL   Albumin 4.3 3.8 - 4.9 g/dL   Globulin, Total 2.8 1.5 - 4.5 g/dL   Bilirubin Total 0.3 0.0 - 1.2 mg/dL   Alkaline Phosphatase 71 44 - 121 IU/L   AST 23 0 - 40 IU/L   ALT 33 (H) 0 - 32 IU/L  Lipid Panel w/o Chol/HDL Ratio     Status: Abnormal   Collection Time: 07/24/23 10:41 AM  Result Value Ref Range   Cholesterol, Total 265 (H) 100 - 199 mg/dL   Triglycerides 846 (H) 0 - 149 mg/dL   HDL 39 (L) >96 mg/dL   VLDL Cholesterol Cal 31 5 - 40 mg/dL   LDL Chol Calc (NIH) 295 (H) 0 - 99 mg/dL   LDL CALC COMMENT: Comment     Comment: Consider evaluating for Familial Hypercholesterolemia(FH), if clinically indicated.   Hgb A1c w/o eAG     Status: Abnormal   Collection Time: 07/24/23 10:41 AM  Result Value Ref Range   Hgb A1c MFr Bld 8.4 (H) 4.8 - 5.6 %    Comment:          Prediabetes: 5.7 - 6.4          Diabetes: >6.4          Glycemic control for adults with diabetes: <7.0   TSH     Status: None   Collection Time: 07/24/23 10:41 AM  Result Value Ref Range   TSH 0.618 0.450 - 4.500 uIU/mL      Assessment & Plan:  Referral sent for podiatry.  Continue following diabetic diet to  get Hgb A1c down.  Office to complete prior auth for Repatha.   Problem List Items Addressed This Visit       Endocrine   Type 2 diabetes mellitus without complication, with long-term current use of insulin (HCC) - Primary   Relevant Medications   insulin aspart (NOVOLOG) 100 UNIT/ML injection   Other Relevant Orders   Ambulatory referral to Podiatry     Other   Mixed hyperlipidemia   Relevant Medications   Evolocumab (REPATHA SURECLICK) 140 MG/ML SOAJ    Return in about 3 months (around 10/28/2023) for with fasting labs prior.   Total time spent: 30 minutes  Google, NP  07/28/2023   This document may have been prepared by Dragon Voice Recognition software and as such may include unintentional dictation errors.

## 2023-08-13 ENCOUNTER — Ambulatory Visit: Payer: Medicaid Other | Admitting: Podiatry

## 2023-08-17 ENCOUNTER — Ambulatory Visit (INDEPENDENT_AMBULATORY_CARE_PROVIDER_SITE_OTHER): Payer: Medicaid Other | Admitting: Podiatry

## 2023-08-17 DIAGNOSIS — E1149 Type 2 diabetes mellitus with other diabetic neurological complication: Secondary | ICD-10-CM | POA: Diagnosis not present

## 2023-08-17 DIAGNOSIS — E1140 Type 2 diabetes mellitus with diabetic neuropathy, unspecified: Secondary | ICD-10-CM | POA: Diagnosis not present

## 2023-08-17 DIAGNOSIS — B351 Tinea unguium: Secondary | ICD-10-CM | POA: Diagnosis not present

## 2023-08-17 NOTE — Progress Notes (Signed)
Subjective:   Patient ID: Crystal Gibson, female   DOB: 52 y.o.   MRN: [id number]   HPI Patient presents with concerns of swelling in her feet and also that her sugars been elevated with an A1c close to 9 and nails that are hard for her to cut.  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  Discussed neuropathy continue gabapentin May have to increase dosage the absolute importance of lowering A1c.  Spent time going over all these different factors for this patient

## 2023-08-19 ENCOUNTER — Telehealth: Payer: Self-pay | Admitting: Nurse Practitioner

## 2023-08-19 NOTE — Telephone Encounter (Signed)
[facility] called to check status of Repatha PA.  Callback # [id number] Reference # [id number]

## 2023-08-21 ENCOUNTER — Telehealth: Payer: Self-pay | Admitting: Nurse Practitioner

## 2023-08-21 NOTE — Telephone Encounter (Signed)
[facility] left a VM regarding the patient's PA for Repatha was denied. That is all the VM stated.

## 2023-08-24 ENCOUNTER — Ambulatory Visit: Payer: Medicaid Other | Admitting: Cardiology

## 2023-08-24 ENCOUNTER — Encounter: Payer: Self-pay | Admitting: Cardiology

## 2023-08-24 VITALS — BP 112/70 | HR 103 | Ht 68.5 in | Wt 240.0 lb

## 2023-08-24 DIAGNOSIS — Z91013 Allergy to seafood: Secondary | ICD-10-CM | POA: Diagnosis not present

## 2023-08-24 MED ORDER — FEXOFENADINE-PSEUDOEPHED ER 60-120 MG PO TB12: 1.0000 | ORAL_TABLET | Freq: Two times a day (BID) | ORAL | 11 refills | Status: AC

## 2023-08-24 MED ORDER — DEXCOM G6 TRANSMITTER MISC: 1.0000 | 2 refills | Status: DC | PRN

## 2023-08-24 MED ORDER — EPINEPHRINE 0.3 MG/0.3ML IJ SOAJ: 0.3000 mg | INTRAMUSCULAR | 6 refills | Status: DC | PRN
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
## 2023-08-26 ENCOUNTER — Ambulatory Visit: Payer: Medicaid Other | Admitting: Podiatry

## 2023-09-01 ENCOUNTER — Encounter: Payer: Self-pay | Admitting: Cardiology

## 2023-09-01 ENCOUNTER — Ambulatory Visit (INDEPENDENT_AMBULATORY_CARE_PROVIDER_SITE_OTHER): Payer: Medicaid Other | Admitting: Cardiology

## 2023-09-01 VITALS — BP 110/78 | HR 101 | Ht 68.5 in | Wt 236.2 lb

## 2023-09-01 DIAGNOSIS — Z91013 Allergy to seafood: Secondary | ICD-10-CM

## 2023-09-01 NOTE — Progress Notes (Signed)
Established Patient Office Visit  Subjective:  Patient ID: Crystal Gibson, female    DOB: [birth year]  Age: 52 y.o. MRN: [id number]  Chief Complaint  Patient presents with   Follow-up    1 week follow up    Patient in office for 1 week follow up. Patient feeling better. Still taking Claritin. Reports her left eye continues to have some itching. Has been using an eye wash, may be over used. Continue Claritin. Patient carrying Epi pen now to use as needed.     No other concerns at this time.   Past Medical History:  Diagnosis Date   Anxiety    Asthma    Bacterial conjunctivitis of right eye 03/16/2023   COVID 2021   Hospitalized with long Covid   Depression    Environmental allergies    Gestational diabetes    Pt is a type 2 diabetes   History of blood clots    when hospitalized for Salmonella poisoning and had Covid at the same time   Lumbar sprain 11/03/2018   Pericarditis 2021   while in hospital for Salmonella poisoning   Sesamoiditis of foot 11/05/2020   Sleep apnea    does not use a cpap    Past Surgical History:  Procedure Laterality Date   ABDOMINAL HYSTERECTOMY     OOPHORECTOMY     WISDOM TOOTH EXTRACTION      Social History   Socioeconomic History   Marital status: Single    Spouse name: Not on file   Number of children: Not on file   Years of education: Not on file   Highest education level: Not on file  Occupational History   Not on file  Tobacco Use   Smoking status: Never   Smokeless tobacco: Never  Vaping Use   Vaping status: Never Used  Substance and Sexual Activity   Alcohol use: No   Drug use: No   Sexual activity: Yes    Birth control/protection: Surgical  Other Topics Concern   Not on file  Social History Narrative   Not on file   Social Determinants of Health   Financial Resource Strain: High Risk (08/06/2022)   Overall Financial Resource Strain (CARDIA)    Difficulty of Paying Living Expenses: Hard  Food Insecurity: Food  Insecurity Present (08/06/2022)   Hunger Vital Sign    Worried About Running Out of Food in the Last Year: Sometimes true    Ran Out of Food in the Last Year: Sometimes true  Transportation Needs: No Transportation Needs (10/13/2022)   PRAPARE - Administrator, Civil Service (Medical): No    Lack of Transportation (Non-Medical): No  Physical Activity: Inactive (09/08/2022)   Exercise Vital Sign    Days of Exercise per Week: 0 days    Minutes of Exercise per Session: 0 min  Stress: Stress Concern Present (06/24/2022)   [employer] of Occupational Health - Occupational Stress Questionnaire    Feeling of Stress : To some extent  Social Connections: Socially Isolated (07/30/2022)   Social Connection and Isolation Panel [NHANES]    Frequency of Communication with Friends and Family: More than three times a week    Frequency of Social Gatherings with Friends and Family: More than three times a week    Attends Religious Services: Never    Database administrator or Organizations: No    Attends Banker Meetings: Never    Marital Status: Never married  Intimate Partner Violence: Not  At Risk (10/13/2022)   Humiliation, Afraid, Rape, and Kick questionnaire    Fear of Current or Ex-Partner: No    Emotionally Abused: No    Physically Abused: No    Sexually Abused: No    Family History  Problem Relation Age of Onset   Breast cancer Sister 27   Breast cancer Maternal Aunt    Breast cancer Paternal Aunt    Breast cancer Maternal Grandmother    Breast cancer Cousin     Allergies  Allergen Reactions   Glucophage [Metformin] Nausea And Vomiting    Review of Systems  Constitutional: Negative.   HENT: Negative.    Eyes: Negative.   Respiratory: Negative.  Negative for shortness of breath.   Cardiovascular: Negative.  Negative for chest pain.  Gastrointestinal: Negative.  Negative for abdominal pain, constipation and diarrhea.  Genitourinary: Negative.    Musculoskeletal:  Negative for joint pain and myalgias.  Skin: Negative.   Neurological: Negative.  Negative for dizziness and headaches.  Endo/Heme/Allergies: Negative.   All other systems reviewed and are negative.      Objective:   BP 110/78   Pulse (!) 101   Ht 5' 8.5" (1.74 m)   Wt 236 lb 3.2 oz (107.1 kg)   SpO2 96% Comment: 5L  BMI 35.39 kg/m   Vitals:   09/01/23 0944  BP: 110/78  Pulse: (!) 101  Height: 5' 8.5" (1.74 m)  Weight: 236 lb 3.2 oz (107.1 kg)  SpO2: 96% Comment: 5L  BMI (Calculated): 35.39    Physical Exam Vitals and nursing note reviewed.  Constitutional:      Appearance: Normal appearance. She is normal weight.  HENT:     Head: Normocephalic and atraumatic.     Nose: Nose normal.     Mouth/Throat:     Mouth: Mucous membranes are moist.  Eyes:     Extraocular Movements: Extraocular movements intact.     Conjunctiva/sclera: Conjunctivae normal.     Pupils: Pupils are equal, round, and reactive to light.  Cardiovascular:     Rate and Rhythm: Normal rate and regular rhythm.     Pulses: Normal pulses.     Heart sounds: Normal heart sounds.  Pulmonary:     Effort: Pulmonary effort is normal.     Breath sounds: Normal breath sounds.  Abdominal:     General: Abdomen is flat. Bowel sounds are normal.     Palpations: Abdomen is soft.  Musculoskeletal:        General: Normal range of motion.     Cervical back: Normal range of motion.  Skin:    General: Skin is warm and dry.  Neurological:     General: No focal deficit present.     Mental Status: She is alert and oriented to person, place, and time.  Psychiatric:        Mood and Affect: Mood normal.        Behavior: Behavior normal.        Thought Content: Thought content normal.        Judgment: Judgment normal.      No results found for any visits on 09/01/23.  Recent Results (from the past 2160 hour(s))  Comprehensive metabolic panel     Status: Abnormal   Collection Time:  07/24/23 10:41 AM  Result Value Ref Range   Glucose 89 70 - 99 mg/dL   BUN 9 6 - 24 mg/dL   Creatinine, Ser 6.29 0.57 - 1.00 mg/dL   eGFR 91 >  59 mL/min/1.73   BUN/Creatinine Ratio 11 9 - 23   Sodium 142 134 - 144 mmol/L   Potassium 4.0 3.5 - 5.2 mmol/L   Chloride 104 96 - 106 mmol/L   CO2 23 20 - 29 mmol/L   Calcium 9.6 8.7 - 10.2 mg/dL   Total Protein 7.1 6.0 - 8.5 g/dL   Albumin 4.3 3.8 - 4.9 g/dL   Globulin, Total 2.8 1.5 - 4.5 g/dL   Bilirubin Total 0.3 0.0 - 1.2 mg/dL   Alkaline Phosphatase 71 44 - 121 IU/L   AST 23 0 - 40 IU/L   ALT 33 (H) 0 - 32 IU/L  Lipid Panel w/o Chol/HDL Ratio     Status: Abnormal   Collection Time: 07/24/23 10:41 AM  Result Value Ref Range   Cholesterol, Total 265 (H) 100 - 199 mg/dL   Triglycerides 295 (H) 0 - 149 mg/dL   HDL 39 (L) >62 mg/dL   VLDL Cholesterol Cal 31 5 - 40 mg/dL   LDL Chol Calc (NIH) 130 (H) 0 - 99 mg/dL   LDL CALC COMMENT: Comment     Comment: Consider evaluating for Familial Hypercholesterolemia(FH), if clinically indicated.   Hgb A1c w/o eAG     Status: Abnormal   Collection Time: 07/24/23 10:41 AM  Result Value Ref Range   Hgb A1c MFr Bld 8.4 (H) 4.8 - 5.6 %    Comment:          Prediabetes: 5.7 - 6.4          Diabetes: >6.4          Glycemic control for adults with diabetes: <7.0   TSH     Status: None   Collection Time: 07/24/23 10:41 AM  Result Value Ref Range   TSH 0.618 0.450 - 4.500 uIU/mL      Assessment & Plan:  Continue Claritin. Keep Epi pen on hand to use as needed. Keep October appointment with blood work prior.   Problem List Items Addressed This Visit       Other   Shellfish allergy - Primary    Return if symptoms worsen or fail to improve, for keep October appt.   Total time spent: 25 minutes  [employer], NP  09/01/2023   This document may have been prepared by Dragon Voice Recognition software and as such may include unintentional dictation errors.

## 2023-09-08 ENCOUNTER — Ambulatory Visit: Payer: Medicaid Other | Admitting: Cardiovascular Disease

## 2023-09-15 ENCOUNTER — Ambulatory Visit: Payer: Medicaid Other | Admitting: Cardiovascular Disease

## 2023-09-18 ENCOUNTER — Encounter: Payer: Self-pay | Admitting: Cardiovascular Disease

## 2023-09-18 ENCOUNTER — Ambulatory Visit: Payer: Medicaid Other | Admitting: Cardiovascular Disease

## 2023-09-18 VITALS — BP 142/79 | HR 72 | Ht 69.0 in | Wt 238.0 lb

## 2023-09-18 DIAGNOSIS — I4711 Inappropriate sinus tachycardia, so stated: Secondary | ICD-10-CM

## 2023-09-18 DIAGNOSIS — R0789 Other chest pain: Secondary | ICD-10-CM | POA: Diagnosis not present

## 2023-09-18 DIAGNOSIS — E782 Mixed hyperlipidemia: Secondary | ICD-10-CM | POA: Diagnosis not present

## 2023-09-18 DIAGNOSIS — G4733 Obstructive sleep apnea (adult) (pediatric): Secondary | ICD-10-CM | POA: Diagnosis not present

## 2023-09-18 NOTE — Progress Notes (Signed)
Cardiology Office Note   Date:  09/18/2023   ID:  Crystal Gibson, DOB September 13, 1971, MRN [id number]  PCP:  Orson Eva, NP (Inactive)  Cardiologist:  Adrian Blackwater, MD      History of Present Illness: Crystal Gibson is a 52 y.o. female who presents for No chief complaint on file.   Have headache  Chest Pain  This is a new problem. The current episode started today. The onset quality is sudden. The problem has been waxing and waning.      Past Medical History:  Diagnosis Date   Anxiety    Asthma    Bacterial conjunctivitis of right eye 03/16/2023   COVID 2021   Hospitalized with long Covid   Depression    Environmental allergies    Gestational diabetes    Pt is a type 2 diabetes   History of blood clots    when hospitalized for Salmonella poisoning and had Covid at the same time   Lumbar sprain 11/03/2018   Pericarditis 2021   while in hospital for Salmonella poisoning   Sesamoiditis of foot 11/05/2020   Sleep apnea    does not use a cpap     Past Surgical History:  Procedure Laterality Date   ABDOMINAL HYSTERECTOMY     OOPHORECTOMY     WISDOM TOOTH EXTRACTION       Current Outpatient Medications  Medication Sig Dispense Refill   ACCU-CHEK FASTCLIX LANCETS MISC   2   ADVAIR HFA 115-21 MCG/ACT inhaler SMARTSIG:2 Puff(s) By Mouth Twice Daily     ARIPiprazole (ABILIFY) 15 MG tablet Take 15 mg by mouth daily.     baclofen (LIORESAL) 10 MG tablet Take 10 mg by mouth 3 (three) times daily.     BD PEN NEEDLE NANO 2ND GEN 32G X 4 MM MISC USE THREE TIMES DAILY BEFORE MEALS     Blood Glucose Monitoring Suppl (ACCU-CHEK GUIDE) w/Device KIT USE DEVICE TO CHECK SUGARS DAILY     Butalbital-APAP-Caffeine 50-300-40 MG CAPS TAKE 1 CAPSULE BY MOUTH EVERY 6 HOURS AS NEEDED FOR PERSISTENT HEADACHE 120 capsule 0   Continuous Glucose Sensor (DEXCOM G6 SENSOR) MISC USE WITH DEXCOM RECEIVER AND TRANSMITTER TO TRACK BLOOD SUGAR VALUES CHANGE SENOR EVERY 10 DAYS AS DIRECTED 9  each 3   Continuous Glucose Transmitter (DEXCOM G6 TRANSMITTER) MISC 1 Box by Does not apply route as needed. 2 each 2   dapagliflozin propanediol (FARXIGA) 10 MG TABS tablet TAKE 1 TABLET BY MOUTH DAILY FOR DIABETES 90 tablet 1   digoxin (LANOXIN) 0.25 MG tablet Take 1 tablet (250 mcg total) by mouth daily. 90 tablet 1   Doxycycline & Benzoyl Peroxide 30 x 100 MG & 4.4% THPK 5 mLs by Combination route at bedtime. 60 each 5   doxycycline (VIBRA-TABS) 100 MG tablet Take 1 tablet (100 mg total) by mouth 2 (two) times daily. 30 tablet 3   DULoxetine (CYMBALTA) 60 MG capsule Take 60 mg by mouth daily.  0   EPINEPHrine 0.3 mg/0.3 mL IJ SOAJ injection Inject 0.3 mg into the muscle as needed for anaphylaxis. 1 each 6   Evolocumab (REPATHA SURECLICK) 140 MG/ML SOAJ Inject 140 mg into the skin every 14 (fourteen) days.     famotidine (PEPCID) 20 MG tablet TAKE 1 TABLET BY MOUTH EVERY DAY 90 tablet 1   fexofenadine-pseudoephedrine (ALLEGRA-D) 60-120 MG 12 hr tablet Take 1 tablet by mouth 2 (two) times daily. 30 tablet 11   fluticasone (FLONASE) 50 MCG/ACT  nasal spray Place 1 spray into both nostrils 2 (two) times daily. 16 g 0   furosemide (LASIX) 20 MG tablet Take 1 tablet (20 mg total) by mouth daily. (Patient taking differently: Take 40 mg by mouth daily.) 90 tablet 1   gabapentin (NEURONTIN) 300 MG capsule Take 1 capsule (300 mg total) by mouth 3 (three) times daily. 90 capsule 3   glucose blood (ACCU-CHEK GUIDE) test strip Accu-Chek Guide In Vitro Strip QTY: 200 strip Days: 90 Refills: 1  Written: 04/27/20 Patient Instructions: TEST FASTING BLOOD SUGAR EVERY MORNING FASTING AND EVERY EVENING E11.65     icosapent Ethyl (VASCEPA) 1 g capsule Take 2 capsules (2 g total) by mouth 2 (two) times daily. 120 capsule 1   insulin aspart (NOVOLOG) 100 UNIT/ML injection Inject 10 Units into the skin 3 (three) times daily before meals.     Insulin Pen Needle (GLOBAL EASY GLIDE PEN NEEDLES) 32G X 4 MM MISC Inject 4  Needles into the skin in the morning, at noon, in the evening, and at bedtime. 100 each 5   ivabradine (CORLANOR) 7.5 MG TABS tablet Take 1 tablet (7.5 mg total) by mouth 2 (two) times daily. 180 tablet 1   Lancets Misc. (ACCU-CHEK FASTCLIX LANCET) KIT Accu-Chek Fastclix Lancet Drum  USE TO CHECK FASTING IN AM AND AS NEEDED FOR HIGH OR LOW     metoprolol succinate (TOPROL-XL) 100 MG 24 hr tablet Take 2 tablets (200 mg total) by mouth daily. 90 tablet 1   NOVOLOG FLEXPEN 100 UNIT/ML FlexPen Inject 10 Units into the skin 3 (three) times daily with meals. 15 mL 11   olmesartan (BENICAR) 5 MG tablet Take 1 tablet (5 mg total) by mouth daily. 90 tablet 1   ONETOUCH VERIO test strip 1 each 4 (four) times daily.     OXYGEN Inhale 2 L into the lungs as needed (exertion).     pantoprazole (PROTONIX) 40 MG tablet Take 1 tablet (40 mg total) by mouth 2 (two) times daily. 180 tablet 3   potassium chloride (KLOR-CON M) 10 MEQ tablet TAKE 1 TABLET BY MOUTH EVERY DAILY WITH FUROSEMIDE TO PREVENT POTASSIUM DEPLETION 30 tablet 11   Rimegepant Sulfate (NURTEC) 75 MG TBDP Take 1 tablet (75 mg total) by mouth over 48 hr. 12 tablet 3   rosuvastatin (CRESTOR) 40 MG tablet Take 1 tablet (40 mg total) by mouth daily. 90 tablet 1   Semaglutide (RYBELSUS) 14 MG TABS Take 1 tablet (14 mg total) by mouth daily. 30 tablet 6   sucralfate (CARAFATE) 1 g tablet Take 1 tablet (1 g total) by mouth 4 (four) times daily. 120 tablet 1   SYMBICORT 160-4.5 MCG/ACT inhaler Inhale 2 puffs into the lungs in the morning and at bedtime.     topiramate (TOPAMAX) 25 MG tablet Take 50 mg by mouth at bedtime.     traZODone (DESYREL) 100 MG tablet Take 200 mg by mouth at bedtime.      TRESIBA FLEXTOUCH 200 UNIT/ML FlexTouch Pen ADMINISTER 48 UNITS UNDER THE SKIN DAILY 9 mL 3   No current facility-administered medications for this visit.    Allergies:   Glucophage [metformin]    Social History:   reports that she has never smoked. She has  never used smokeless tobacco. She reports that she does not drink alcohol and does not use drugs.   Family History:  family history includes Breast cancer in her cousin, maternal aunt, maternal grandmother, and paternal aunt; Breast cancer (age of  onset: 34) in her sister.    ROS:     Review of Systems  Constitutional: Negative.   HENT: Negative.    Eyes: Negative.   Respiratory: Negative.    Cardiovascular:  Positive for chest pain.  Gastrointestinal: Negative.   Genitourinary: Negative.   Musculoskeletal: Negative.   Skin: Negative.   Neurological: Negative.   Endo/Heme/Allergies: Negative.   Psychiatric/Behavioral: Negative.    All other systems reviewed and are negative.     All other systems are reviewed and negative.    PHYSICAL EXAM: VS:  BP (!) 142/79   Pulse 72   Ht 5\' 9"  (1.753 m)   Wt 238 lb (108 kg)   SpO2 97%   BMI 35.15 kg/m  , BMI Body mass index is 35.15 kg/m. Last weight:  Wt Readings from Last 3 Encounters:  09/18/23 238 lb (108 kg)  09/01/23 236 lb 3.2 oz (107.1 kg)  08/24/23 240 lb (108.9 kg)     Physical Exam Constitutional:      Appearance: Normal appearance.  Cardiovascular:     Rate and Rhythm: Normal rate and regular rhythm.     Heart sounds: Normal heart sounds.  Pulmonary:     Effort: Pulmonary effort is normal.     Breath sounds: Normal breath sounds.  Musculoskeletal:     Right lower leg: No edema.     Left lower leg: No edema.  Neurological:     Mental Status: She is alert.       EKG:   Recent Labs: 07/24/2023: ALT 33; BUN 9; Creatinine, Ser 0.79; Potassium 4.0; Sodium 142; TSH 0.618    Lipid Panel    Component Value Date/Time   CHOL 265 (H) 07/24/2023 1041   TRIG 165 (H) 07/24/2023 1041   HDL 39 (L) 07/24/2023 1041   CHOLHDL 7.2 (H) 03/26/2023 0938   LDLCALC 195 (H) 07/24/2023 1041      Other studies Reviewed: Additional studies/ records that were reviewed today include:  Review of the above records  demonstrates:       No data to display            ASSESSMENT AND PLAN:    ICD-10-CM   1. Inappropriate sinus tachycardia  I47.11     2. OSA (obstructive sleep apnea)  G47.33     3. Mixed hyperlipidemia  E78.2     4. Other chest pain  R07.89        Problem List Items Addressed This Visit       Cardiovascular and Mediastinum   Inappropriate sinus tachycardia - Primary     Respiratory   OSA (obstructive sleep apnea)     Other   Mixed hyperlipidemia   Other Visit Diagnoses     Other chest pain              Disposition:   Return in about 2 months (around 11/18/2023).    Total time spent: 35 minutes  Signed,  Adrian Blackwater, MD  09/18/2023 11:13 AM    [facility]

## 2023-09-25 ENCOUNTER — Other Ambulatory Visit: Payer: Self-pay

## 2023-09-25 MED ORDER — REPATHA SURECLICK 140 MG/ML ~~LOC~~ SOAJ: 140.0000 mg | SUBCUTANEOUS | 2 refills | Status: DC

## 2023-09-28 ENCOUNTER — Telehealth: Payer: Self-pay | Admitting: Nurse Practitioner

## 2023-09-28 NOTE — Telephone Encounter (Signed)
[facility] left a VM about a PA for patient's Repatha. I do not have a PA request for this medication for this patient. I need to call them back at [phone].

## 2023-10-26 ENCOUNTER — Other Ambulatory Visit: Payer: Self-pay

## 2023-10-26 ENCOUNTER — Other Ambulatory Visit: Payer: Medicaid Other

## 2023-10-26 ENCOUNTER — Other Ambulatory Visit: Payer: Self-pay | Admitting: Cardiology

## 2023-10-26 DIAGNOSIS — Z1329 Encounter for screening for other suspected endocrine disorder: Secondary | ICD-10-CM | POA: Diagnosis not present

## 2023-10-26 DIAGNOSIS — E782 Mixed hyperlipidemia: Secondary | ICD-10-CM

## 2023-10-26 DIAGNOSIS — E119 Type 2 diabetes mellitus without complications: Secondary | ICD-10-CM | POA: Diagnosis not present

## 2023-10-26 DIAGNOSIS — Z794 Long term (current) use of insulin: Secondary | ICD-10-CM

## 2023-10-26 DIAGNOSIS — I10 Essential (primary) hypertension: Secondary | ICD-10-CM | POA: Diagnosis not present

## 2023-10-27 LAB — HEMOGLOBIN A1C
Est. average glucose Bld gHb Est-mCnc: 226 mg/dL
Hgb A1c MFr Bld: 9.5 % — ABNORMAL HIGH (ref 4.8–5.6)

## 2023-10-27 LAB — CMP14+EGFR
ALT: 53 [IU]/L — ABNORMAL HIGH (ref 0–32)
AST: 29 [IU]/L (ref 0–40)
Albumin: 4.5 g/dL (ref 3.8–4.9)
Alkaline Phosphatase: 81 [IU]/L (ref 44–121)
BUN/Creatinine Ratio: 12 (ref 9–23)
BUN: 10 mg/dL (ref 6–24)
Bilirubin Total: 0.4 mg/dL (ref 0.0–1.2)
CO2: 24 mmol/L (ref 20–29)
Calcium: 9.7 mg/dL (ref 8.7–10.2)
Chloride: 99 mmol/L (ref 96–106)
Creatinine, Ser: 0.81 mg/dL (ref 0.57–1.00)
Globulin, Total: 3.3 g/dL (ref 1.5–4.5)
Glucose: 165 mg/dL — ABNORMAL HIGH (ref 70–99)
Potassium: 4 mmol/L (ref 3.5–5.2)
Sodium: 140 mmol/L (ref 134–144)
Total Protein: 7.8 g/dL (ref 6.0–8.5)
eGFR: 87 mL/min/{1.73_m2} (ref >59)

## 2023-10-27 LAB — LIPID PANEL
Chol/HDL Ratio: 9.5 ratio — ABNORMAL HIGH (ref 0.0–4.4)
Cholesterol, Total: 334 mg/dL — ABNORMAL HIGH (ref 100–199)
HDL: 35 mg/dL — ABNORMAL LOW (ref >39)
LDL Chol Calc (NIH): 247 mg/dL — ABNORMAL HIGH (ref 0–99)
Triglycerides: 247 mg/dL — ABNORMAL HIGH (ref 0–149)
VLDL Cholesterol Cal: 52 mg/dL — ABNORMAL HIGH (ref 5–40)

## 2023-10-27 LAB — TSH: TSH: 0.813 u[IU]/mL (ref 0.450–4.500)

## 2023-10-28 ENCOUNTER — Ambulatory Visit: Payer: Medicaid Other | Admitting: Cardiology

## 2023-10-28 ENCOUNTER — Encounter: Payer: Self-pay | Admitting: Cardiology

## 2023-10-28 VITALS — BP 125/90 | HR 91 | Ht 69.0 in | Wt 231.4 lb

## 2023-10-28 DIAGNOSIS — E119 Type 2 diabetes mellitus without complications: Secondary | ICD-10-CM

## 2023-10-28 DIAGNOSIS — E782 Mixed hyperlipidemia: Secondary | ICD-10-CM

## 2023-10-28 DIAGNOSIS — Z794 Long term (current) use of insulin: Secondary | ICD-10-CM

## 2023-10-28 MED ORDER — REPATHA SURECLICK 140 MG/ML ~~LOC~~ SOAJ: 140.0000 mg | SUBCUTANEOUS | 6 refills | Status: DC

## 2023-10-28 NOTE — Progress Notes (Signed)
Established Patient Office Visit  Subjective:  Patient ID: Crystal Gibson, female    DOB: Aug 30, 1971  Age: 52 y.o. MRN: [id number]  Chief Complaint  Patient presents with   Follow-up    3 mo f/u    Patient in office for 3 month follow up, discuss recent lab results. No acute complaints today. Patient has not been taking her Repatha, states she is unable to get it from the pharmacy. Reports being out of her insulin for 3 weeks, has it now. No change in medications, will recheck labs in 3 months. Patient states her O2 concentrator only lasts about an hour, will place an order for a new one.     No other concerns at this time.   Past Medical History:  Diagnosis Date   Anxiety    Asthma    Bacterial conjunctivitis of right eye 03/16/2023   COVID 2021   Hospitalized with long Covid   Depression    Environmental allergies    Gestational diabetes    Pt is a type 2 diabetes   History of blood clots    when hospitalized for Salmonella poisoning and had Covid at the same time   Lumbar sprain 11/03/2018   Pericarditis 2021   while in hospital for Salmonella poisoning   Sesamoiditis of foot 11/05/2020   Sleep apnea    does not use a cpap    Past Surgical History:  Procedure Laterality Date   ABDOMINAL HYSTERECTOMY     OOPHORECTOMY     WISDOM TOOTH EXTRACTION      Social History   Socioeconomic History   Marital status: Single    Spouse name: Not on file   Number of children: Not on file   Years of education: Not on file   Highest education level: Not on file  Occupational History   Not on file  Tobacco Use   Smoking status: Never   Smokeless tobacco: Never  Vaping Use   Vaping status: Never Used  Substance and Sexual Activity   Alcohol use: No   Drug use: No   Sexual activity: Yes    Birth control/protection: Surgical  Other Topics Concern   Not on file  Social History Narrative   Not on file   Social Determinants of Health   Financial Resource Strain:  High Risk (08/06/2022)   Overall Financial Resource Strain (CARDIA)    Difficulty of Paying Living Expenses: Hard  Food Insecurity: Food Insecurity Present (08/06/2022)   Hunger Vital Sign    Worried About Running Out of Food in the Last Year: Sometimes true    Ran Out of Food in the Last Year: Sometimes true  Transportation Needs: No Transportation Needs (10/13/2022)   PRAPARE - Administrator, Civil Service (Medical): No    Lack of Transportation (Non-Medical): No  Physical Activity: Inactive (09/08/2022)   Exercise Vital Sign    Days of Exercise per Week: 0 days    Minutes of Exercise per Session: 0 min  Stress: Stress Concern Present (06/24/2022)   [employer] of Occupational Health - Occupational Stress Questionnaire    Feeling of Stress : To some extent  Social Connections: Socially Isolated (07/30/2022)   Social Connection and Isolation Panel [NHANES]    Frequency of Communication with Friends and Family: More than three times a week    Frequency of Social Gatherings with Friends and Family: More than three times a week    Attends Religious Services: Never  Active Member of Clubs or Organizations: No    Attends Banker Meetings: Never    Marital Status: Never married  Intimate Partner Violence: Not At Risk (10/13/2022)   Humiliation, Afraid, Rape, and Kick questionnaire    Fear of Current or Ex-Partner: No    Emotionally Abused: No    Physically Abused: No    Sexually Abused: No    Family History  Problem Relation Age of Onset   Breast cancer Sister 65   Breast cancer Maternal Aunt    Breast cancer Paternal Aunt    Breast cancer Maternal Grandmother    Breast cancer Cousin     Allergies  Allergen Reactions   Glucophage [Metformin] Nausea And Vomiting    Review of Systems  Constitutional: Negative.   HENT: Negative.    Eyes: Negative.   Respiratory: Negative.  Negative for shortness of breath.   Cardiovascular: Negative.  Negative for  chest pain.  Gastrointestinal: Negative.  Negative for abdominal pain, constipation and diarrhea.  Genitourinary: Negative.   Musculoskeletal:  Negative for joint pain and myalgias.  Skin: Negative.   Neurological: Negative.  Negative for dizziness and headaches.  Endo/Heme/Allergies: Negative.   All other systems reviewed and are negative.      Objective:   BP (!) 125/90   Pulse 91   Ht 5\' 9"  (1.753 m)   Wt 231 lb 6.4 oz (105 kg)   SpO2 99%   BMI 34.17 kg/m   Vitals:   10/28/23 1116  BP: (!) 125/90  Pulse: 91  Height: 5\' 9"  (1.753 m)  Weight: 231 lb 6.4 oz (105 kg)  SpO2: 99%  BMI (Calculated): 34.16    Physical Exam Vitals and nursing note reviewed.  Constitutional:      Appearance: Normal appearance. She is normal weight.  HENT:     Head: Normocephalic and atraumatic.     Nose: Nose normal.     Mouth/Throat:     Mouth: Mucous membranes are moist.  Eyes:     Extraocular Movements: Extraocular movements intact.     Conjunctiva/sclera: Conjunctivae normal.     Pupils: Pupils are equal, round, and reactive to light.  Cardiovascular:     Rate and Rhythm: Normal rate and regular rhythm.     Pulses: Normal pulses.     Heart sounds: Normal heart sounds.  Pulmonary:     Effort: Pulmonary effort is normal.     Breath sounds: Normal breath sounds.  Abdominal:     General: Abdomen is flat. Bowel sounds are normal.     Palpations: Abdomen is soft.  Musculoskeletal:        General: Normal range of motion.     Cervical back: Normal range of motion.  Skin:    General: Skin is warm and dry.  Neurological:     General: No focal deficit present.     Mental Status: She is alert and oriented to person, place, and time.  Psychiatric:        Mood and Affect: Mood normal.        Behavior: Behavior normal.        Thought Content: Thought content normal.        Judgment: Judgment normal.      No results found for any visits on 10/28/23.  Recent Results (from the  past 2160 hour(s))  Lipid panel     Status: Abnormal   Collection Time: 10/26/23  1:54 PM  Result Value Ref Range   Cholesterol, Total  334 (H) 100 - 199 mg/dL   Triglycerides 244 (H) 0 - 149 mg/dL   HDL 35 (L) >01 mg/dL   VLDL Cholesterol Cal 52 (H) 5 - 40 mg/dL   LDL Chol Calc (NIH) 027 (H) 0 - 99 mg/dL   LDL CALC COMMENT: Comment     Comment: Consider evaluating for Familial Hypercholesterolemia(FH), if clinically indicated.    Chol/HDL Ratio 9.5 (H) 0.0 - 4.4 ratio    Comment:                                   T. Chol/HDL Ratio                                             Men  Women                               1/2 Avg.Risk  3.4    3.3                                   Avg.Risk  5.0    4.4                                2X Avg.Risk  9.6    7.1                                3X Avg.Risk 23.4   11.0   Hemoglobin A1c     Status: Abnormal   Collection Time: 10/26/23  1:54 PM  Result Value Ref Range   Hgb A1c MFr Bld 9.5 (H) 4.8 - 5.6 %    Comment:          Prediabetes: 5.7 - 6.4          Diabetes: >6.4          Glycemic control for adults with diabetes: <7.0    Est. average glucose Bld gHb Est-mCnc 226 mg/dL  TSH     Status: None   Collection Time: 10/26/23  1:54 PM  Result Value Ref Range   TSH 0.813 0.450 - 4.500 uIU/mL  CMP14+EGFR     Status: Abnormal   Collection Time: 10/26/23  1:54 PM  Result Value Ref Range   Glucose 165 (H) 70 - 99 mg/dL   BUN 10 6 - 24 mg/dL   Creatinine, Ser 2.53 0.57 - 1.00 mg/dL   eGFR 87 >66 YQ/IHK/7.42   BUN/Creatinine Ratio 12 9 - 23   Sodium 140 134 - 144 mmol/L   Potassium 4.0 3.5 - 5.2 mmol/L   Chloride 99 96 - 106 mmol/L   CO2 24 20 - 29 mmol/L   Calcium 9.7 8.7 - 10.2 mg/dL   Total Protein 7.8 6.0 - 8.5 g/dL   Albumin 4.5 3.8 - 4.9 g/dL   Globulin, Total 3.3 1.5 - 4.5 g/dL   Bilirubin Total 0.4 0.0 - 1.2 mg/dL   Alkaline Phosphatase 81 44 - 121 IU/L   AST 29 0 - 40 IU/L   ALT 53 (H) 0 - 32 IU/L  Assessment & Plan:   Continue same medications.  Will check with pharmacy for PA for Repatha Will order a new O2 concentrator.   Problem List Items Addressed This Visit       Endocrine   Type 2 diabetes mellitus without complication, with long-term current use of insulin (HCC) - Primary     Other   Mixed hyperlipidemia   Relevant Medications   Evolocumab (REPATHA SURECLICK) 140 MG/ML SOAJ    Return in about 3 months (around 01/28/2024) for with lab work prior.   Total time spent: 25 minutes  [employer], NP  10/28/2023   This document may have been prepared by Dragon Voice Recognition software and as such may include unintentional dictation errors.

## 2023-10-30 ENCOUNTER — Telehealth: Payer: Self-pay | Admitting: Cardiology

## 2023-10-30 NOTE — Telephone Encounter (Signed)
Called to see if we were going to do another PA for her Repatha. Requesting a call back.

## 2023-11-06 ENCOUNTER — Other Ambulatory Visit: Payer: Medicaid Other

## 2023-11-17 ENCOUNTER — Ambulatory Visit (INDEPENDENT_AMBULATORY_CARE_PROVIDER_SITE_OTHER): Payer: Medicaid Other | Admitting: Cardiovascular Disease

## 2023-11-17 ENCOUNTER — Encounter: Payer: Self-pay | Admitting: Cardiovascular Disease

## 2023-11-17 VITALS — BP 118/78 | HR 92 | Ht 68.5 in | Wt 231.2 lb

## 2023-11-17 DIAGNOSIS — G4733 Obstructive sleep apnea (adult) (pediatric): Secondary | ICD-10-CM | POA: Diagnosis not present

## 2023-11-17 DIAGNOSIS — R0789 Other chest pain: Secondary | ICD-10-CM

## 2023-11-17 DIAGNOSIS — E782 Mixed hyperlipidemia: Secondary | ICD-10-CM

## 2023-11-17 DIAGNOSIS — I4711 Inappropriate sinus tachycardia, so stated: Secondary | ICD-10-CM

## 2023-11-17 DIAGNOSIS — R55 Syncope and collapse: Secondary | ICD-10-CM | POA: Diagnosis not present

## 2023-11-17 DIAGNOSIS — I10 Essential (primary) hypertension: Secondary | ICD-10-CM

## 2023-11-17 NOTE — Progress Notes (Signed)
Cardiology Office Note   Date:  11/17/2023   ID:  ADIRA TRAUTWEIN, DOB 05-Oct-1971, MRN [id number]  PCP:  Marisue Ivan, NP  Cardiologist:  Adrian Blackwater, MD      History of Present Illness: Crystal Gibson is a 52 y.o. female who presents for  Chief Complaint  Patient presents with   Follow-up    2 month follow up    Had fallen once, still has chest pains  Chest Pain  This is a recurrent problem. The current episode started more than 1 month ago. The problem has been waxing and waning. The pain is present in the substernal region. The pain is at a severity of 4/10.      Past Medical History:  Diagnosis Date   Anxiety    Asthma    Bacterial conjunctivitis of right eye 03/16/2023   COVID 2021   Hospitalized with long Covid   Depression    Environmental allergies    Gestational diabetes    Pt is a type 2 diabetes   History of blood clots    when hospitalized for Salmonella poisoning and had Covid at the same time   Lumbar sprain 11/03/2018   Pericarditis 2021   while in hospital for Salmonella poisoning   Sesamoiditis of foot 11/05/2020   Sleep apnea    does not use a cpap     Past Surgical History:  Procedure Laterality Date   ABDOMINAL HYSTERECTOMY     OOPHORECTOMY     WISDOM TOOTH EXTRACTION       Current Outpatient Medications  Medication Sig Dispense Refill   ACCU-CHEK FASTCLIX LANCETS MISC   2   ADVAIR HFA 115-21 MCG/ACT inhaler SMARTSIG:2 Puff(s) By Mouth Twice Daily     ARIPiprazole (ABILIFY) 15 MG tablet Take 15 mg by mouth daily.     baclofen (LIORESAL) 10 MG tablet Take 10 mg by mouth 3 (three) times daily.     BD PEN NEEDLE NANO 2ND GEN 32G X 4 MM MISC USE THREE TIMES DAILY BEFORE MEALS     Blood Glucose Monitoring Suppl (ACCU-CHEK GUIDE) w/Device KIT USE DEVICE TO CHECK SUGARS DAILY     Butalbital-APAP-Caffeine 50-300-40 MG CAPS TAKE 1 CAPSULE BY MOUTH EVERY 6 HOURS AS NEEDED FOR PERSISTENT HEADACHE 120 capsule 0   Continuous Glucose  Sensor (DEXCOM G6 SENSOR) MISC USE WITH DEXCOM RECEIVER AND TRANSMITTER TO TRACK BLOOD SUGAR VALUES CHANGE SENOR EVERY 10 DAYS AS DIRECTED 9 each 3   Continuous Glucose Transmitter (DEXCOM G6 TRANSMITTER) MISC 1 Box by Does not apply route as needed. 2 each 2   dapagliflozin propanediol (FARXIGA) 10 MG TABS tablet TAKE 1 TABLET BY MOUTH DAILY FOR DIABETES 90 tablet 1   digoxin (LANOXIN) 0.25 MG tablet Take 1 tablet (250 mcg total) by mouth daily. 90 tablet 1   Doxycycline & Benzoyl Peroxide 30 x 100 MG & 4.4% THPK 5 mLs by Combination route at bedtime. 60 each 5   DULoxetine (CYMBALTA) 60 MG capsule Take 60 mg by mouth daily.  0   EPINEPHrine 0.3 mg/0.3 mL IJ SOAJ injection Inject 0.3 mg into the muscle as needed for anaphylaxis. 1 each 6   Evolocumab (REPATHA SURECLICK) 140 MG/ML SOAJ Inject 140 mg into the skin every 14 (fourteen) days. 2 mL 6   famotidine (PEPCID) 20 MG tablet TAKE 1 TABLET BY MOUTH EVERY DAY 90 tablet 1   fexofenadine-pseudoephedrine (ALLEGRA-D) 60-120 MG 12 hr tablet Take 1 tablet by mouth 2 (two)  times daily. 30 tablet 11   fluticasone (FLONASE) 50 MCG/ACT nasal spray Place 1 spray into both nostrils 2 (two) times daily. 16 g 0   furosemide (LASIX) 20 MG tablet Take 1 tablet (20 mg total) by mouth daily. (Patient taking differently: Take 40 mg by mouth daily.) 90 tablet 1   gabapentin (NEURONTIN) 300 MG capsule Take 1 capsule (300 mg total) by mouth 3 (three) times daily. 90 capsule 3   glucose blood (ACCU-CHEK GUIDE) test strip Accu-Chek Guide In Vitro Strip QTY: 200 strip Days: 90 Refills: 1  Written: 04/27/20 Patient Instructions: TEST FASTING BLOOD SUGAR EVERY MORNING FASTING AND EVERY EVENING E11.65     icosapent Ethyl (VASCEPA) 1 g capsule Take 2 capsules (2 g total) by mouth 2 (two) times daily. 120 capsule 1   Insulin Pen Needle (GLOBAL EASY GLIDE PEN NEEDLES) 32G X 4 MM MISC Inject 4 Needles into the skin in the morning, at noon, in the evening, and at bedtime. 100 each  5   ivabradine (CORLANOR) 7.5 MG TABS tablet Take 1 tablet (7.5 mg total) by mouth 2 (two) times daily. 180 tablet 1   Lancets Misc. (ACCU-CHEK FASTCLIX LANCET) KIT Accu-Chek Fastclix Lancet Drum  USE TO CHECK FASTING IN AM AND AS NEEDED FOR HIGH OR LOW     metoprolol succinate (TOPROL-XL) 100 MG 24 hr tablet Take 2 tablets (200 mg total) by mouth daily. 90 tablet 1   NOVOLOG FLEXPEN 100 UNIT/ML FlexPen Inject 10 Units into the skin 3 (three) times daily with meals. 15 mL 11   olmesartan (BENICAR) 5 MG tablet Take 1 tablet (5 mg total) by mouth daily. 90 tablet 1   ONETOUCH VERIO test strip 1 each 4 (four) times daily.     OXYGEN Inhale 2 L into the lungs as needed (exertion).     pantoprazole (PROTONIX) 40 MG tablet Take 1 tablet (40 mg total) by mouth 2 (two) times daily. 180 tablet 3   potassium chloride (KLOR-CON M) 10 MEQ tablet TAKE 1 TABLET BY MOUTH EVERY DAILY WITH FUROSEMIDE TO PREVENT POTASSIUM DEPLETION 30 tablet 11   Rimegepant Sulfate (NURTEC) 75 MG TBDP Take 1 tablet (75 mg total) by mouth over 48 hr. 12 tablet 3   rosuvastatin (CRESTOR) 40 MG tablet Take 1 tablet (40 mg total) by mouth daily. 90 tablet 1   Semaglutide (RYBELSUS) 14 MG TABS Take 1 tablet (14 mg total) by mouth daily. 30 tablet 6   sucralfate (CARAFATE) 1 g tablet Take 1 tablet (1 g total) by mouth 4 (four) times daily. 120 tablet 1   SYMBICORT 160-4.5 MCG/ACT inhaler Inhale 2 puffs into the lungs in the morning and at bedtime.     topiramate (TOPAMAX) 25 MG tablet Take 50 mg by mouth at bedtime.     traZODone (DESYREL) 100 MG tablet Take 200 mg by mouth at bedtime.      TRESIBA FLEXTOUCH 200 UNIT/ML FlexTouch Pen ADMINISTER 48 UNITS UNDER THE SKIN DAILY 9 mL 3   doxycycline (VIBRA-TABS) 100 MG tablet Take 1 tablet (100 mg total) by mouth 2 (two) times daily. (Patient not taking: Reported on 11/17/2023) 30 tablet 3   No current facility-administered medications for this visit.    Allergies:   Glucophage  [metformin]    Social History:   reports that she has never smoked. She has never used smokeless tobacco. She reports that she does not drink alcohol and does not use drugs.   Family History:  family history  includes Breast cancer in her cousin, maternal aunt, maternal grandmother, and paternal aunt; Breast cancer (age of onset: 56) in her sister.    ROS:     Review of Systems  Constitutional: Negative.   HENT: Negative.    Eyes: Negative.   Respiratory: Negative.    Cardiovascular:  Positive for chest pain.  Gastrointestinal: Negative.   Genitourinary: Negative.   Musculoskeletal: Negative.   Skin: Negative.   Neurological: Negative.   Endo/Heme/Allergies: Negative.   Psychiatric/Behavioral: Negative.    All other systems reviewed and are negative.     All other systems are reviewed and negative.    PHYSICAL EXAM: VS:  BP 118/78   Pulse 92   Ht 5' 8.5" (1.74 m)   Wt 231 lb 3.2 oz (104.9 kg)   SpO2 97%   BMI 34.64 kg/m  , BMI Body mass index is 34.64 kg/m. Last weight:  Wt Readings from Last 3 Encounters:  11/17/23 231 lb 3.2 oz (104.9 kg)  10/28/23 231 lb 6.4 oz (105 kg)  09/18/23 238 lb (108 kg)     Physical Exam Constitutional:      Appearance: Normal appearance.  Cardiovascular:     Rate and Rhythm: Normal rate and regular rhythm.     Heart sounds: Normal heart sounds.  Pulmonary:     Effort: Pulmonary effort is normal.     Breath sounds: Normal breath sounds.  Musculoskeletal:     Right lower leg: No edema.     Left lower leg: No edema.  Neurological:     Mental Status: She is alert.       EKG:   Recent Labs: 10/26/2023: ALT 53; BUN 10; Creatinine, Ser 0.81; Potassium 4.0; Sodium 140; TSH 0.813    Lipid Panel    Component Value Date/Time   CHOL 334 (H) 10/26/2023 1354   TRIG 247 (H) 10/26/2023 1354   HDL 35 (L) 10/26/2023 1354   CHOLHDL 9.5 (H) 10/26/2023 1354   LDLCALC 247 (H) 10/26/2023 1354      Other studies  Reviewed: Additional studies/ records that were reviewed today include:  Review of the above records demonstrates:       No data to display            ASSESSMENT AND PLAN:    ICD-10-CM   1. Inappropriate sinus tachycardia (HCC)  I47.11 Comprehensive metabolic panel    2. OSA (obstructive sleep apnea)  G47.33 Comprehensive metabolic panel    3. Essential hypertension  I10 Comprehensive metabolic panel    4. Mixed hyperlipidemia  E78.2 Comprehensive metabolic panel    5. Syncope, unspecified syncope type  R55 Comprehensive metabolic panel   Unclear, pulse oximetry showed low saturation, as on home O2. CCTA had normal coronaries, LVEF normal on echo. Check labs    6. Chest pain, non-cardiac  R07.89 Comprehensive metabolic panel   0981, ccta showed Normal coronaries, and stress test 2/24 normal.       Problem List Items Addressed This Visit       Cardiovascular and Mediastinum   Inappropriate sinus tachycardia (HCC) - Primary   Relevant Orders   Comprehensive metabolic panel     Respiratory   OSA (obstructive sleep apnea)   Relevant Orders   Comprehensive metabolic panel     Other   Mixed hyperlipidemia   Relevant Orders   Comprehensive metabolic panel   Other Visit Diagnoses     Essential hypertension       Relevant Orders   Comprehensive  metabolic panel   Syncope, unspecified syncope type       Unclear, pulse oximetry showed low saturation, as on home O2. CCTA had normal coronaries, LVEF normal on echo. Check labs   Relevant Orders   Comprehensive metabolic panel   Chest pain, non-cardiac       2023, ccta showed Normal coronaries, and stress test 2/24 normal.   Relevant Orders   Comprehensive metabolic panel          Disposition:   Return in about 2 months (around 01/17/2024).    Total time spent: 30 minutes  Signed,  Adrian Blackwater, MD  11/17/2023 10:37 AM    [facility]

## 2023-11-19 DIAGNOSIS — M79652 Pain in left thigh: Secondary | ICD-10-CM | POA: Diagnosis not present

## 2023-11-19 DIAGNOSIS — M542 Cervicalgia: Secondary | ICD-10-CM | POA: Diagnosis not present

## 2023-11-19 DIAGNOSIS — R0789 Other chest pain: Secondary | ICD-10-CM | POA: Diagnosis not present

## 2023-11-19 DIAGNOSIS — R079 Chest pain, unspecified: Secondary | ICD-10-CM | POA: Diagnosis not present

## 2023-11-19 DIAGNOSIS — M25552 Pain in left hip: Secondary | ICD-10-CM | POA: Diagnosis not present

## 2023-11-19 DIAGNOSIS — R519 Headache, unspecified: Secondary | ICD-10-CM | POA: Diagnosis not present

## 2023-11-19 DIAGNOSIS — M79602 Pain in left arm: Secondary | ICD-10-CM | POA: Diagnosis not present

## 2023-11-19 DIAGNOSIS — R9431 Abnormal electrocardiogram [ECG] [EKG]: Secondary | ICD-10-CM | POA: Diagnosis not present

## 2023-11-19 DIAGNOSIS — M79622 Pain in left upper arm: Secondary | ICD-10-CM | POA: Diagnosis not present

## 2023-11-19 DIAGNOSIS — S0990XA Unspecified injury of head, initial encounter: Secondary | ICD-10-CM | POA: Diagnosis not present

## 2023-12-07 ENCOUNTER — Encounter: Payer: Self-pay | Admitting: Family

## 2023-12-07 ENCOUNTER — Ambulatory Visit: Payer: Medicaid Other | Admitting: Family

## 2023-12-07 ENCOUNTER — Ambulatory Visit
Admission: RE | Admit: 2023-12-07 | Discharge: 2023-12-07 | Disposition: A | Discharge status: Home / Self Care | Payer: Medicaid Other | Attending: Family | Admitting: Family

## 2023-12-07 ENCOUNTER — Ambulatory Visit
Admission: RE | Admit: 2023-12-07 | Discharge: 2023-12-07 | Disposition: A | Discharge status: Home / Self Care | Payer: Medicaid Other | Source: Ambulatory Visit | Attending: Family | Admitting: Family

## 2023-12-07 VITALS — BP 120/80 | HR 111 | Ht 68.5 in | Wt 230.6 lb

## 2023-12-07 DIAGNOSIS — Z794 Long term (current) use of insulin: Secondary | ICD-10-CM

## 2023-12-07 DIAGNOSIS — R042 Hemoptysis: Secondary | ICD-10-CM | POA: Diagnosis not present

## 2023-12-07 DIAGNOSIS — R0602 Shortness of breath: Secondary | ICD-10-CM

## 2023-12-07 DIAGNOSIS — E119 Type 2 diabetes mellitus without complications: Secondary | ICD-10-CM

## 2023-12-07 LAB — GLUCOSE, POCT (MANUAL RESULT ENTRY): POC Glucose: 412 mg/dL — AB (ref 70–99)

## 2023-12-07 MED ORDER — LEVOFLOXACIN 500 MG PO TABS: 500.0000 mg | ORAL_TABLET | Freq: Every day | ORAL | 0 refills | Status: AC

## 2023-12-07 NOTE — Progress Notes (Unsigned)
Acute Office Visit  Subjective:     Patient ID: Crystal Gibson, female    DOB: 06-24-1971, 52 y.o.   MRN: [id number]  Patient is in today for  Chief Complaint  Patient presents with   Acute Visit    SOB, coughing up bloody phelgm    Fallen a few times due to dizziness,  Coughed up two large "globs" of bloody phlegm Ear pain Sinus pressure.    Sinus inf vs ear infection     Review of Systems  Constitutional:  Positive for malaise/fatigue.  HENT:  Positive for congestion, ear pain and sinus pain.   Respiratory:  Positive for cough, hemoptysis, sputum production and shortness of breath.   Neurological:  Positive for dizziness.        Objective:    BP 120/80   Pulse (!) 111   Ht 5' 8.5" (1.74 m)   Wt 230 lb 9.6 oz (104.6 kg)   SpO2 98%   BMI 34.55 kg/m   Physical Exam Vitals and nursing note reviewed.  Constitutional:      Appearance: Normal appearance. She is normal weight.  HENT:     Head: Normocephalic and atraumatic.     Right Ear: Tympanic membrane is erythematous.     Left Ear: Tympanic membrane is erythematous and bulging.     Nose:     Right Turbinates: Enlarged.     Left Turbinates: Enlarged.     Right Sinus: Maxillary sinus tenderness and frontal sinus tenderness present.     Left Sinus: Maxillary sinus tenderness and frontal sinus tenderness present.  Eyes:     Extraocular Movements: Extraocular movements intact.     Conjunctiva/sclera: Conjunctivae normal.     Pupils: Pupils are equal, round, and reactive to light.  Cardiovascular:     Rate and Rhythm: Normal rate.  Pulmonary:     Breath sounds: Wheezing present.  Neurological:     General: No focal deficit present.     Mental Status: She is alert and oriented to person, place, and time. Mental status is at baseline.  Psychiatric:        Mood and Affect: Mood normal.        Behavior: Behavior normal.        Thought Content: Thought content normal.        Judgment: Judgment normal.      Results for orders placed or performed in visit on 12/07/23  POCT Glucose (CBG)  Result Value Ref Range   POC Glucose 412 (A) 70 - 99 mg/dl    Recent Results (from the past 2160 hour(s))  Lipid panel     Status: Abnormal   Collection Time: 10/26/23  1:54 PM  Result Value Ref Range   Cholesterol, Total 334 (H) 100 - 199 mg/dL   Triglycerides 409 (H) 0 - 149 mg/dL   HDL 35 (L) >81 mg/dL   VLDL Cholesterol Cal 52 (H) 5 - 40 mg/dL   LDL Chol Calc (NIH) 191 (H) 0 - 99 mg/dL   LDL CALC COMMENT: Comment     Comment: Consider evaluating for Familial Hypercholesterolemia(FH), if clinically indicated.    Chol/HDL Ratio 9.5 (H) 0.0 - 4.4 ratio    Comment:                                   T. Chol/HDL Ratio  Men  Women                               1/2 Avg.Risk  3.4    3.3                                   Avg.Risk  5.0    4.4                                2X Avg.Risk  9.6    7.1                                3X Avg.Risk 23.4   11.0   Hemoglobin A1c     Status: Abnormal   Collection Time: 10/26/23  1:54 PM  Result Value Ref Range   Hgb A1c MFr Bld 9.5 (H) 4.8 - 5.6 %    Comment:          Prediabetes: 5.7 - 6.4          Diabetes: >6.4          Glycemic control for adults with diabetes: <7.0    Est. average glucose Bld gHb Est-mCnc 226 mg/dL  TSH     Status: None   Collection Time: 10/26/23  1:54 PM  Result Value Ref Range   TSH 0.813 0.450 - 4.500 uIU/mL  CMP14+EGFR     Status: Abnormal   Collection Time: 10/26/23  1:54 PM  Result Value Ref Range   Glucose 165 (H) 70 - 99 mg/dL   BUN 10 6 - 24 mg/dL   Creatinine, Ser 1.61 0.57 - 1.00 mg/dL   eGFR 87 >09 UE/AVW/0.98   BUN/Creatinine Ratio 12 9 - 23   Sodium 140 134 - 144 mmol/L   Potassium 4.0 3.5 - 5.2 mmol/L   Chloride 99 96 - 106 mmol/L   CO2 24 20 - 29 mmol/L   Calcium 9.7 8.7 - 10.2 mg/dL   Total Protein 7.8 6.0 - 8.5 g/dL   Albumin 4.5 3.8 - 4.9 g/dL   Globulin, Total  3.3 1.5 - 4.5 g/dL   Bilirubin Total 0.4 0.0 - 1.2 mg/dL   Alkaline Phosphatase 81 44 - 121 IU/L   AST 29 0 - 40 IU/L   ALT 53 (H) 0 - 32 IU/L  POCT Glucose (CBG)     Status: Abnormal   Collection Time: 12/07/23  2:36 PM  Result Value Ref Range   POC Glucose 412 (A) 70 - 99 mg/dl    Allergies as of 11/06/1477       Reactions   Glucophage [metformin] Nausea And Vomiting        Medication List        Accurate as of December 07, 2023 11:59 PM. If you have any questions, ask your nurse or doctor.          STOP taking these medications    doxycycline 100 MG tablet Commonly known as: VIBRA-TABS Stopped by: Miki Kins       TAKE these medications    Accu-Chek FastClix Lancet Kit Accu-Chek Fastclix Lancet Drum  USE TO CHECK FASTING IN AM AND AS NEEDED FOR HIGH OR LOW   Accu-Chek FastClix Lancets Misc   Accu-Chek  Guide test strip Generic drug: glucose blood Accu-Chek Guide In Vitro Strip QTY: 200 strip Days: 90 Refills: 1  Written: 04/27/20 Patient Instructions: TEST FASTING BLOOD SUGAR EVERY MORNING FASTING AND EVERY EVENING E11.65   OneTouch Verio test strip Generic drug: glucose blood 1 each 4 (four) times daily.   Accu-Chek Guide w/Device Kit USE DEVICE TO CHECK SUGARS DAILY   Advair HFA 115-21 MCG/ACT inhaler Generic drug: fluticasone-salmeterol SMARTSIG:2 Puff(s) By Mouth Twice Daily   ARIPiprazole 15 MG tablet Commonly known as: ABILIFY Take 15 mg by mouth daily.   baclofen 10 MG tablet Commonly known as: LIORESAL Take 10 mg by mouth 3 (three) times daily.   BD Pen Needle Nano 2nd Gen 32G X 4 MM Misc Generic drug: Insulin Pen Needle USE THREE TIMES DAILY BEFORE MEALS   Global Easy Glide Pen Needles 32G X 4 MM Misc Generic drug: Insulin Pen Needle Inject 4 Needles into the skin in the morning, at noon, in the evening, and at bedtime.   Butalbital-APAP-Caffeine 50-300-40 MG Caps TAKE 1 CAPSULE BY MOUTH EVERY 6 HOURS AS NEEDED FOR  PERSISTENT HEADACHE   dapagliflozin propanediol 10 MG Tabs tablet Commonly known as: FARXIGA TAKE 1 TABLET BY MOUTH DAILY FOR DIABETES   Dexcom G6 Sensor Misc USE WITH DEXCOM RECEIVER AND TRANSMITTER TO TRACK BLOOD SUGAR VALUES CHANGE SENOR EVERY 10 DAYS AS DIRECTED   Dexcom G6 Transmitter Misc 1 Box by Does not apply route as needed.   digoxin 0.25 MG tablet Commonly known as: LANOXIN Take 1 tablet (250 mcg total) by mouth daily.   Doxycycline & Benzoyl Peroxide 30 x 100 MG & 4.4% Thpk 5 mLs by Combination route at bedtime.   DULoxetine 60 MG capsule Commonly known as: CYMBALTA Take 60 mg by mouth daily.   EPINEPHrine 0.3 mg/0.3 mL Soaj injection Commonly known as: EPI-PEN Inject 0.3 mg into the muscle as needed for anaphylaxis.   famotidine 20 MG tablet Commonly known as: PEPCID TAKE 1 TABLET BY MOUTH EVERY DAY   fexofenadine-pseudoephedrine 60-120 MG 12 hr tablet Commonly known as: ALLEGRA-D Take 1 tablet by mouth 2 (two) times daily.   fluticasone 50 MCG/ACT nasal spray Commonly known as: Flonase Place 1 spray into both nostrils 2 (two) times daily.   furosemide 20 MG tablet Commonly known as: LASIX Take 1 tablet (20 mg total) by mouth daily. What changed: how much to take   gabapentin 300 MG capsule Commonly known as: NEURONTIN Take 1 capsule (300 mg total) by mouth 3 (three) times daily.   icosapent Ethyl 1 g capsule Commonly known as: VASCEPA Take 2 capsules (2 g total) by mouth 2 (two) times daily.   ivabradine 7.5 MG Tabs tablet Commonly known as: Corlanor Take 1 tablet (7.5 mg total) by mouth 2 (two) times daily.   levofloxacin 500 MG tablet Commonly known as: LEVAQUIN Take 1 tablet (500 mg total) by mouth daily for 7 days. Started by: Miki Kins   metoprolol succinate 100 MG 24 hr tablet Commonly known as: TOPROL-XL Take 2 tablets (200 mg total) by mouth daily.   NovoLOG FlexPen 100 UNIT/ML FlexPen Generic drug: insulin aspart Inject  10 Units into the skin 3 (three) times daily with meals.   Nurtec 75 MG Tbdp Generic drug: Rimegepant Sulfate Take 1 tablet (75 mg total) by mouth over 48 hr.   olmesartan 5 MG tablet Commonly known as: BENICAR Take 1 tablet (5 mg total) by mouth daily.   OXYGEN Inhale 2 L into  the lungs as needed (exertion).   pantoprazole 40 MG tablet Commonly known as: PROTONIX Take 1 tablet (40 mg total) by mouth 2 (two) times daily.   potassium chloride 10 MEQ tablet Commonly known as: KLOR-CON M TAKE 1 TABLET BY MOUTH EVERY DAILY WITH FUROSEMIDE TO PREVENT POTASSIUM DEPLETION   Repatha SureClick 140 MG/ML Soaj Generic drug: Evolocumab Inject 140 mg into the skin every 14 (fourteen) days.   rosuvastatin 40 MG tablet Commonly known as: CRESTOR Take 1 tablet (40 mg total) by mouth daily.   Rybelsus 14 MG Tabs Generic drug: Semaglutide Take 1 tablet (14 mg total) by mouth daily.   sucralfate 1 g tablet Commonly known as: CARAFATE Take 1 tablet (1 g total) by mouth 4 (four) times daily.   Symbicort 160-4.5 MCG/ACT inhaler Generic drug: budesonide-formoterol Inhale 2 puffs into the lungs in the morning and at bedtime.   topiramate 25 MG tablet Commonly known as: TOPAMAX Take 50 mg by mouth at bedtime.   traZODone 100 MG tablet Commonly known as: DESYREL Take 200 mg by mouth at bedtime.   Evaristo Bury FlexTouch 200 UNIT/ML FlexTouch Pen Generic drug: insulin degludec ADMINISTER 48 UNITS UNDER THE SKIN DAILY            Assessment & Plan:   Problem List Items Addressed This Visit       Active Problems   Type 2 diabetes mellitus without complication, with long-term current use of insulin (HCC)   Relevant Orders   POCT Glucose (CBG) (Completed)   Other Visit Diagnoses     Hemoptysis    -  Primary   Relevant Orders   DG Chest 2 View (Completed)   Shortness of breath       Relevant Orders   DG Chest 2 View (Completed)      CXR ordered today.  Sending antibiotics for  pt.  Will call with results of CXR.   Return if symptoms worsen or fail to improve.  Total time spent: 20 minutes  Miki Kins, FNP  12/07/2023  This document may have been prepared by [facility] Voice Recognition software and as such may include unintentional dictation errors.

## 2023-12-08 ENCOUNTER — Encounter: Payer: Self-pay | Admitting: Family

## 2023-12-08 NOTE — Progress Notes (Signed)
Patient notified

## 2023-12-16 ENCOUNTER — Telehealth: Payer: Self-pay | Admitting: Cardiology

## 2023-12-16 NOTE — Telephone Encounter (Signed)
Pt called stating she misplaced her Levofloxin & needs another one called in [facility] in [place]

## 2023-12-17 MED ORDER — LEVOFLOXACIN 500 MG PO TABS: 500.0000 mg | ORAL_TABLET | Freq: Every day | ORAL | 0 refills | Status: AC

## 2024-01-11 ENCOUNTER — Other Ambulatory Visit: Payer: Self-pay

## 2024-01-12 MED ORDER — BD PEN NEEDLE NANO 2ND GEN 32G X 4 MM MISC: 3 refills | Status: DC

## 2024-01-14 DIAGNOSIS — R0602 Shortness of breath: Secondary | ICD-10-CM | POA: Diagnosis not present

## 2024-01-15 ENCOUNTER — Other Ambulatory Visit: Payer: Medicaid Other

## 2024-01-15 DIAGNOSIS — I10 Essential (primary) hypertension: Secondary | ICD-10-CM | POA: Diagnosis not present

## 2024-01-15 DIAGNOSIS — G4733 Obstructive sleep apnea (adult) (pediatric): Secondary | ICD-10-CM | POA: Diagnosis not present

## 2024-01-15 DIAGNOSIS — R55 Syncope and collapse: Secondary | ICD-10-CM | POA: Diagnosis not present

## 2024-01-15 DIAGNOSIS — R0789 Other chest pain: Secondary | ICD-10-CM | POA: Diagnosis not present

## 2024-01-15 DIAGNOSIS — I4711 Inappropriate sinus tachycardia, so stated: Secondary | ICD-10-CM | POA: Diagnosis not present

## 2024-01-15 DIAGNOSIS — E782 Mixed hyperlipidemia: Secondary | ICD-10-CM | POA: Diagnosis not present

## 2024-01-16 LAB — COMPREHENSIVE METABOLIC PANEL
ALT: 46 [IU]/L — ABNORMAL HIGH (ref 0–32)
AST: 26 [IU]/L (ref 0–40)
Albumin: 4.5 g/dL (ref 3.8–4.9)
Alkaline Phosphatase: 85 [IU]/L (ref 44–121)
BUN/Creatinine Ratio: 8 — ABNORMAL LOW (ref 9–23)
BUN: 7 mg/dL (ref 6–24)
Bilirubin Total: 0.6 mg/dL (ref 0.0–1.2)
CO2: 23 mmol/L (ref 20–29)
Calcium: 9.7 mg/dL (ref 8.7–10.2)
Chloride: 99 mmol/L (ref 96–106)
Creatinine, Ser: 0.91 mg/dL (ref 0.57–1.00)
Globulin, Total: 3.1 g/dL (ref 1.5–4.5)
Glucose: 311 mg/dL — ABNORMAL HIGH (ref 70–99)
Potassium: 4.5 mmol/L (ref 3.5–5.2)
Sodium: 140 mmol/L (ref 134–144)
Total Protein: 7.6 g/dL (ref 6.0–8.5)
eGFR: 76 mL/min/{1.73_m2} (ref >59)

## 2024-01-18 ENCOUNTER — Encounter: Payer: Self-pay | Admitting: Cardiovascular Disease

## 2024-01-18 ENCOUNTER — Ambulatory Visit: Payer: Medicaid Other | Admitting: Cardiovascular Disease

## 2024-01-18 ENCOUNTER — Ambulatory Visit: Payer: Medicaid Other | Admitting: Cardiology

## 2024-01-18 VITALS — BP 100/60 | HR 96 | Ht 68.5 in | Wt 228.0 lb

## 2024-01-18 DIAGNOSIS — R55 Syncope and collapse: Secondary | ICD-10-CM

## 2024-01-18 DIAGNOSIS — R0789 Other chest pain: Secondary | ICD-10-CM | POA: Diagnosis not present

## 2024-01-18 DIAGNOSIS — E782 Mixed hyperlipidemia: Secondary | ICD-10-CM | POA: Diagnosis not present

## 2024-01-18 DIAGNOSIS — G4733 Obstructive sleep apnea (adult) (pediatric): Secondary | ICD-10-CM

## 2024-01-18 DIAGNOSIS — I4711 Inappropriate sinus tachycardia, so stated: Secondary | ICD-10-CM

## 2024-01-18 DIAGNOSIS — Z1231 Encounter for screening mammogram for malignant neoplasm of breast: Secondary | ICD-10-CM

## 2024-01-18 DIAGNOSIS — I10 Essential (primary) hypertension: Secondary | ICD-10-CM

## 2024-01-18 MED ORDER — REPATHA SURECLICK 140 MG/ML ~~LOC~~ SOAJ: 140.0000 mg | SUBCUTANEOUS | 6 refills | Status: DC

## 2024-01-18 NOTE — Progress Notes (Signed)
Cardiology Office Note   Date:  01/18/2024   ID:  Crystal Gibson, DOB [birth year], MRN [id number]  PCP:  Marisue Ivan, NP  Cardiologist:  Adrian Blackwater, MD      History of Present Illness: Crystal Gibson is a 53 y.o. female who presents for  Chief Complaint  Patient presents with   Follow-up    2 months follow up    Has chest pain which is getting worse on minimal exertion  Chest Pain  This is a recurrent problem. The current episode started 1 to 4 weeks ago. The onset quality is gradual. The problem occurs 2 to 4 times per day. The problem has been waxing and waning. The quality of the pain is described as dull.      Past Medical History:  Diagnosis Date   Anxiety    Asthma    Bacterial conjunctivitis of right eye 03/16/2023   COVID 2021   Hospitalized with long Covid   Depression    Environmental allergies    Gestational diabetes    Pt is a type 2 diabetes   History of blood clots    when hospitalized for Salmonella poisoning and had Covid at the same time   Lumbar sprain 11/03/2018   Pericarditis 2021   while in hospital for Salmonella poisoning   Sesamoiditis of foot 11/05/2020   Sleep apnea    does not use a cpap     Past Surgical History:  Procedure Laterality Date   ABDOMINAL HYSTERECTOMY     OOPHORECTOMY     WISDOM TOOTH EXTRACTION       Current Outpatient Medications  Medication Sig Dispense Refill   ACCU-CHEK FASTCLIX LANCETS MISC   2   ADVAIR HFA 115-21 MCG/ACT inhaler SMARTSIG:2 Puff(s) By Mouth Twice Daily     ARIPiprazole (ABILIFY) 15 MG tablet Take 15 mg by mouth daily.     baclofen (LIORESAL) 10 MG tablet Take 10 mg by mouth 3 (three) times daily.     BD PEN NEEDLE NANO 2ND GEN 32G X 4 MM MISC USE THREE TIMES DAILY BEFORE MEALS 100 each 3   Blood Glucose Monitoring Suppl (ACCU-CHEK GUIDE) w/Device KIT USE DEVICE TO CHECK SUGARS DAILY     Butalbital-APAP-Caffeine 50-300-40 MG CAPS TAKE 1 CAPSULE BY MOUTH EVERY 6 HOURS AS NEEDED FOR  PERSISTENT HEADACHE 120 capsule 0   Continuous Glucose Sensor (DEXCOM G6 SENSOR) MISC USE WITH DEXCOM RECEIVER AND TRANSMITTER TO TRACK BLOOD SUGAR VALUES CHANGE SENOR EVERY 10 DAYS AS DIRECTED 9 each 3   Continuous Glucose Transmitter (DEXCOM G6 TRANSMITTER) MISC 1 Box by Does not apply route as needed. 2 each 2   dapagliflozin propanediol (FARXIGA) 10 MG TABS tablet TAKE 1 TABLET BY MOUTH DAILY FOR DIABETES 90 tablet 1   digoxin (LANOXIN) 0.25 MG tablet Take 1 tablet (250 mcg total) by mouth daily. 90 tablet 1   DULoxetine (CYMBALTA) 60 MG capsule Take 60 mg by mouth daily.  0   EPINEPHrine 0.3 mg/0.3 mL IJ SOAJ injection Inject 0.3 mg into the muscle as needed for anaphylaxis. 1 each 6   Evolocumab (REPATHA SURECLICK) 140 MG/ML SOAJ Inject 140 mg into the skin every 14 (fourteen) days. 2 mL 6   famotidine (PEPCID) 20 MG tablet TAKE 1 TABLET BY MOUTH EVERY DAY 90 tablet 1   fexofenadine-pseudoephedrine (ALLEGRA-D) 60-120 MG 12 hr tablet Take 1 tablet by mouth 2 (two) times daily. 30 tablet 11   fluticasone (FLONASE) 50 MCG/ACT nasal  spray Place 1 spray into both nostrils 2 (two) times daily. 16 g 0   furosemide (LASIX) 20 MG tablet Take 1 tablet (20 mg total) by mouth daily. (Patient taking differently: Take 40 mg by mouth daily.) 90 tablet 1   gabapentin (NEURONTIN) 300 MG capsule Take 1 capsule (300 mg total) by mouth 3 (three) times daily. 90 capsule 3   glucose blood (ACCU-CHEK GUIDE) test strip Accu-Chek Guide In Vitro Strip QTY: 200 strip Days: 90 Refills: 1  Written: 04/27/20 Patient Instructions: TEST FASTING BLOOD SUGAR EVERY MORNING FASTING AND EVERY EVENING E11.65     icosapent Ethyl (VASCEPA) 1 g capsule Take 2 capsules (2 g total) by mouth 2 (two) times daily. 120 capsule 1   Insulin Pen Needle (GLOBAL EASY GLIDE PEN NEEDLES) 32G X 4 MM MISC Inject 4 Needles into the skin in the morning, at noon, in the evening, and at bedtime. 100 each 5   ivabradine (CORLANOR) 7.5 MG TABS tablet Take  1 tablet (7.5 mg total) by mouth 2 (two) times daily. 180 tablet 1   Lancets Misc. (ACCU-CHEK FASTCLIX LANCET) KIT Accu-Chek Fastclix Lancet Drum  USE TO CHECK FASTING IN AM AND AS NEEDED FOR HIGH OR LOW     metoprolol succinate (TOPROL-XL) 100 MG 24 hr tablet Take 2 tablets (200 mg total) by mouth daily. 90 tablet 1   NOVOLOG FLEXPEN 100 UNIT/ML FlexPen Inject 10 Units into the skin 3 (three) times daily with meals. 15 mL 11   olmesartan (BENICAR) 5 MG tablet Take 1 tablet (5 mg total) by mouth daily. 90 tablet 1   OXYGEN Inhale 2 L into the lungs as needed (exertion).     pantoprazole (PROTONIX) 40 MG tablet Take 1 tablet (40 mg total) by mouth 2 (two) times daily. 180 tablet 3   potassium chloride (KLOR-CON M) 10 MEQ tablet TAKE 1 TABLET BY MOUTH EVERY DAILY WITH FUROSEMIDE TO PREVENT POTASSIUM DEPLETION 30 tablet 11   Rimegepant Sulfate (NURTEC) 75 MG TBDP Take 1 tablet (75 mg total) by mouth over 48 hr. (Patient not taking: Reported on 12/07/2023) 12 tablet 3   rosuvastatin (CRESTOR) 40 MG tablet Take 1 tablet (40 mg total) by mouth daily. 90 tablet 1   Semaglutide (RYBELSUS) 14 MG TABS Take 1 tablet (14 mg total) by mouth daily. 30 tablet 6   sucralfate (CARAFATE) 1 g tablet Take 1 tablet (1 g total) by mouth 4 (four) times daily. 120 tablet 1   SYMBICORT 160-4.5 MCG/ACT inhaler Inhale 2 puffs into the lungs in the morning and at bedtime.     topiramate (TOPAMAX) 25 MG tablet Take 50 mg by mouth at bedtime.     traZODone (DESYREL) 100 MG tablet Take 200 mg by mouth at bedtime.      TRESIBA FLEXTOUCH 200 UNIT/ML FlexTouch Pen ADMINISTER 48 UNITS UNDER THE SKIN DAILY 9 mL 3   No current facility-administered medications for this visit.    Allergies:   Glucophage [metformin]    Social History:   reports that she has never smoked. She has never used smokeless tobacco. She reports that she does not drink alcohol and does not use drugs.   Family History:  family history includes Breast  cancer in her cousin, maternal aunt, maternal grandmother, and paternal aunt; Breast cancer (age of onset: 53) in her sister.    ROS:     Review of Systems  Constitutional: Negative.   HENT: Negative.    Eyes: Negative.   Respiratory:  Negative.    Cardiovascular:  Positive for chest pain.  Gastrointestinal: Negative.   Genitourinary: Negative.   Musculoskeletal: Negative.   Skin: Negative.   Neurological: Negative.   Endo/Heme/Allergies: Negative.   Psychiatric/Behavioral: Negative.    All other systems reviewed and are negative.     All other systems are reviewed and negative.    PHYSICAL EXAM: VS:  BP 100/60   Pulse 96   Ht 5' 8.5" (1.74 m)   Wt 228 lb (103.4 kg)   SpO2 99%   BMI 34.16 kg/m  , BMI Body mass index is 34.16 kg/m. Last weight:  Wt Readings from Last 3 Encounters:  01/18/24 228 lb (103.4 kg)  12/07/23 230 lb 9.6 oz (104.6 kg)  11/17/23 231 lb 3.2 oz (104.9 kg)     Physical Exam Constitutional:      Appearance: Normal appearance.  Cardiovascular:     Rate and Rhythm: Normal rate and regular rhythm.     Heart sounds: Normal heart sounds.  Pulmonary:     Effort: Pulmonary effort is normal.     Breath sounds: Normal breath sounds.  Musculoskeletal:     Right lower leg: No edema.     Left lower leg: No edema.  Neurological:     Mental Status: She is alert.       EKG:   Recent Labs: 10/26/2023: TSH 0.813 01/15/2024: ALT 46; BUN 7; Creatinine, Ser 0.91; Potassium 4.5; Sodium 140    Lipid Panel    Component Value Date/Time   CHOL 334 (H) 10/26/2023 1354   TRIG 247 (H) 10/26/2023 1354   HDL 35 (L) 10/26/2023 1354   CHOLHDL 9.5 (H) 10/26/2023 1354   LDLCALC 247 (H) 10/26/2023 1354      Other studies Reviewed: Additional studies/ records that were reviewed today include:  Review of the above records demonstrates:       No data to display            ASSESSMENT AND PLAN:    ICD-10-CM   1. Other chest pain  R07.89 PCV  ECHOCARDIOGRAM COMPLETE    MYOCARDIAL PERFUSION IMAGING    Evolocumab (REPATHA SURECLICK) 140 MG/ML SOAJ   chest pain is getting worse, as cannot go to mail box and get chest pain, advise echo, stress test    2. Essential hypertension  I10 PCV ECHOCARDIOGRAM COMPLETE    MYOCARDIAL PERFUSION IMAGING    Evolocumab (REPATHA SURECLICK) 140 MG/ML SOAJ    3. Inappropriate sinus tachycardia (HCC)  I47.11 PCV ECHOCARDIOGRAM COMPLETE    MYOCARDIAL PERFUSION IMAGING    Evolocumab (REPATHA SURECLICK) 140 MG/ML SOAJ    4. OSA (obstructive sleep apnea)  G47.33 PCV ECHOCARDIOGRAM COMPLETE    MYOCARDIAL PERFUSION IMAGING    Evolocumab (REPATHA SURECLICK) 140 MG/ML SOAJ    5. Mixed hyperlipidemia  E78.2 PCV ECHOCARDIOGRAM COMPLETE    MYOCARDIAL PERFUSION IMAGING    Evolocumab (REPATHA SURECLICK) 140 MG/ML SOAJ    6. Screening mammogram, encounter for  Z12.31 PCV ECHOCARDIOGRAM COMPLETE    Evolocumab (REPATHA SURECLICK) 140 MG/ML SOAJ    7. Syncope, unspecified syncope type  R55 PCV ECHOCARDIOGRAM COMPLETE    Evolocumab (REPATHA SURECLICK) 140 MG/ML SOAJ       Problem List Items Addressed This Visit       Cardiovascular and Mediastinum   Inappropriate sinus tachycardia (HCC)   Relevant Medications   Evolocumab (REPATHA SURECLICK) 140 MG/ML SOAJ   Other Relevant Orders   PCV ECHOCARDIOGRAM COMPLETE   MYOCARDIAL PERFUSION IMAGING  Respiratory   OSA (obstructive sleep apnea)   Relevant Medications   Evolocumab (REPATHA SURECLICK) 140 MG/ML SOAJ   Other Relevant Orders   PCV ECHOCARDIOGRAM COMPLETE   MYOCARDIAL PERFUSION IMAGING     Other   Mixed hyperlipidemia   Relevant Medications   Evolocumab (REPATHA SURECLICK) 140 MG/ML SOAJ   Other Relevant Orders   PCV ECHOCARDIOGRAM COMPLETE   MYOCARDIAL PERFUSION IMAGING   Other Visit Diagnoses       Other chest pain    -  Primary   chest pain is getting worse, as cannot go to mail box and get chest pain, advise echo, stress test    Relevant Medications   Evolocumab (REPATHA SURECLICK) 140 MG/ML SOAJ   Other Relevant Orders   PCV ECHOCARDIOGRAM COMPLETE   MYOCARDIAL PERFUSION IMAGING     Essential hypertension       Relevant Medications   Evolocumab (REPATHA SURECLICK) 140 MG/ML SOAJ   Other Relevant Orders   PCV ECHOCARDIOGRAM COMPLETE   MYOCARDIAL PERFUSION IMAGING     Screening mammogram, encounter for       Relevant Medications   Evolocumab (REPATHA SURECLICK) 140 MG/ML SOAJ   Other Relevant Orders   PCV ECHOCARDIOGRAM COMPLETE     Syncope, unspecified syncope type       Relevant Medications   Evolocumab (REPATHA SURECLICK) 140 MG/ML SOAJ   Other Relevant Orders   PCV ECHOCARDIOGRAM COMPLETE          Disposition:   Return in about 2 weeks (around 02/01/2024) for echo, stress test and f/u.    Total time spent: 35 minutes  Signed,  Adrian Blackwater, MD  01/18/2024 10:13 AM    [facility]

## 2024-01-27 ENCOUNTER — Ambulatory Visit (INDEPENDENT_AMBULATORY_CARE_PROVIDER_SITE_OTHER): Payer: Medicaid Other

## 2024-01-27 DIAGNOSIS — I361 Nonrheumatic tricuspid (valve) insufficiency: Secondary | ICD-10-CM

## 2024-01-27 DIAGNOSIS — E782 Mixed hyperlipidemia: Secondary | ICD-10-CM

## 2024-01-27 DIAGNOSIS — I340 Nonrheumatic mitral (valve) insufficiency: Secondary | ICD-10-CM

## 2024-01-27 DIAGNOSIS — Z1231 Encounter for screening mammogram for malignant neoplasm of breast: Secondary | ICD-10-CM

## 2024-01-27 DIAGNOSIS — R0789 Other chest pain: Secondary | ICD-10-CM

## 2024-01-27 DIAGNOSIS — G4733 Obstructive sleep apnea (adult) (pediatric): Secondary | ICD-10-CM

## 2024-01-27 DIAGNOSIS — I10 Essential (primary) hypertension: Secondary | ICD-10-CM

## 2024-01-27 DIAGNOSIS — I4711 Inappropriate sinus tachycardia, so stated: Secondary | ICD-10-CM

## 2024-01-27 DIAGNOSIS — R55 Syncope and collapse: Secondary | ICD-10-CM

## 2024-01-28 ENCOUNTER — Ambulatory Visit: Payer: Medicaid Other | Admitting: Cardiology

## 2024-01-28 ENCOUNTER — Ambulatory Visit (INDEPENDENT_AMBULATORY_CARE_PROVIDER_SITE_OTHER): Payer: Medicaid Other

## 2024-01-28 DIAGNOSIS — R0789 Other chest pain: Secondary | ICD-10-CM

## 2024-01-28 DIAGNOSIS — E782 Mixed hyperlipidemia: Secondary | ICD-10-CM

## 2024-01-28 DIAGNOSIS — G4733 Obstructive sleep apnea (adult) (pediatric): Secondary | ICD-10-CM

## 2024-01-28 DIAGNOSIS — I4711 Inappropriate sinus tachycardia, so stated: Secondary | ICD-10-CM

## 2024-01-28 DIAGNOSIS — I10 Essential (primary) hypertension: Secondary | ICD-10-CM

## 2024-01-28 MED ORDER — TECHNETIUM TC 99M SESTAMIBI GENERIC - CARDIOLITE
33.2000 | Freq: Once | INTRAVENOUS | Status: AC | PRN
  Administered 2024-01-28: 33.2 via INTRAVENOUS

## 2024-01-28 MED ORDER — TECHNETIUM TC 99M SESTAMIBI GENERIC - CARDIOLITE
10.1000 | Freq: Once | INTRAVENOUS | Status: AC | PRN
  Administered 2024-01-28: 10.1 via INTRAVENOUS

## 2024-01-28 MED ORDER — TECHNETIUM TC 99M SESTAMIBI GENERIC - CARDIOLITE: 10.1000 | Freq: Once | INTRAVENOUS | Status: DC | PRN

## 2024-01-28 MED ORDER — TECHNETIUM TC 99M SESTAMIBI GENERIC - CARDIOLITE: 33.2000 | Freq: Once | INTRAVENOUS | Status: DC | PRN

## 2024-02-02 ENCOUNTER — Ambulatory Visit: Payer: Medicaid Other | Admitting: Cardiovascular Disease

## 2024-02-02 ENCOUNTER — Encounter: Payer: Self-pay | Admitting: Cardiovascular Disease

## 2024-02-02 VITALS — BP 138/84 | HR 86 | Ht 69.0 in | Wt 228.0 lb

## 2024-02-02 DIAGNOSIS — I4711 Inappropriate sinus tachycardia, so stated: Secondary | ICD-10-CM

## 2024-02-02 DIAGNOSIS — G4733 Obstructive sleep apnea (adult) (pediatric): Secondary | ICD-10-CM | POA: Diagnosis not present

## 2024-02-02 DIAGNOSIS — Z1231 Encounter for screening mammogram for malignant neoplasm of breast: Secondary | ICD-10-CM | POA: Diagnosis not present

## 2024-02-02 DIAGNOSIS — R55 Syncope and collapse: Secondary | ICD-10-CM

## 2024-02-02 DIAGNOSIS — I10 Essential (primary) hypertension: Secondary | ICD-10-CM | POA: Diagnosis not present

## 2024-02-02 DIAGNOSIS — I5189 Other ill-defined heart diseases: Secondary | ICD-10-CM

## 2024-02-02 DIAGNOSIS — J9611 Chronic respiratory failure with hypoxia: Secondary | ICD-10-CM | POA: Diagnosis not present

## 2024-02-02 DIAGNOSIS — I2694 Multiple subsegmental pulmonary emboli without acute cor pulmonale: Secondary | ICD-10-CM | POA: Diagnosis not present

## 2024-02-02 DIAGNOSIS — I259 Chronic ischemic heart disease, unspecified: Secondary | ICD-10-CM

## 2024-02-02 DIAGNOSIS — R0789 Other chest pain: Secondary | ICD-10-CM | POA: Diagnosis not present

## 2024-02-02 DIAGNOSIS — E782 Mixed hyperlipidemia: Secondary | ICD-10-CM

## 2024-02-02 MED ORDER — REPATHA SURECLICK 140 MG/ML ~~LOC~~ SOAJ: 140.0000 mg | SUBCUTANEOUS | 6 refills | Status: DC

## 2024-02-02 MED ORDER — DIGOXIN 250 MCG PO TABS: 250.0000 ug | ORAL_TABLET | Freq: Every day | ORAL | 1 refills | Status: DC

## 2024-02-02 MED ORDER — LOSARTAN POTASSIUM 25 MG PO TABS: 25.0000 mg | ORAL_TABLET | Freq: Every day | ORAL | 1 refills | Status: DC

## 2024-02-02 MED ORDER — IVABRADINE HCL 7.5 MG PO TABS: 7.5000 mg | ORAL_TABLET | Freq: Two times a day (BID) | ORAL | 1 refills | Status: DC

## 2024-02-02 MED ORDER — ROSUVASTATIN CALCIUM 40 MG PO TABS: 40.0000 mg | ORAL_TABLET | Freq: Every day | ORAL | 1 refills | Status: DC

## 2024-02-02 MED ORDER — METOPROLOL SUCCINATE ER 100 MG PO TB24: 200.0000 mg | ORAL_TABLET | Freq: Every day | ORAL | 1 refills | Status: AC

## 2024-02-02 MED ORDER — ICOSAPENT ETHYL 1 G PO CAPS: 2.0000 g | ORAL_CAPSULE | Freq: Two times a day (BID) | ORAL | 1 refills | Status: DC

## 2024-02-02 MED ORDER — FUROSEMIDE 20 MG PO TABS: 20.0000 mg | ORAL_TABLET | Freq: Every day | ORAL | 1 refills | Status: DC

## 2024-02-02 NOTE — Addendum Note (Signed)
Addended by: Adrian Blackwater A on: 02/02/2024 10:38 AM   Modules accepted: Orders

## 2024-02-02 NOTE — Progress Notes (Addendum)
 Cardiology Office Note   Date:  02/02/2024   ID:  Crystal Gibson, DOB 07-10-1971, MRN [id number]  PCP:  Carin Gauze, NP  Cardiologist:  Denyse Bathe, MD      History of Present Illness: Crystal Gibson is a 53 y.o. female who presents for No chief complaint on file.   Have headaches      Past Medical History:  Diagnosis Date   Anxiety    Asthma    Bacterial conjunctivitis of right eye 03/16/2023   COVID 2021   Hospitalized with long Covid   Depression    Environmental allergies    Gestational diabetes    Pt is a type 2 diabetes   History of blood clots    when hospitalized for Salmonella poisoning and had Covid at the same time   Lumbar sprain 11/03/2018   Pericarditis 2021   while in hospital for Salmonella poisoning   Sesamoiditis of foot 11/05/2020   Sleep apnea    does not use a cpap     Past Surgical History:  Procedure Laterality Date   ABDOMINAL HYSTERECTOMY     OOPHORECTOMY     WISDOM TOOTH EXTRACTION       Current Outpatient Medications  Medication Sig Dispense Refill   ACCU-CHEK FASTCLIX LANCETS MISC   2   ADVAIR  HFA 115-21 MCG/ACT inhaler SMARTSIG:2 Puff(s) By Mouth Twice Daily     ARIPiprazole  (ABILIFY ) 15 MG tablet Take 15 mg by mouth daily.     baclofen (LIORESAL) 10 MG tablet Take 10 mg by mouth 3 (three) times daily.     BD PEN NEEDLE NANO 2ND GEN 32G X 4 MM MISC USE THREE TIMES DAILY BEFORE MEALS 100 each 3   Blood Glucose Monitoring Suppl (ACCU-CHEK GUIDE) w/Device KIT USE DEVICE TO CHECK SUGARS DAILY     Butalbital -APAP-Caffeine  50-300-40 MG CAPS TAKE 1 CAPSULE BY MOUTH EVERY 6 HOURS AS NEEDED FOR PERSISTENT HEADACHE 120 capsule 0   Continuous Glucose Sensor (DEXCOM G6 SENSOR) MISC USE WITH DEXCOM RECEIVER AND TRANSMITTER TO TRACK BLOOD SUGAR VALUES CHANGE SENOR EVERY 10 DAYS AS DIRECTED 9 each 3   Continuous Glucose Transmitter (DEXCOM G6 TRANSMITTER) MISC 1 Box by Does not apply route as needed. 2 each 2   dapagliflozin   propanediol (FARXIGA ) 10 MG TABS tablet TAKE 1 TABLET BY MOUTH DAILY FOR DIABETES 90 tablet 1   DULoxetine  (CYMBALTA ) 60 MG capsule Take 60 mg by mouth daily.  0   EPINEPHrine  0.3 mg/0.3 mL IJ SOAJ injection Inject 0.3 mg into the muscle as needed for anaphylaxis. 1 each 6   famotidine  (PEPCID ) 20 MG tablet TAKE 1 TABLET BY MOUTH EVERY DAY 90 tablet 1   fexofenadine -pseudoephedrine (ALLEGRA-D) 60-120 MG 12 hr tablet Take 1 tablet by mouth 2 (two) times daily. 30 tablet 11   fluticasone  (FLONASE ) 50 MCG/ACT nasal spray Place 1 spray into both nostrils 2 (two) times daily. 16 g 0   gabapentin  (NEURONTIN ) 300 MG capsule Take 1 capsule (300 mg total) by mouth 3 (three) times daily. 90 capsule 3   glucose blood (ACCU-CHEK GUIDE) test strip Accu-Chek Guide In Vitro Strip QTY: 200 strip Days: 90 Refills: 1  Written: 04/27/20 Patient Instructions: TEST FASTING BLOOD SUGAR EVERY MORNING FASTING AND EVERY EVENING E11.65     Insulin  Pen Needle (GLOBAL EASY GLIDE PEN NEEDLES) 32G X 4 MM MISC Inject 4 Needles into the skin in the morning, at noon, in the evening, and at bedtime. 100 each 5  Lancets Misc. (ACCU-CHEK FASTCLIX LANCET) KIT Accu-Chek Fastclix Lancet Drum  USE TO CHECK FASTING IN AM AND AS NEEDED FOR HIGH OR LOW     losartan  (COZAAR ) 25 MG tablet Take 1 tablet (25 mg total) by mouth daily. 90 tablet 1   NOVOLOG  FLEXPEN 100 UNIT/ML FlexPen Inject 10 Units into the skin 3 (three) times daily with meals. 15 mL 11   OXYGEN Inhale 2 L into the lungs as needed (exertion).     pantoprazole  (PROTONIX ) 40 MG tablet Take 1 tablet (40 mg total) by mouth 2 (two) times daily. 180 tablet 3   potassium chloride  (KLOR-CON  M) 10 MEQ tablet TAKE 1 TABLET BY MOUTH EVERY DAILY WITH FUROSEMIDE  TO PREVENT POTASSIUM DEPLETION 30 tablet 11   Semaglutide  (RYBELSUS ) 14 MG TABS Take 1 tablet (14 mg total) by mouth daily. 30 tablet 6   sucralfate  (CARAFATE ) 1 g tablet Take 1 tablet (1 g total) by mouth 4 (four) times daily. 120  tablet 1   SYMBICORT  160-4.5 MCG/ACT inhaler Inhale 2 puffs into the lungs in the morning and at bedtime.     topiramate  (TOPAMAX ) 25 MG tablet Take 50 mg by mouth at bedtime.     traZODone  (DESYREL ) 100 MG tablet Take 200 mg by mouth at bedtime.      TRESIBA  FLEXTOUCH 200 UNIT/ML FlexTouch Pen ADMINISTER 48 UNITS UNDER THE SKIN DAILY 9 mL 3   digoxin  (LANOXIN ) 0.25 MG tablet Take 1 tablet (250 mcg total) by mouth daily. 90 tablet 1   Evolocumab  (REPATHA  SURECLICK) 140 MG/ML SOAJ Inject 140 mg into the skin every 14 (fourteen) days. 2 mL 6   furosemide  (LASIX ) 20 MG tablet Take 1 tablet (20 mg total) by mouth daily. 90 tablet 1   icosapent  Ethyl (VASCEPA ) 1 g capsule Take 2 capsules (2 g total) by mouth 2 (two) times daily. 120 capsule 1   ivabradine  (CORLANOR) 7.5 MG TABS tablet Take 1 tablet (7.5 mg total) by mouth 2 (two) times daily. 180 tablet 1   metoprolol  succinate (TOPROL -XL) 100 MG 24 hr tablet Take 2 tablets (200 mg total) by mouth daily. 90 tablet 1   Rimegepant Sulfate (NURTEC) 75 MG TBDP Take 1 tablet (75 mg total) by mouth over 48 hr. (Patient not taking: Reported on 02/02/2024) 12 tablet 3   rosuvastatin  (CRESTOR ) 40 MG tablet Take 1 tablet (40 mg total) by mouth daily. 90 tablet 1   No current facility-administered medications for this visit.    Allergies:   Glucophage [metformin]    Social History:   reports that she has never smoked. She has never used smokeless tobacco. She reports that she does not drink alcohol and does not use drugs.   Family History:  family history includes Breast cancer in her cousin, maternal aunt, maternal grandmother, and paternal aunt; Breast cancer (age of onset: 40) in her sister.    ROS:     Review of Systems  Constitutional: Negative.   HENT: Negative.    Eyes: Negative.   Respiratory: Negative.    Gastrointestinal: Negative.   Genitourinary: Negative.   Musculoskeletal: Negative.   Skin: Negative.   Neurological: Negative.    Endo/Heme/Allergies: Negative.   Psychiatric/Behavioral: Negative.    All other systems reviewed and are negative.     All other systems are reviewed and negative.    PHYSICAL EXAM: VS:  BP 138/84   Pulse 86   Ht 5' 9 (1.753 m)   Wt 228 lb (103.4 kg)  SpO2 97%   BMI 33.67 kg/m  , BMI Body mass index is 33.67 kg/m. Last weight:  Wt Readings from Last 3 Encounters:  02/02/24 228 lb (103.4 kg)  01/18/24 228 lb (103.4 kg)  12/07/23 230 lb 9.6 oz (104.6 kg)     Physical Exam Constitutional:      Appearance: Normal appearance.  Cardiovascular:     Rate and Rhythm: Normal rate and regular rhythm.     Heart sounds: Normal heart sounds.  Pulmonary:     Effort: Pulmonary effort is normal.     Breath sounds: Normal breath sounds.  Musculoskeletal:     Right lower leg: No edema.     Left lower leg: No edema.  Neurological:     Mental Status: She is alert.       EKG:   Recent Labs: 10/26/2023: TSH 0.813 01/15/2024: ALT 46; BUN 7; Creatinine, Ser 0.91; Potassium 4.5; Sodium 140    Lipid Panel    Component Value Date/Time   CHOL 334 (H) 10/26/2023 1354   TRIG 247 (H) 10/26/2023 1354   HDL 35 (L) 10/26/2023 1354   CHOLHDL 9.5 (H) 10/26/2023 1354   LDLCALC 247 (H) 10/26/2023 1354      Other studies Reviewed: Additional studies/ records that were reviewed today include:  Review of the above records demonstrates:       No data to display            ASSESSMENT AND PLAN:    ICD-10-CM   1. Multiple subsegmental pulmonary emboli without acute cor pulmonale (HCC)  I26.94 furosemide  (LASIX ) 20 MG tablet    Evolocumab  (REPATHA  SURECLICK) 140 MG/ML SOAJ    metoprolol  succinate (TOPROL -XL) 100 MG 24 hr tablet    rosuvastatin  (CRESTOR ) 40 MG tablet    icosapent  Ethyl (VASCEPA ) 1 g capsule    digoxin  (LANOXIN ) 0.25 MG tablet    losartan  (COZAAR ) 25 MG tablet    CT CORONARY MORPH W/CTA COR W/SCORE W/CA W/CM &/OR WO/CM    Basic metabolic panel    2.  Inappropriate sinus tachycardia (HCC)  I47.11 furosemide  (LASIX ) 20 MG tablet    Evolocumab  (REPATHA  SURECLICK) 140 MG/ML SOAJ    metoprolol  succinate (TOPROL -XL) 100 MG 24 hr tablet    rosuvastatin  (CRESTOR ) 40 MG tablet    ivabradine  (CORLANOR) 7.5 MG TABS tablet    icosapent  Ethyl (VASCEPA ) 1 g capsule    digoxin  (LANOXIN ) 0.25 MG tablet    losartan  (COZAAR ) 25 MG tablet    CT CORONARY MORPH W/CTA COR W/SCORE W/CA W/CM &/OR WO/CM    Basic metabolic panel   palpitation less frequent    3. Chronic respiratory failure with hypoxia (HCC)  J96.11 furosemide  (LASIX ) 20 MG tablet    Evolocumab  (REPATHA  SURECLICK) 140 MG/ML SOAJ    metoprolol  succinate (TOPROL -XL) 100 MG 24 hr tablet    rosuvastatin  (CRESTOR ) 40 MG tablet    icosapent  Ethyl (VASCEPA ) 1 g capsule    digoxin  (LANOXIN ) 0.25 MG tablet    losartan  (COZAAR ) 25 MG tablet    CT CORONARY MORPH W/CTA COR W/SCORE W/CA W/CM &/OR WO/CM    Basic metabolic panel    4. OSA (obstructive sleep apnea)  G47.33 furosemide  (LASIX ) 20 MG tablet    Evolocumab  (REPATHA  SURECLICK) 140 MG/ML SOAJ    metoprolol  succinate (TOPROL -XL) 100 MG 24 hr tablet    rosuvastatin  (CRESTOR ) 40 MG tablet    icosapent  Ethyl (VASCEPA ) 1 g capsule    digoxin  (LANOXIN ) 0.25 MG tablet  losartan  (COZAAR ) 25 MG tablet    CT CORONARY MORPH W/CTA COR W/SCORE W/CA W/CM &/OR WO/CM    Basic metabolic panel    5. Mixed hyperlipidemia  E78.2 furosemide  (LASIX ) 20 MG tablet    Evolocumab  (REPATHA  SURECLICK) 140 MG/ML SOAJ    metoprolol  succinate (TOPROL -XL) 100 MG 24 hr tablet    rosuvastatin  (CRESTOR ) 40 MG tablet    icosapent  Ethyl (VASCEPA ) 1 g capsule    digoxin  (LANOXIN ) 0.25 MG tablet    losartan  (COZAAR ) 25 MG tablet    CT CORONARY MORPH W/CTA COR W/SCORE W/CA W/CM &/OR WO/CM    Basic metabolic panel    6. Diastolic dysfunction  I51.89 furosemide  (LASIX ) 20 MG tablet    Evolocumab  (REPATHA  SURECLICK) 140 MG/ML SOAJ    metoprolol  succinate (TOPROL -XL) 100 MG  24 hr tablet    rosuvastatin  (CRESTOR ) 40 MG tablet    icosapent  Ethyl (VASCEPA ) 1 g capsule    digoxin  (LANOXIN ) 0.25 MG tablet    losartan  (COZAAR ) 25 MG tablet    CT CORONARY MORPH W/CTA COR W/SCORE W/CA W/CM &/OR WO/CM    Basic metabolic panel    7. Essential hypertension  I10 Evolocumab  (REPATHA  SURECLICK) 140 MG/ML SOAJ    metoprolol  succinate (TOPROL -XL) 100 MG 24 hr tablet    rosuvastatin  (CRESTOR ) 40 MG tablet    icosapent  Ethyl (VASCEPA ) 1 g capsule    digoxin  (LANOXIN ) 0.25 MG tablet    losartan  (COZAAR ) 25 MG tablet    CT CORONARY MORPH W/CTA COR W/SCORE W/CA W/CM &/OR WO/CM    Basic metabolic panel   change to losartan  25    8. Other chest pain  R07.89 Evolocumab  (REPATHA  SURECLICK) 140 MG/ML SOAJ    metoprolol  succinate (TOPROL -XL) 100 MG 24 hr tablet    rosuvastatin  (CRESTOR ) 40 MG tablet    icosapent  Ethyl (VASCEPA ) 1 g capsule    digoxin  (LANOXIN ) 0.25 MG tablet    losartan  (COZAAR ) 25 MG tablet    CT CORONARY MORPH W/CTA COR W/SCORE W/CA W/CM &/OR WO/CM    Basic metabolic panel   chest pain is getting worse, as cannot go to mail box and get chest pain, advise echo, stress test    9. Screening mammogram, encounter for  Z12.31 Evolocumab  (REPATHA  SURECLICK) 140 MG/ML SOAJ    metoprolol  succinate (TOPROL -XL) 100 MG 24 hr tablet    rosuvastatin  (CRESTOR ) 40 MG tablet    icosapent  Ethyl (VASCEPA ) 1 g capsule    digoxin  (LANOXIN ) 0.25 MG tablet    losartan  (COZAAR ) 25 MG tablet    CT CORONARY MORPH W/CTA COR W/SCORE W/CA W/CM &/OR WO/CM    Basic metabolic panel    10. Syncope, unspecified syncope type  R55 Evolocumab  (REPATHA  SURECLICK) 140 MG/ML SOAJ    metoprolol  succinate (TOPROL -XL) 100 MG 24 hr tablet    rosuvastatin  (CRESTOR ) 40 MG tablet    icosapent  Ethyl (VASCEPA ) 1 g capsule    digoxin  (LANOXIN ) 0.25 MG tablet    losartan  (COZAAR ) 25 MG tablet    CT CORONARY MORPH W/CTA COR W/SCORE W/CA W/CM &/OR WO/CM    Basic metabolic panel    11. Chest pain due  to myocardial ischemia, unspecified ischemic chest pain type  I25.9 CT CORONARY MORPH W/CTA COR W/SCORE W/CA W/CM &/OR WO/CM    Basic metabolic panel   stress test showed anterior wall reversible defect advise  CCTA       Problem List Items Addressed This Visit       Cardiovascular  and Mediastinum   Multiple subsegmental pulmonary emboli without acute cor pulmonale (HCC) - Primary   Relevant Medications   furosemide  (LASIX ) 20 MG tablet   Evolocumab  (REPATHA  SURECLICK) 140 MG/ML SOAJ   metoprolol  succinate (TOPROL -XL) 100 MG 24 hr tablet   rosuvastatin  (CRESTOR ) 40 MG tablet   ivabradine  (CORLANOR) 7.5 MG TABS tablet   icosapent  Ethyl (VASCEPA ) 1 g capsule   digoxin  (LANOXIN ) 0.25 MG tablet   losartan  (COZAAR ) 25 MG tablet   Other Relevant Orders   CT CORONARY MORPH W/CTA COR W/SCORE W/CA W/CM &/OR WO/CM   Basic metabolic panel   Inappropriate sinus tachycardia (HCC)   Relevant Medications   furosemide  (LASIX ) 20 MG tablet   Evolocumab  (REPATHA  SURECLICK) 140 MG/ML SOAJ   metoprolol  succinate (TOPROL -XL) 100 MG 24 hr tablet   rosuvastatin  (CRESTOR ) 40 MG tablet   ivabradine  (CORLANOR) 7.5 MG TABS tablet   icosapent  Ethyl (VASCEPA ) 1 g capsule   digoxin  (LANOXIN ) 0.25 MG tablet   losartan  (COZAAR ) 25 MG tablet   Other Relevant Orders   CT CORONARY MORPH W/CTA COR W/SCORE W/CA W/CM &/OR WO/CM   Basic metabolic panel     Respiratory   Chronic respiratory failure with hypoxia (HCC)   Relevant Medications   furosemide  (LASIX ) 20 MG tablet   Evolocumab  (REPATHA  SURECLICK) 140 MG/ML SOAJ   metoprolol  succinate (TOPROL -XL) 100 MG 24 hr tablet   rosuvastatin  (CRESTOR ) 40 MG tablet   icosapent  Ethyl (VASCEPA ) 1 g capsule   digoxin  (LANOXIN ) 0.25 MG tablet   losartan  (COZAAR ) 25 MG tablet   Other Relevant Orders   CT CORONARY MORPH W/CTA COR W/SCORE W/CA W/CM &/OR WO/CM   Basic metabolic panel   OSA (obstructive sleep apnea)   Relevant Medications   furosemide  (LASIX ) 20 MG  tablet   Evolocumab  (REPATHA  SURECLICK) 140 MG/ML SOAJ   metoprolol  succinate (TOPROL -XL) 100 MG 24 hr tablet   rosuvastatin  (CRESTOR ) 40 MG tablet   icosapent  Ethyl (VASCEPA ) 1 g capsule   digoxin  (LANOXIN ) 0.25 MG tablet   losartan  (COZAAR ) 25 MG tablet   Other Relevant Orders   CT CORONARY MORPH W/CTA COR W/SCORE W/CA W/CM &/OR WO/CM   Basic metabolic panel     Other   Mixed hyperlipidemia   Relevant Medications   furosemide  (LASIX ) 20 MG tablet   Evolocumab  (REPATHA  SURECLICK) 140 MG/ML SOAJ   metoprolol  succinate (TOPROL -XL) 100 MG 24 hr tablet   rosuvastatin  (CRESTOR ) 40 MG tablet   ivabradine  (CORLANOR) 7.5 MG TABS tablet   icosapent  Ethyl (VASCEPA ) 1 g capsule   digoxin  (LANOXIN ) 0.25 MG tablet   losartan  (COZAAR ) 25 MG tablet   Other Relevant Orders   CT CORONARY MORPH W/CTA COR W/SCORE W/CA W/CM &/OR WO/CM   Basic metabolic panel   Diastolic dysfunction   Relevant Medications   furosemide  (LASIX ) 20 MG tablet   Evolocumab  (REPATHA  SURECLICK) 140 MG/ML SOAJ   metoprolol  succinate (TOPROL -XL) 100 MG 24 hr tablet   rosuvastatin  (CRESTOR ) 40 MG tablet   icosapent  Ethyl (VASCEPA ) 1 g capsule   digoxin  (LANOXIN ) 0.25 MG tablet   losartan  (COZAAR ) 25 MG tablet   Other Relevant Orders   CT CORONARY MORPH W/CTA COR W/SCORE W/CA W/CM &/OR WO/CM   Basic metabolic panel   Other Visit Diagnoses       Essential hypertension       change to losartan  25   Relevant Medications   furosemide  (LASIX ) 20 MG tablet   Evolocumab  (REPATHA  SURECLICK) 140 MG/ML SOAJ  metoprolol  succinate (TOPROL -XL) 100 MG 24 hr tablet   rosuvastatin  (CRESTOR ) 40 MG tablet   ivabradine  (CORLANOR) 7.5 MG TABS tablet   icosapent  Ethyl (VASCEPA ) 1 g capsule   digoxin  (LANOXIN ) 0.25 MG tablet   losartan  (COZAAR ) 25 MG tablet   Other Relevant Orders   CT CORONARY MORPH W/CTA COR W/SCORE W/CA W/CM &/OR WO/CM   Basic metabolic panel     Other chest pain       chest pain is getting worse, as cannot go  to mail box and get chest pain, advise echo, stress test   Relevant Medications   Evolocumab  (REPATHA  SURECLICK) 140 MG/ML SOAJ   metoprolol  succinate (TOPROL -XL) 100 MG 24 hr tablet   rosuvastatin  (CRESTOR ) 40 MG tablet   icosapent  Ethyl (VASCEPA ) 1 g capsule   digoxin  (LANOXIN ) 0.25 MG tablet   losartan  (COZAAR ) 25 MG tablet   Other Relevant Orders   CT CORONARY MORPH W/CTA COR W/SCORE W/CA W/CM &/OR WO/CM   Basic metabolic panel     Screening mammogram, encounter for       Relevant Medications   Evolocumab  (REPATHA  SURECLICK) 140 MG/ML SOAJ   metoprolol  succinate (TOPROL -XL) 100 MG 24 hr tablet   rosuvastatin  (CRESTOR ) 40 MG tablet   icosapent  Ethyl (VASCEPA ) 1 g capsule   digoxin  (LANOXIN ) 0.25 MG tablet   losartan  (COZAAR ) 25 MG tablet   Other Relevant Orders   CT CORONARY MORPH W/CTA COR W/SCORE W/CA W/CM &/OR WO/CM   Basic metabolic panel     Syncope, unspecified syncope type       Relevant Medications   Evolocumab  (REPATHA  SURECLICK) 140 MG/ML SOAJ   metoprolol  succinate (TOPROL -XL) 100 MG 24 hr tablet   rosuvastatin  (CRESTOR ) 40 MG tablet   icosapent  Ethyl (VASCEPA ) 1 g capsule   digoxin  (LANOXIN ) 0.25 MG tablet   losartan  (COZAAR ) 25 MG tablet   Other Relevant Orders   CT CORONARY MORPH W/CTA COR W/SCORE W/CA W/CM &/OR WO/CM   Basic metabolic panel     Chest pain due to myocardial ischemia, unspecified ischemic chest pain type       stress test showed anterior wall reversible defect advise  CCTA   Relevant Orders   CT CORONARY MORPH W/CTA COR W/SCORE W/CA W/CM &/OR WO/CM   Basic metabolic panel          Disposition:   Return in about 3 weeks (around 02/23/2024) for ccta and f/u.    Total time spent: 30 minutes  Signed,  Denyse Bathe, MD  02/02/2024 10:38 AM    [facility]

## 2024-02-03 ENCOUNTER — Other Ambulatory Visit: Payer: Medicaid Other

## 2024-02-03 DIAGNOSIS — J9611 Chronic respiratory failure with hypoxia: Secondary | ICD-10-CM | POA: Diagnosis not present

## 2024-02-03 DIAGNOSIS — Z1231 Encounter for screening mammogram for malignant neoplasm of breast: Secondary | ICD-10-CM | POA: Diagnosis not present

## 2024-02-03 DIAGNOSIS — I259 Chronic ischemic heart disease, unspecified: Secondary | ICD-10-CM | POA: Diagnosis not present

## 2024-02-03 DIAGNOSIS — I10 Essential (primary) hypertension: Secondary | ICD-10-CM | POA: Diagnosis not present

## 2024-02-03 DIAGNOSIS — R0789 Other chest pain: Secondary | ICD-10-CM | POA: Diagnosis not present

## 2024-02-03 DIAGNOSIS — I2694 Multiple subsegmental pulmonary emboli without acute cor pulmonale: Secondary | ICD-10-CM | POA: Diagnosis not present

## 2024-02-03 DIAGNOSIS — R55 Syncope and collapse: Secondary | ICD-10-CM | POA: Diagnosis not present

## 2024-02-03 DIAGNOSIS — I5189 Other ill-defined heart diseases: Secondary | ICD-10-CM | POA: Diagnosis not present

## 2024-02-03 DIAGNOSIS — I4711 Inappropriate sinus tachycardia, so stated: Secondary | ICD-10-CM | POA: Diagnosis not present

## 2024-02-03 DIAGNOSIS — E782 Mixed hyperlipidemia: Secondary | ICD-10-CM | POA: Diagnosis not present

## 2024-02-03 DIAGNOSIS — G4733 Obstructive sleep apnea (adult) (pediatric): Secondary | ICD-10-CM | POA: Diagnosis not present

## 2024-02-04 ENCOUNTER — Other Ambulatory Visit: Payer: Self-pay

## 2024-02-04 DIAGNOSIS — E1165 Type 2 diabetes mellitus with hyperglycemia: Secondary | ICD-10-CM

## 2024-02-04 DIAGNOSIS — Z794 Long term (current) use of insulin: Secondary | ICD-10-CM

## 2024-02-04 LAB — BASIC METABOLIC PANEL
BUN/Creatinine Ratio: 16 (ref 9–23)
BUN: 12 mg/dL (ref 6–24)
CO2: 23 mmol/L (ref 20–29)
Calcium: 9.7 mg/dL (ref 8.7–10.2)
Chloride: 101 mmol/L (ref 96–106)
Creatinine, Ser: 0.76 mg/dL (ref 0.57–1.00)
Glucose: 259 mg/dL — ABNORMAL HIGH (ref 70–99)
Potassium: 4.3 mmol/L (ref 3.5–5.2)
Sodium: 142 mmol/L (ref 134–144)
eGFR: 94 mL/min/{1.73_m2} (ref >59)

## 2024-02-04 MED ORDER — RYBELSUS 14 MG PO TABS: 1.0000 | ORAL_TABLET | Freq: Every day | ORAL | 6 refills | Status: DC

## 2024-02-05 ENCOUNTER — Ambulatory Visit (INDEPENDENT_AMBULATORY_CARE_PROVIDER_SITE_OTHER): Payer: Medicaid Other | Admitting: Cardiology

## 2024-02-05 ENCOUNTER — Encounter: Payer: Self-pay | Admitting: Cardiology

## 2024-02-05 VITALS — BP 148/82 | HR 94 | Ht 69.0 in | Wt 229.0 lb

## 2024-02-05 DIAGNOSIS — Z1329 Encounter for screening for other suspected endocrine disorder: Secondary | ICD-10-CM

## 2024-02-05 DIAGNOSIS — Z794 Long term (current) use of insulin: Secondary | ICD-10-CM

## 2024-02-05 DIAGNOSIS — J9611 Chronic respiratory failure with hypoxia: Secondary | ICD-10-CM | POA: Diagnosis not present

## 2024-02-05 DIAGNOSIS — I10 Essential (primary) hypertension: Secondary | ICD-10-CM | POA: Diagnosis not present

## 2024-02-05 DIAGNOSIS — R55 Syncope and collapse: Secondary | ICD-10-CM

## 2024-02-05 DIAGNOSIS — I4711 Inappropriate sinus tachycardia, so stated: Secondary | ICD-10-CM | POA: Diagnosis not present

## 2024-02-05 DIAGNOSIS — E782 Mixed hyperlipidemia: Secondary | ICD-10-CM

## 2024-02-05 DIAGNOSIS — I5189 Other ill-defined heart diseases: Secondary | ICD-10-CM

## 2024-02-05 DIAGNOSIS — G4733 Obstructive sleep apnea (adult) (pediatric): Secondary | ICD-10-CM

## 2024-02-05 DIAGNOSIS — E1165 Type 2 diabetes mellitus with hyperglycemia: Secondary | ICD-10-CM

## 2024-02-05 DIAGNOSIS — R0789 Other chest pain: Secondary | ICD-10-CM | POA: Diagnosis not present

## 2024-02-05 DIAGNOSIS — Z1231 Encounter for screening mammogram for malignant neoplasm of breast: Secondary | ICD-10-CM

## 2024-02-05 DIAGNOSIS — I2694 Multiple subsegmental pulmonary emboli without acute cor pulmonale: Secondary | ICD-10-CM | POA: Diagnosis not present

## 2024-02-05 MED ORDER — DULOXETINE HCL 60 MG PO CPEP: 60.0000 mg | ORAL_CAPSULE | Freq: Every day | ORAL | 3 refills | Status: DC

## 2024-02-05 MED ORDER — MOUNJARO 2.5 MG/0.5ML ~~LOC~~ SOAJ: 2.5000 mg | SUBCUTANEOUS | 3 refills | Status: DC

## 2024-02-05 MED ORDER — GABAPENTIN 300 MG PO CAPS: 300.0000 mg | ORAL_CAPSULE | Freq: Three times a day (TID) | ORAL | 3 refills | Status: AC

## 2024-02-05 MED ORDER — FAMOTIDINE 20 MG PO TABS: 20.0000 mg | ORAL_TABLET | Freq: Every day | ORAL | 1 refills | Status: AC

## 2024-02-05 MED ORDER — RYBELSUS 14 MG PO TABS: 1.0000 | ORAL_TABLET | Freq: Every day | ORAL | 6 refills | Status: DC

## 2024-02-05 MED ORDER — TRESIBA FLEXTOUCH 200 UNIT/ML ~~LOC~~ SOPN: 48.0000 [IU] | PEN_INJECTOR | SUBCUTANEOUS | 3 refills | Status: DC

## 2024-02-05 MED ORDER — NURTEC 75 MG PO TBDP: 75.0000 mg | ORAL_TABLET | ORAL | 3 refills | Status: DC

## 2024-02-05 MED ORDER — SYMBICORT 160-4.5 MCG/ACT IN AERO: 2.0000 | INHALATION_SPRAY | Freq: Two times a day (BID) | RESPIRATORY_TRACT | 11 refills | Status: DC

## 2024-02-05 MED ORDER — PANTOPRAZOLE SODIUM 40 MG PO TBEC: 40.0000 mg | DELAYED_RELEASE_TABLET | Freq: Two times a day (BID) | ORAL | 3 refills | Status: AC

## 2024-02-05 MED ORDER — ROSUVASTATIN CALCIUM 40 MG PO TABS: 40.0000 mg | ORAL_TABLET | Freq: Every day | ORAL | 1 refills | Status: DC

## 2024-02-05 MED ORDER — DAPAGLIFLOZIN PROPANEDIOL 10 MG PO TABS: ORAL_TABLET | ORAL | 1 refills | Status: DC

## 2024-02-05 MED ORDER — SUCRALFATE 1 G PO TABS: 1.0000 g | ORAL_TABLET | Freq: Four times a day (QID) | ORAL | 1 refills | Status: DC

## 2024-02-05 MED ORDER — ICOSAPENT ETHYL 1 G PO CAPS: 2.0000 g | ORAL_CAPSULE | Freq: Two times a day (BID) | ORAL | 1 refills | Status: DC

## 2024-02-05 MED ORDER — NOVOLOG FLEXPEN 100 UNIT/ML ~~LOC~~ SOPN: 10.0000 [IU] | PEN_INJECTOR | Freq: Three times a day (TID) | SUBCUTANEOUS | 11 refills | Status: AC

## 2024-02-05 MED ORDER — ARIPIPRAZOLE 15 MG PO TABS: 15.0000 mg | ORAL_TABLET | Freq: Every day | ORAL | 7 refills | Status: DC

## 2024-02-05 MED ORDER — TOPIRAMATE 25 MG PO TABS: 50.0000 mg | ORAL_TABLET | Freq: Every day | ORAL | 11 refills | Status: DC

## 2024-02-05 NOTE — Progress Notes (Signed)
 Established Patient Office Visit  Subjective:  Patient ID: Crystal Gibson, female    DOB: 1971/07/31  Age: 53 y.o. MRN: [id number]  Chief Complaint  Patient presents with   Follow-up    3 Months Follow Up    Patient in office for 3 month follow up, fasting labs not done. Will return another day.  Patient no longer does pap smears due to no uterus. Patient requesting an injectable medication for DM and weight loss, will send in Mounjaro . Will see if insurance pays for it prior to stopping Rybelsus .  Patient needing a referral for surgery, panniculectomy. Patient will call back with location to send referral to.     No other concerns at this time.   Past Medical History:  Diagnosis Date   Anxiety    Asthma    Bacterial conjunctivitis of right eye 03/16/2023   COVID 2021   Hospitalized with long Covid   Depression    Environmental allergies    Gestational diabetes    Pt is a type 2 diabetes   History of blood clots    when hospitalized for Salmonella poisoning and had Covid at the same time   Lumbar sprain 11/03/2018   Pericarditis 2021   while in hospital for Salmonella poisoning   Sesamoiditis of foot 11/05/2020   Sleep apnea    does not use a cpap    Past Surgical History:  Procedure Laterality Date   ABDOMINAL HYSTERECTOMY     OOPHORECTOMY     WISDOM TOOTH EXTRACTION      Social History   Socioeconomic History   Marital status: Single    Spouse name: Not on file   Number of children: Not on file   Years of education: Not on file   Highest education level: Not on file  Occupational History   Not on file  Tobacco Use   Smoking status: Never   Smokeless tobacco: Never  Vaping Use   Vaping status: Never Used  Substance and Sexual Activity   Alcohol use: No   Drug use: No   Sexual activity: Yes    Birth control/protection: Surgical  Other Topics Concern   Not on file  Social History Narrative   Not on file   Social Drivers of Health   Financial  Resource Strain: High Risk (08/06/2022)   Overall Financial Resource Strain (CARDIA)    Difficulty of Paying Living Expenses: Hard  Food Insecurity: Food Insecurity Present (08/06/2022)   Hunger Vital Sign    Worried About Running Out of Food in the Last Year: Sometimes true    Ran Out of Food in the Last Year: Sometimes true  Transportation Needs: No Transportation Needs (10/13/2022)   PRAPARE - Administrator, Civil Service (Medical): No    Lack of Transportation (Non-Medical): No  Physical Activity: Inactive (09/08/2022)   Exercise Vital Sign    Days of Exercise per Week: 0 days    Minutes of Exercise per Session: 0 min  Stress: Stress Concern Present (06/24/2022)   [employer] of Occupational Health - Occupational Stress Questionnaire    Feeling of Stress : To some extent  Social Connections: Socially Isolated (07/30/2022)   Social Connection and Isolation Panel [NHANES]    Frequency of Communication with Friends and Family: More than three times a week    Frequency of Social Gatherings with Friends and Family: More than three times a week    Attends Religious Services: Never    Active Member  of Clubs or Organizations: No    Attends Banker Meetings: Never    Marital Status: Never married  Intimate Partner Violence: Not At Risk (10/13/2022)   Humiliation, Afraid, Rape, and Kick questionnaire    Fear of Current or Ex-Partner: No    Emotionally Abused: No    Physically Abused: No    Sexually Abused: No    Family History  Problem Relation Age of Onset   Breast cancer Sister 73   Breast cancer Maternal Aunt    Breast cancer Paternal Aunt    Breast cancer Maternal Grandmother    Breast cancer Cousin     Allergies  Allergen Reactions   Glucophage [Metformin] Nausea And Vomiting    Outpatient Medications Prior to Visit  Medication Sig   ACCU-CHEK FASTCLIX LANCETS MISC    ADVAIR  HFA 115-21 MCG/ACT inhaler SMARTSIG:2 Puff(s) By Mouth Twice Daily    baclofen (LIORESAL) 10 MG tablet Take 10 mg by mouth 3 (three) times daily.   BD PEN NEEDLE NANO 2ND GEN 32G X 4 MM MISC USE THREE TIMES DAILY BEFORE MEALS   Blood Glucose Monitoring Suppl (ACCU-CHEK GUIDE) w/Device KIT USE DEVICE TO CHECK SUGARS DAILY   Butalbital -APAP-Caffeine  50-300-40 MG CAPS TAKE 1 CAPSULE BY MOUTH EVERY 6 HOURS AS NEEDED FOR PERSISTENT HEADACHE   Continuous Glucose Sensor (DEXCOM G6 SENSOR) MISC USE WITH DEXCOM RECEIVER AND TRANSMITTER TO TRACK BLOOD SUGAR VALUES CHANGE SENOR EVERY 10 DAYS AS DIRECTED   Continuous Glucose Transmitter (DEXCOM G6 TRANSMITTER) MISC 1 Box by Does not apply route as needed.   digoxin  (LANOXIN ) 0.25 MG tablet Take 1 tablet (250 mcg total) by mouth daily.   EPINEPHrine  0.3 mg/0.3 mL IJ SOAJ injection Inject 0.3 mg into the muscle as needed for anaphylaxis.   Evolocumab  (REPATHA  SURECLICK) 140 MG/ML SOAJ Inject 140 mg into the skin every 14 (fourteen) days.   fexofenadine -pseudoephedrine (ALLEGRA-D) 60-120 MG 12 hr tablet Take 1 tablet by mouth 2 (two) times daily.   fluticasone  (FLONASE ) 50 MCG/ACT nasal spray Place 1 spray into both nostrils 2 (two) times daily.   furosemide  (LASIX ) 20 MG tablet Take 1 tablet (20 mg total) by mouth daily.   glucose blood (ACCU-CHEK GUIDE) test strip Accu-Chek Guide In Vitro Strip QTY: 200 strip Days: 90 Refills: 1  Written: 04/27/20 Patient Instructions: TEST FASTING BLOOD SUGAR EVERY MORNING FASTING AND EVERY EVENING E11.65   Insulin  Pen Needle (GLOBAL EASY GLIDE PEN NEEDLES) 32G X 4 MM MISC Inject 4 Needles into the skin in the morning, at noon, in the evening, and at bedtime.   ivabradine  (CORLANOR) 7.5 MG TABS tablet Take 1 tablet (7.5 mg total) by mouth 2 (two) times daily.   Lancets Misc. (ACCU-CHEK FASTCLIX LANCET) KIT Accu-Chek Fastclix Lancet Drum  USE TO CHECK FASTING IN AM AND AS NEEDED FOR HIGH OR LOW   losartan  (COZAAR ) 25 MG tablet Take 1 tablet (25 mg total) by mouth daily.   metoprolol  succinate  (TOPROL -XL) 100 MG 24 hr tablet Take 2 tablets (200 mg total) by mouth daily.   OXYGEN Inhale 2 L into the lungs as needed (exertion).   potassium chloride  (KLOR-CON  M) 10 MEQ tablet TAKE 1 TABLET BY MOUTH EVERY DAILY WITH FUROSEMIDE  TO PREVENT POTASSIUM DEPLETION   traZODone  (DESYREL ) 100 MG tablet Take 200 mg by mouth at bedtime.    [DISCONTINUED] ARIPiprazole  (ABILIFY ) 15 MG tablet Take 15 mg by mouth daily.   [DISCONTINUED] dapagliflozin  propanediol (FARXIGA ) 10 MG TABS tablet TAKE 1 TABLET  BY MOUTH DAILY FOR DIABETES   [DISCONTINUED] DULoxetine  (CYMBALTA ) 60 MG capsule Take 60 mg by mouth daily.   [DISCONTINUED] famotidine  (PEPCID ) 20 MG tablet TAKE 1 TABLET BY MOUTH EVERY DAY   [DISCONTINUED] gabapentin  (NEURONTIN ) 300 MG capsule Take 1 capsule (300 mg total) by mouth 3 (three) times daily.   [DISCONTINUED] icosapent  Ethyl (VASCEPA ) 1 g capsule Take 2 capsules (2 g total) by mouth 2 (two) times daily.   [DISCONTINUED] NOVOLOG  FLEXPEN 100 UNIT/ML FlexPen Inject 10 Units into the skin 3 (three) times daily with meals.   [DISCONTINUED] pantoprazole  (PROTONIX ) 40 MG tablet Take 1 tablet (40 mg total) by mouth 2 (two) times daily.   [DISCONTINUED] Rimegepant Sulfate (NURTEC) 75 MG TBDP Take 1 tablet (75 mg total) by mouth over 48 hr. (Patient not taking: Reported on 02/02/2024)   [DISCONTINUED] rosuvastatin  (CRESTOR ) 40 MG tablet Take 1 tablet (40 mg total) by mouth daily.   [DISCONTINUED] Semaglutide  (RYBELSUS ) 14 MG TABS Take 1 tablet (14 mg total) by mouth daily.   [DISCONTINUED] sucralfate  (CARAFATE ) 1 g tablet Take 1 tablet (1 g total) by mouth 4 (four) times daily.   [DISCONTINUED] SYMBICORT  160-4.5 MCG/ACT inhaler Inhale 2 puffs into the lungs in the morning and at bedtime.   [DISCONTINUED] topiramate  (TOPAMAX ) 25 MG tablet Take 50 mg by mouth at bedtime.   [DISCONTINUED] TRESIBA  FLEXTOUCH 200 UNIT/ML FlexTouch Pen ADMINISTER 48 UNITS UNDER THE SKIN DAILY   No facility-administered  medications prior to visit.    Review of Systems  Constitutional: Negative.   HENT: Negative.    Eyes: Negative.   Respiratory: Negative.  Negative for shortness of breath.   Cardiovascular: Negative.  Negative for chest pain.  Gastrointestinal: Negative.  Negative for abdominal pain, constipation and diarrhea.  Genitourinary: Negative.   Musculoskeletal:  Negative for joint pain and myalgias.  Skin: Negative.   Neurological: Negative.  Negative for dizziness and headaches.  Endo/Heme/Allergies: Negative.   All other systems reviewed and are negative.      Objective:   BP (!) 148/82   Pulse 94   Ht 5' 9 (1.753 m)   Wt 229 lb (103.9 kg)   SpO2 95%   PF (!) 5 L/min   BMI 33.82 kg/m   Vitals:   02/05/24 0933  BP: (!) 148/82  Pulse: 94  Height: 5' 9 (1.753 m)  Weight: 229 lb (103.9 kg)  SpO2: 95%  PF: (!) 5 L/min  BMI (Calculated): 33.8    Physical Exam Vitals and nursing note reviewed.  Constitutional:      Appearance: Normal appearance. She is normal weight.  HENT:     Head: Normocephalic and atraumatic.     Nose: Nose normal.     Mouth/Throat:     Mouth: Mucous membranes are moist.  Eyes:     Extraocular Movements: Extraocular movements intact.     Conjunctiva/sclera: Conjunctivae normal.     Pupils: Pupils are equal, round, and reactive to light.  Cardiovascular:     Rate and Rhythm: Normal rate and regular rhythm.     Pulses: Normal pulses.     Heart sounds: Normal heart sounds.  Pulmonary:     Effort: Pulmonary effort is normal.     Breath sounds: Normal breath sounds.  Abdominal:     General: Abdomen is flat. Bowel sounds are normal.     Palpations: Abdomen is soft.  Musculoskeletal:        General: Normal range of motion.     Cervical back:  Normal range of motion.  Skin:    General: Skin is warm and dry.  Neurological:     General: No focal deficit present.     Mental Status: She is alert and oriented to person, place, and time.   Psychiatric:        Mood and Affect: Mood normal.        Behavior: Behavior normal.        Thought Content: Thought content normal.        Judgment: Judgment normal.      No results found for any visits on 02/05/24.  Recent Results (from the past 2160 hours)  POCT Glucose (CBG)     Status: Abnormal   Collection Time: 12/07/23  2:36 PM  Result Value Ref Range   POC Glucose 412 (A) 70 - 99 mg/dl  Comprehensive metabolic panel     Status: Abnormal   Collection Time: 01/15/24  9:30 AM  Result Value Ref Range   Glucose 311 (H) 70 - 99 mg/dL   BUN 7 6 - 24 mg/dL   Creatinine, Ser 9.08 0.57 - 1.00 mg/dL   eGFR 76 >40 fO/fpw/8.26   BUN/Creatinine Ratio 8 (L) 9 - 23   Sodium 140 134 - 144 mmol/L   Potassium 4.5 3.5 - 5.2 mmol/L   Chloride 99 96 - 106 mmol/L   CO2 23 20 - 29 mmol/L   Calcium  9.7 8.7 - 10.2 mg/dL   Total Protein 7.6 6.0 - 8.5 g/dL   Albumin 4.5 3.8 - 4.9 g/dL   Globulin, Total 3.1 1.5 - 4.5 g/dL   Bilirubin Total 0.6 0.0 - 1.2 mg/dL   Alkaline Phosphatase 85 44 - 121 IU/L   AST 26 0 - 40 IU/L   ALT 46 (H) 0 - 32 IU/L  Basic metabolic panel     Status: Abnormal   Collection Time: 02/03/24  9:03 AM  Result Value Ref Range   Glucose 259 (H) 70 - 99 mg/dL   BUN 12 6 - 24 mg/dL   Creatinine, Ser 9.23 0.57 - 1.00 mg/dL   eGFR 94 >40 fO/fpw/8.26   BUN/Creatinine Ratio 16 9 - 23   Sodium 142 134 - 144 mmol/L   Potassium 4.3 3.5 - 5.2 mmol/L   Chloride 101 96 - 106 mmol/L   CO2 23 20 - 29 mmol/L   Calcium  9.7 8.7 - 10.2 mg/dL      Assessment & Plan:  Mounjaro  sent to the pharmacy. Pending insurance approval, will stop Rybelsus .  Patient to call back with location to send referral to for surgery.   Problem List Items Addressed This Visit       Cardiovascular and Mediastinum   Multiple subsegmental pulmonary emboli without acute cor pulmonale (HCC)   Relevant Medications   icosapent  Ethyl (VASCEPA ) 1 g capsule   rosuvastatin  (CRESTOR ) 40 MG tablet    Inappropriate sinus tachycardia (HCC)   Relevant Medications   icosapent  Ethyl (VASCEPA ) 1 g capsule   rosuvastatin  (CRESTOR ) 40 MG tablet     Respiratory   Chronic respiratory failure with hypoxia (HCC)   Relevant Medications   icosapent  Ethyl (VASCEPA ) 1 g capsule   rosuvastatin  (CRESTOR ) 40 MG tablet   OSA (obstructive sleep apnea)   Relevant Medications   icosapent  Ethyl (VASCEPA ) 1 g capsule   rosuvastatin  (CRESTOR ) 40 MG tablet     Endocrine   Type 2 diabetes mellitus without complication, with long-term current use of insulin  (HCC) - Primary   Relevant Medications  dapagliflozin  propanediol (FARXIGA ) 10 MG TABS tablet   insulin  degludec (TRESIBA  FLEXTOUCH) 200 UNIT/ML FlexTouch Pen   Semaglutide  (RYBELSUS ) 14 MG TABS   NOVOLOG  FLEXPEN 100 UNIT/ML FlexPen   rosuvastatin  (CRESTOR ) 40 MG tablet   Rimegepant Sulfate (NURTEC) 75 MG TBDP   Other Relevant Orders   Hemoglobin A1c   Lipid Profile     Other   Mixed hyperlipidemia   Relevant Medications   icosapent  Ethyl (VASCEPA ) 1 g capsule   rosuvastatin  (CRESTOR ) 40 MG tablet   Other Relevant Orders   Lipid Profile   CMP14+EGFR   Diastolic dysfunction   Relevant Medications   icosapent  Ethyl (VASCEPA ) 1 g capsule   rosuvastatin  (CRESTOR ) 40 MG tablet   Other Visit Diagnoses       Thyroid  disorder screening       Relevant Orders   TSH     Type 2 diabetes mellitus with hyperglycemia, with long-term current use of insulin  (HCC)       Relevant Medications   dapagliflozin  propanediol (FARXIGA ) 10 MG TABS tablet   insulin  degludec (TRESIBA  FLEXTOUCH) 200 UNIT/ML FlexTouch Pen   Semaglutide  (RYBELSUS ) 14 MG TABS   NOVOLOG  FLEXPEN 100 UNIT/ML FlexPen   rosuvastatin  (CRESTOR ) 40 MG tablet     Essential hypertension       change to losartan  25   Relevant Medications   icosapent  Ethyl (VASCEPA ) 1 g capsule   rosuvastatin  (CRESTOR ) 40 MG tablet     Other chest pain       chest pain is getting worse, as cannot go to  mail box and get chest pain, advise echo, stress test   Relevant Medications   icosapent  Ethyl (VASCEPA ) 1 g capsule   rosuvastatin  (CRESTOR ) 40 MG tablet     Screening mammogram, encounter for       Relevant Medications   icosapent  Ethyl (VASCEPA ) 1 g capsule   rosuvastatin  (CRESTOR ) 40 MG tablet     Syncope, unspecified syncope type       Relevant Medications   icosapent  Ethyl (VASCEPA ) 1 g capsule   rosuvastatin  (CRESTOR ) 40 MG tablet       Return in about 3 months (around 05/04/2024) for with fasting labs prior.   Total time spent: 25 minutes  [employer], NP  02/05/2024   This document may have been prepared by Dragon Voice Recognition software and as such may include unintentional dictation errors.

## 2024-02-06 DIAGNOSIS — R000 Tachycardia, unspecified: Secondary | ICD-10-CM | POA: Diagnosis not present

## 2024-02-06 DIAGNOSIS — E119 Type 2 diabetes mellitus without complications: Secondary | ICD-10-CM | POA: Diagnosis not present

## 2024-02-06 DIAGNOSIS — R079 Chest pain, unspecified: Secondary | ICD-10-CM | POA: Diagnosis not present

## 2024-02-06 DIAGNOSIS — U071 COVID-19: Secondary | ICD-10-CM | POA: Diagnosis not present

## 2024-02-06 DIAGNOSIS — R0902 Hypoxemia: Secondary | ICD-10-CM | POA: Diagnosis not present

## 2024-02-06 DIAGNOSIS — Z794 Long term (current) use of insulin: Secondary | ICD-10-CM | POA: Diagnosis not present

## 2024-02-06 DIAGNOSIS — E1165 Type 2 diabetes mellitus with hyperglycemia: Secondary | ICD-10-CM | POA: Diagnosis not present

## 2024-02-06 DIAGNOSIS — Z9989 Dependence on other enabling machines and devices: Secondary | ICD-10-CM | POA: Diagnosis not present

## 2024-02-06 DIAGNOSIS — I10 Essential (primary) hypertension: Secondary | ICD-10-CM | POA: Diagnosis not present

## 2024-02-06 DIAGNOSIS — G4733 Obstructive sleep apnea (adult) (pediatric): Secondary | ICD-10-CM | POA: Diagnosis not present

## 2024-02-06 DIAGNOSIS — J45909 Unspecified asthma, uncomplicated: Secondary | ICD-10-CM | POA: Diagnosis not present

## 2024-02-07 DIAGNOSIS — R079 Chest pain, unspecified: Secondary | ICD-10-CM | POA: Diagnosis not present

## 2024-02-16 ENCOUNTER — Ambulatory Visit: Payer: Medicaid Other

## 2024-02-16 DIAGNOSIS — E782 Mixed hyperlipidemia: Secondary | ICD-10-CM

## 2024-02-16 DIAGNOSIS — I10 Essential (primary) hypertension: Secondary | ICD-10-CM

## 2024-02-16 DIAGNOSIS — I2694 Multiple subsegmental pulmonary emboli without acute cor pulmonale: Secondary | ICD-10-CM | POA: Diagnosis not present

## 2024-02-16 DIAGNOSIS — R0789 Other chest pain: Secondary | ICD-10-CM | POA: Diagnosis not present

## 2024-02-16 DIAGNOSIS — I5189 Other ill-defined heart diseases: Secondary | ICD-10-CM

## 2024-02-16 DIAGNOSIS — J9611 Chronic respiratory failure with hypoxia: Secondary | ICD-10-CM

## 2024-02-16 DIAGNOSIS — Z1231 Encounter for screening mammogram for malignant neoplasm of breast: Secondary | ICD-10-CM

## 2024-02-16 DIAGNOSIS — R55 Syncope and collapse: Secondary | ICD-10-CM

## 2024-02-16 DIAGNOSIS — I259 Chronic ischemic heart disease, unspecified: Secondary | ICD-10-CM

## 2024-02-16 DIAGNOSIS — G4733 Obstructive sleep apnea (adult) (pediatric): Secondary | ICD-10-CM

## 2024-02-16 DIAGNOSIS — I4711 Inappropriate sinus tachycardia, so stated: Secondary | ICD-10-CM

## 2024-02-16 MED ORDER — IOHEXOL 350 MG/ML SOLN
100.0000 mL | Freq: Once | INTRAVENOUS | Status: AC | PRN
  Administered 2024-02-16: 100 mL via INTRAVENOUS

## 2024-02-23 ENCOUNTER — Ambulatory Visit: Payer: Medicaid Other | Admitting: Cardiovascular Disease

## 2024-03-07 ENCOUNTER — Other Ambulatory Visit: Payer: Self-pay

## 2024-03-07 ENCOUNTER — Ambulatory Visit (INDEPENDENT_AMBULATORY_CARE_PROVIDER_SITE_OTHER): Payer: Medicaid Other | Admitting: Cardiovascular Disease

## 2024-03-07 ENCOUNTER — Encounter: Payer: Self-pay | Admitting: Cardiovascular Disease

## 2024-03-07 VITALS — BP 136/78 | HR 97 | Ht 68.0 in | Wt 227.0 lb

## 2024-03-07 DIAGNOSIS — R0789 Other chest pain: Secondary | ICD-10-CM | POA: Diagnosis not present

## 2024-03-07 DIAGNOSIS — R0602 Shortness of breath: Secondary | ICD-10-CM | POA: Diagnosis not present

## 2024-03-07 DIAGNOSIS — E782 Mixed hyperlipidemia: Secondary | ICD-10-CM

## 2024-03-07 DIAGNOSIS — I4711 Inappropriate sinus tachycardia, so stated: Secondary | ICD-10-CM | POA: Diagnosis not present

## 2024-03-07 DIAGNOSIS — J9611 Chronic respiratory failure with hypoxia: Secondary | ICD-10-CM

## 2024-03-07 MED ORDER — SUCRALFATE 1 G PO TABS: 1.0000 g | ORAL_TABLET | Freq: Four times a day (QID) | ORAL | 1 refills | Status: AC

## 2024-03-07 NOTE — Progress Notes (Signed)
 Cardiology Office Note   Date:  03/07/2024   ID:  Crystal Gibson, DOB 1971/12/21, MRN [id number]  PCP:  Marisue Ivan, NP  Cardiologist:  Adrian Blackwater, MD      History of Present Illness: Crystal Gibson is a 53 y.o. female who presents for  Chief Complaint  Patient presents with   Results    CCTA     Still very SOB and occasional chest pain      Past Medical History:  Diagnosis Date   Anxiety    Asthma    Bacterial conjunctivitis of right eye 03/16/2023   COVID 2021   Hospitalized with long Covid   Depression    Environmental allergies    Gestational diabetes    Pt is a type 2 diabetes   History of blood clots    when hospitalized for Salmonella poisoning and had Covid at the same time   Lumbar sprain 11/03/2018   Pericarditis 2021   while in hospital for Salmonella poisoning   Sesamoiditis of foot 11/05/2020   Sleep apnea    does not use a cpap     Past Surgical History:  Procedure Laterality Date   ABDOMINAL HYSTERECTOMY     OOPHORECTOMY     WISDOM TOOTH EXTRACTION       Current Outpatient Medications  Medication Sig Dispense Refill   ACCU-CHEK FASTCLIX LANCETS MISC   2   ADVAIR HFA 115-21 MCG/ACT inhaler SMARTSIG:2 Puff(s) By Mouth Twice Daily     ARIPiprazole (ABILIFY) 15 MG tablet Take 1 tablet (15 mg total) by mouth daily. 30 tablet 7   baclofen (LIORESAL) 10 MG tablet Take 10 mg by mouth 3 (three) times daily.     BD PEN NEEDLE NANO 2ND GEN 32G X 4 MM MISC USE THREE TIMES DAILY BEFORE MEALS 100 each 3   Blood Glucose Monitoring Suppl (ACCU-CHEK GUIDE) w/Device KIT USE DEVICE TO CHECK SUGARS DAILY     Butalbital-APAP-Caffeine 50-300-40 MG CAPS TAKE 1 CAPSULE BY MOUTH EVERY 6 HOURS AS NEEDED FOR PERSISTENT HEADACHE 120 capsule 0   Continuous Glucose Sensor (DEXCOM G6 SENSOR) MISC USE WITH DEXCOM RECEIVER AND TRANSMITTER TO TRACK BLOOD SUGAR VALUES CHANGE SENOR EVERY 10 DAYS AS DIRECTED 9 each 3   Continuous Glucose Transmitter (DEXCOM G6  TRANSMITTER) MISC 1 Box by Does not apply route as needed. 2 each 2   dapagliflozin propanediol (FARXIGA) 10 MG TABS tablet TAKE 1 TABLET BY MOUTH DAILY FOR DIABETES 90 tablet 1   digoxin (LANOXIN) 0.25 MG tablet Take 1 tablet (250 mcg total) by mouth daily. 90 tablet 1   DULoxetine (CYMBALTA) 60 MG capsule Take 1 capsule (60 mg total) by mouth daily. 90 capsule 3   EPINEPHrine 0.3 mg/0.3 mL IJ SOAJ injection Inject 0.3 mg into the muscle as needed for anaphylaxis. 1 each 6   Evolocumab (REPATHA SURECLICK) 140 MG/ML SOAJ Inject 140 mg into the skin every 14 (fourteen) days. 2 mL 6   famotidine (PEPCID) 20 MG tablet Take 1 tablet (20 mg total) by mouth daily. 90 tablet 1   fexofenadine-pseudoephedrine (ALLEGRA-D) 60-120 MG 12 hr tablet Take 1 tablet by mouth 2 (two) times daily. 30 tablet 11   fluticasone (FLONASE) 50 MCG/ACT nasal spray Place 1 spray into both nostrils 2 (two) times daily. 16 g 0   furosemide (LASIX) 20 MG tablet Take 1 tablet (20 mg total) by mouth daily. 90 tablet 1   gabapentin (NEURONTIN) 300 MG capsule Take 1 capsule (  300 mg total) by mouth 3 (three) times daily. 90 capsule 3   glucose blood (ACCU-CHEK GUIDE) test strip Accu-Chek Guide In Vitro Strip QTY: 200 strip Days: 90 Refills: 1  Written: 04/27/20 Patient Instructions: TEST FASTING BLOOD SUGAR EVERY MORNING FASTING AND EVERY EVENING E11.65     icosapent Ethyl (VASCEPA) 1 g capsule Take 2 capsules (2 g total) by mouth 2 (two) times daily. 120 capsule 1   insulin degludec (TRESIBA FLEXTOUCH) 200 UNIT/ML FlexTouch Pen Inject 48 Units into the skin daily. 9 mL 3   Insulin Pen Needle (GLOBAL EASY GLIDE PEN NEEDLES) 32G X 4 MM MISC Inject 4 Needles into the skin in the morning, at noon, in the evening, and at bedtime. 100 each 5   ivabradine (CORLANOR) 7.5 MG TABS tablet Take 1 tablet (7.5 mg total) by mouth 2 (two) times daily. 180 tablet 1   Lancets Misc. (ACCU-CHEK FASTCLIX LANCET) KIT Accu-Chek Fastclix Lancet Drum  USE TO  CHECK FASTING IN AM AND AS NEEDED FOR HIGH OR LOW     losartan (COZAAR) 25 MG tablet Take 1 tablet (25 mg total) by mouth daily. 90 tablet 1   metoprolol succinate (TOPROL-XL) 100 MG 24 hr tablet Take 2 tablets (200 mg total) by mouth daily. 90 tablet 1   NOVOLOG FLEXPEN 100 UNIT/ML FlexPen Inject 10 Units into the skin 3 (three) times daily with meals. 15 mL 11   OXYGEN Inhale 2 L into the lungs as needed (exertion).     pantoprazole (PROTONIX) 40 MG tablet Take 1 tablet (40 mg total) by mouth 2 (two) times daily. 180 tablet 3   potassium chloride (KLOR-CON M) 10 MEQ tablet TAKE 1 TABLET BY MOUTH EVERY DAILY WITH FUROSEMIDE TO PREVENT POTASSIUM DEPLETION 30 tablet 11   Rimegepant Sulfate (NURTEC) 75 MG TBDP Take 1 tablet (75 mg total) by mouth over 48 hr. 12 tablet 3   rosuvastatin (CRESTOR) 40 MG tablet Take 1 tablet (40 mg total) by mouth daily. 90 tablet 1   Semaglutide (RYBELSUS) 14 MG TABS Take 1 tablet (14 mg total) by mouth daily. 30 tablet 6   sucralfate (CARAFATE) 1 g tablet Take 1 tablet (1 g total) by mouth 4 (four) times daily. 120 tablet 1   SYMBICORT 160-4.5 MCG/ACT inhaler Inhale 2 puffs into the lungs in the morning and at bedtime. 1 each 11   tirzepatide (MOUNJARO) 2.5 MG/0.5ML Pen Inject 2.5 mg into the skin once a week. 2 mL 3   topiramate (TOPAMAX) 25 MG tablet Take 2 tablets (50 mg total) by mouth at bedtime. 60 tablet 11   traZODone (DESYREL) 100 MG tablet Take 200 mg by mouth at bedtime.      No current facility-administered medications for this visit.    Allergies:   Glucophage [metformin]    Social History:   reports that she has never smoked. She has never used smokeless tobacco. She reports that she does not drink alcohol and does not use drugs.   Family History:  family history includes Breast cancer in her cousin, maternal aunt, maternal grandmother, and paternal aunt; Breast cancer (age of onset: 24) in her sister.    ROS:     Review of Systems   Constitutional: Negative.   HENT: Negative.    Eyes: Negative.   Respiratory: Negative.    Gastrointestinal: Negative.   Genitourinary: Negative.   Musculoskeletal: Negative.   Skin: Negative.   Neurological: Negative.   Endo/Heme/Allergies: Negative.   Psychiatric/Behavioral: Negative.  All other systems reviewed and are negative.     All other systems are reviewed and negative.    PHYSICAL EXAM: VS:  BP 136/78   Pulse 97   Ht 5\' 8"  (1.727 m)   Wt 227 lb (103 kg)   SpO2 99%   BMI 34.52 kg/m  , BMI Body mass index is 34.52 kg/m. Last weight:  Wt Readings from Last 3 Encounters:  03/07/24 227 lb (103 kg)  02/05/24 229 lb (103.9 kg)  02/02/24 228 lb (103.4 kg)     Physical Exam Constitutional:      Appearance: Normal appearance.  Cardiovascular:     Rate and Rhythm: Normal rate and regular rhythm.     Heart sounds: Normal heart sounds.  Pulmonary:     Effort: Pulmonary effort is normal.     Breath sounds: Normal breath sounds.  Musculoskeletal:     Right lower leg: No edema.     Left lower leg: No edema.  Neurological:     Mental Status: She is alert.       EKG:   Recent Labs: 10/26/2023: TSH 0.813 01/15/2024: ALT 46 02/03/2024: BUN 12; Creatinine, Ser 0.76; Potassium 4.3; Sodium 142    Lipid Panel    Component Value Date/Time   CHOL 334 (H) 10/26/2023 1354   TRIG 247 (H) 10/26/2023 1354   HDL 35 (L) 10/26/2023 1354   CHOLHDL 9.5 (H) 10/26/2023 1354   LDLCALC 247 (H) 10/26/2023 1354      Other studies Reviewed: Additional studies/ records that were reviewed today include:  Review of the above records demonstrates:       No data to display            ASSESSMENT AND PLAN:    ICD-10-CM   1. SOB (shortness of breath)  R06.02    SOB related to underling astma, as LVEF was normal    2. Mixed hyperlipidemia  E78.2     3. Inappropriate sinus tachycardia (HCC)  I47.11    has HR 97 inspite of being on 200 of metoprolol, but advise at  night, and see if improves symptoms, otherwise go to 300    4. Chronic respiratory failure with hypoxia (HCC)  J96.11     5. Other chest pain  R07.89        Problem List Items Addressed This Visit       Cardiovascular and Mediastinum   Inappropriate sinus tachycardia (HCC)     Respiratory   Chronic respiratory failure with hypoxia (HCC)     Other   Mixed hyperlipidemia   Other Visit Diagnoses       SOB (shortness of breath)    -  Primary   SOB related to underling astma, as LVEF was normal     Other chest pain              Disposition:   Return in about 2 months (around 05/07/2024).    Total time spent: 30 minutes  Signed,  Adrian Blackwater, MD  03/07/2024 9:35 AM    [facility]

## 2024-03-10 ENCOUNTER — Encounter: Payer: Self-pay | Admitting: Plastic Surgery

## 2024-03-31 ENCOUNTER — Other Ambulatory Visit: Payer: Self-pay | Admitting: Cardiovascular Disease

## 2024-03-31 DIAGNOSIS — I5189 Other ill-defined heart diseases: Secondary | ICD-10-CM

## 2024-03-31 DIAGNOSIS — R0789 Other chest pain: Secondary | ICD-10-CM

## 2024-03-31 DIAGNOSIS — J9611 Chronic respiratory failure with hypoxia: Secondary | ICD-10-CM

## 2024-03-31 DIAGNOSIS — I10 Essential (primary) hypertension: Secondary | ICD-10-CM

## 2024-03-31 DIAGNOSIS — E782 Mixed hyperlipidemia: Secondary | ICD-10-CM

## 2024-03-31 DIAGNOSIS — Z1231 Encounter for screening mammogram for malignant neoplasm of breast: Secondary | ICD-10-CM

## 2024-03-31 DIAGNOSIS — G4733 Obstructive sleep apnea (adult) (pediatric): Secondary | ICD-10-CM

## 2024-03-31 DIAGNOSIS — I4711 Inappropriate sinus tachycardia, so stated: Secondary | ICD-10-CM

## 2024-03-31 DIAGNOSIS — I2694 Multiple subsegmental pulmonary emboli without acute cor pulmonale: Secondary | ICD-10-CM

## 2024-03-31 DIAGNOSIS — R55 Syncope and collapse: Secondary | ICD-10-CM

## 2024-04-07 ENCOUNTER — Encounter: Payer: Self-pay | Admitting: Cardiovascular Disease

## 2024-04-07 ENCOUNTER — Ambulatory Visit: Admitting: Cardiovascular Disease

## 2024-04-07 VITALS — BP 128/77 | HR 100 | Ht 68.0 in | Wt 230.0 lb

## 2024-04-07 DIAGNOSIS — R55 Syncope and collapse: Secondary | ICD-10-CM | POA: Diagnosis not present

## 2024-04-07 DIAGNOSIS — G4733 Obstructive sleep apnea (adult) (pediatric): Secondary | ICD-10-CM

## 2024-04-07 DIAGNOSIS — I10 Essential (primary) hypertension: Secondary | ICD-10-CM

## 2024-04-07 DIAGNOSIS — I4711 Inappropriate sinus tachycardia, so stated: Secondary | ICD-10-CM | POA: Diagnosis not present

## 2024-04-07 DIAGNOSIS — R0789 Other chest pain: Secondary | ICD-10-CM | POA: Diagnosis not present

## 2024-04-07 DIAGNOSIS — E782 Mixed hyperlipidemia: Secondary | ICD-10-CM

## 2024-04-07 DIAGNOSIS — G44001 Cluster headache syndrome, unspecified, intractable: Secondary | ICD-10-CM

## 2024-04-07 NOTE — Progress Notes (Signed)
 Cardiology Office Note   Date:  04/07/2024   ID:  PATRIECE Gibson, DOB September 13, 1971, MRN [id number]  PCP:  Marisue Ivan, NP  Cardiologist:  Adrian Blackwater, MD      History of Present Illness: Crystal Gibson is a 53 y.o. female who presents for  Chief Complaint  Patient presents with   Follow-up    1 month follow up     Has headache      Past Medical History:  Diagnosis Date   Anxiety    Asthma    Bacterial conjunctivitis of right eye 03/16/2023   COVID 2021   Hospitalized with long Covid   Depression    Environmental allergies    Gestational diabetes    Pt is a type 2 diabetes   History of blood clots    when hospitalized for Salmonella poisoning and had Covid at the same time   Lumbar sprain 11/03/2018   Pericarditis 2021   while in hospital for Salmonella poisoning   Sesamoiditis of foot 11/05/2020   Sleep apnea    does not use a cpap     Past Surgical History:  Procedure Laterality Date   ABDOMINAL HYSTERECTOMY     OOPHORECTOMY     WISDOM TOOTH EXTRACTION       Current Outpatient Medications  Medication Sig Dispense Refill   ACCU-CHEK FASTCLIX LANCETS MISC   2   ADVAIR HFA 115-21 MCG/ACT inhaler SMARTSIG:2 Puff(s) By Mouth Twice Daily     ARIPiprazole (ABILIFY) 15 MG tablet Take 1 tablet (15 mg total) by mouth daily. 30 tablet 7   baclofen (LIORESAL) 10 MG tablet Take 10 mg by mouth 3 (three) times daily.     BD PEN NEEDLE NANO 2ND GEN 32G X 4 MM MISC USE THREE TIMES DAILY BEFORE MEALS 100 each 3   Blood Glucose Monitoring Suppl (ACCU-CHEK GUIDE) w/Device KIT USE DEVICE TO CHECK SUGARS DAILY     Butalbital-APAP-Caffeine 50-300-40 MG CAPS TAKE 1 CAPSULE BY MOUTH EVERY 6 HOURS AS NEEDED FOR PERSISTENT HEADACHE 120 capsule 0   Continuous Glucose Sensor (DEXCOM G6 SENSOR) MISC USE WITH DEXCOM RECEIVER AND TRANSMITTER TO TRACK BLOOD SUGAR VALUES CHANGE SENOR EVERY 10 DAYS AS DIRECTED 9 each 3   Continuous Glucose Transmitter (DEXCOM G6 TRANSMITTER) MISC  1 Box by Does not apply route as needed. 2 each 2   dapagliflozin propanediol (FARXIGA) 10 MG TABS tablet TAKE 1 TABLET BY MOUTH DAILY FOR DIABETES 90 tablet 1   digoxin (LANOXIN) 0.25 MG tablet Take 1 tablet (250 mcg total) by mouth daily. 90 tablet 1   DULoxetine (CYMBALTA) 60 MG capsule Take 1 capsule (60 mg total) by mouth daily. 90 capsule 3   EPINEPHrine 0.3 mg/0.3 mL IJ SOAJ injection Inject 0.3 mg into the muscle as needed for anaphylaxis. 1 each 6   Evolocumab (REPATHA SURECLICK) 140 MG/ML SOAJ Inject 140 mg into the skin every 14 (fourteen) days. 2 mL 6   famotidine (PEPCID) 20 MG tablet Take 1 tablet (20 mg total) by mouth daily. 90 tablet 1   fexofenadine-pseudoephedrine (ALLEGRA-D) 60-120 MG 12 hr tablet Take 1 tablet by mouth 2 (two) times daily. 30 tablet 11   fluticasone (FLONASE) 50 MCG/ACT nasal spray Place 1 spray into both nostrils 2 (two) times daily. 16 g 0   furosemide (LASIX) 20 MG tablet Take 1 tablet (20 mg total) by mouth daily. 90 tablet 1   gabapentin (NEURONTIN) 300 MG capsule Take 1 capsule (300 mg  total) by mouth 3 (three) times daily. 90 capsule 3   glucose blood (ACCU-CHEK GUIDE) test strip Accu-Chek Guide In Vitro Strip QTY: 200 strip Days: 90 Refills: 1  Written: 04/27/20 Patient Instructions: TEST FASTING BLOOD SUGAR EVERY MORNING FASTING AND EVERY EVENING E11.65     icosapent Ethyl (VASCEPA) 1 g capsule TAKE 2 CAPSULES(2 GRAMS) BY MOUTH TWICE DAILY 120 capsule 1   insulin degludec (TRESIBA FLEXTOUCH) 200 UNIT/ML FlexTouch Pen Inject 48 Units into the skin daily. 9 mL 3   Insulin Pen Needle (GLOBAL EASY GLIDE PEN NEEDLES) 32G X 4 MM MISC Inject 4 Needles into the skin in the morning, at noon, in the evening, and at bedtime. 100 each 5   ivabradine (CORLANOR) 7.5 MG TABS tablet Take 1 tablet (7.5 mg total) by mouth 2 (two) times daily. 180 tablet 1   Lancets Misc. (ACCU-CHEK FASTCLIX LANCET) KIT Accu-Chek Fastclix Lancet Drum  USE TO CHECK FASTING IN AM AND AS  NEEDED FOR HIGH OR LOW     losartan (COZAAR) 25 MG tablet Take 1 tablet (25 mg total) by mouth daily. 90 tablet 1   metoprolol succinate (TOPROL-XL) 100 MG 24 hr tablet Take 2 tablets (200 mg total) by mouth daily. 90 tablet 1   NOVOLOG FLEXPEN 100 UNIT/ML FlexPen Inject 10 Units into the skin 3 (three) times daily with meals. 15 mL 11   OXYGEN Inhale 2 L into the lungs as needed (exertion).     pantoprazole (PROTONIX) 40 MG tablet Take 1 tablet (40 mg total) by mouth 2 (two) times daily. 180 tablet 3   potassium chloride (KLOR-CON M) 10 MEQ tablet TAKE 1 TABLET BY MOUTH EVERY DAILY WITH FUROSEMIDE TO PREVENT POTASSIUM DEPLETION 30 tablet 11   Rimegepant Sulfate (NURTEC) 75 MG TBDP Take 1 tablet (75 mg total) by mouth over 48 hr. 12 tablet 3   rosuvastatin (CRESTOR) 40 MG tablet Take 1 tablet (40 mg total) by mouth daily. 90 tablet 1   Semaglutide (RYBELSUS) 14 MG TABS Take 1 tablet (14 mg total) by mouth daily. 30 tablet 6   sucralfate (CARAFATE) 1 g tablet Take 1 tablet (1 g total) by mouth 4 (four) times daily. 120 tablet 1   SYMBICORT 160-4.5 MCG/ACT inhaler Inhale 2 puffs into the lungs in the morning and at bedtime. 1 each 11   tirzepatide (MOUNJARO) 2.5 MG/0.5ML Pen Inject 2.5 mg into the skin once a week. 2 mL 3   topiramate (TOPAMAX) 25 MG tablet Take 2 tablets (50 mg total) by mouth at bedtime. 60 tablet 11   traZODone (DESYREL) 100 MG tablet Take 200 mg by mouth at bedtime.      No current facility-administered medications for this visit.    Allergies:   Glucophage [metformin]    Social History:   reports that she has never smoked. She has never used smokeless tobacco. She reports that she does not drink alcohol and does not use drugs.   Family History:  family history includes Breast cancer in her cousin, maternal aunt, maternal grandmother, and paternal aunt; Breast cancer (age of onset: 21) in her sister.    ROS:     Review of Systems  Constitutional: Negative.   HENT:  Negative.    Eyes: Negative.   Respiratory: Negative.    Gastrointestinal: Negative.   Genitourinary: Negative.   Musculoskeletal: Negative.   Skin: Negative.   Neurological: Negative.   Endo/Heme/Allergies: Negative.   Psychiatric/Behavioral: Negative.    All other systems reviewed and  are negative.     All other systems are reviewed and negative.    PHYSICAL EXAM: VS:  BP 128/77   Pulse 100   Ht 5\' 8"  (1.727 m)   Wt 230 lb (104.3 kg)   SpO2 99%   BMI 34.97 kg/m  , BMI Body mass index is 34.97 kg/m. Last weight:  Wt Readings from Last 3 Encounters:  04/07/24 230 lb (104.3 kg)  03/07/24 227 lb (103 kg)  02/05/24 229 lb (103.9 kg)     Physical Exam Constitutional:      Appearance: Normal appearance.  Cardiovascular:     Rate and Rhythm: Normal rate and regular rhythm.     Heart sounds: Normal heart sounds.  Pulmonary:     Effort: Pulmonary effort is normal.     Breath sounds: Normal breath sounds.  Musculoskeletal:     Right lower leg: No edema.     Left lower leg: No edema.  Neurological:     Mental Status: She is alert.       EKG:   Recent Labs: 10/26/2023: TSH 0.813 01/15/2024: ALT 46 02/03/2024: BUN 12; Creatinine, Ser 0.76; Potassium 4.3; Sodium 142    Lipid Panel    Component Value Date/Time   CHOL 334 (H) 10/26/2023 1354   TRIG 247 (H) 10/26/2023 1354   HDL 35 (L) 10/26/2023 1354   CHOLHDL 9.5 (H) 10/26/2023 1354   LDLCALC 247 (H) 10/26/2023 1354      Other studies Reviewed: Additional studies/ records that were reviewed today include:  Review of the above records demonstrates:       No data to display            ASSESSMENT AND PLAN:    ICD-10-CM   1. Syncope, unspecified syncope type  R55    resolved    2. OSA (obstructive sleep apnea)  G47.33     3. Essential hypertension  I10     4. Inappropriate sinus tachycardia (HCC)  I47.11    HR 100, much better    5. Mixed hyperlipidemia  E78.2     6. Intractable cluster  headache syndrome, unspecified chronicity pattern  G44.001    Advise seeing Amber    7. Chest pain, non-cardiac  R07.89    CCTA  had normal coronaries, and ca score was 0.       Problem List Items Addressed This Visit       Cardiovascular and Mediastinum   Inappropriate sinus tachycardia (HCC)     Respiratory   OSA (obstructive sleep apnea)     Other   Mixed hyperlipidemia   Other Visit Diagnoses       Syncope, unspecified syncope type    -  Primary   resolved     Essential hypertension         Intractable cluster headache syndrome, unspecified chronicity pattern       Advise seeing Amber     Chest pain, non-cardiac       CCTA  had normal coronaries, and ca score was 0.          Disposition:   Return in about 3 months (around 07/07/2024).    Total time spent: 30 minutes  Signed,  Adrian Blackwater, MD  04/07/2024 10:36 AM    [facility]

## 2024-04-11 ENCOUNTER — Ambulatory Visit: Admitting: Cardiology

## 2024-04-11 ENCOUNTER — Encounter: Payer: Self-pay | Admitting: Cardiology

## 2024-04-11 VITALS — BP 128/64 | HR 104 | Ht 68.0 in | Wt 230.0 lb

## 2024-04-11 DIAGNOSIS — R238 Other skin changes: Secondary | ICD-10-CM | POA: Diagnosis not present

## 2024-04-11 DIAGNOSIS — R519 Headache, unspecified: Secondary | ICD-10-CM | POA: Diagnosis not present

## 2024-04-11 DIAGNOSIS — Z0130 Encounter for examination of blood pressure without abnormal findings: Secondary | ICD-10-CM

## 2024-04-11 MED ORDER — TOPIRAMATE 50 MG PO TABS: 50.0000 mg | ORAL_TABLET | Freq: Every day | ORAL | 6 refills | Status: DC

## 2024-04-11 MED ORDER — HYDROCORTISONE 1 % EX CREA: TOPICAL_CREAM | CUTANEOUS | 1 refills | Status: DC

## 2024-04-11 NOTE — Progress Notes (Signed)
 Established Patient Office Visit  Subjective:  Patient ID: Crystal Gibson, female    DOB: 11/27/1971  Age: 53 y.o. MRN: [id number]  Chief Complaint  Patient presents with   Acute Visit    Headaches and Raised Rash on L Side of face    Patient in office for an acute visit, complaining of headaches and rash on left side of face.  Patient has not been taking her Topamax, will restart.  Raised rash on left side of face, itching. No new soaps or detergents. Will send in hydrocortisone cream. Patient concerned about some moles on her legs, ear, and face. Will send referral to dermatology for skin check.   Rash This is a new problem. The current episode started in the past 7 days. The problem has been gradually worsening since onset. The affected locations include the face. The rash is characterized by itchiness, dryness and pain. She was exposed to nothing. Pertinent negatives include no shortness of breath. Past treatments include nothing. The treatment provided no relief.    No other concerns at this time.   Past Medical History:  Diagnosis Date   Anxiety    Asthma    Bacterial conjunctivitis of right eye 03/16/2023   COVID 2021   Hospitalized with long Covid   Depression    Environmental allergies    Gestational diabetes    Pt is a type 2 diabetes   History of blood clots    when hospitalized for Salmonella poisoning and had Covid at the same time   Lumbar sprain 11/03/2018   Pericarditis 2021   while in hospital for Salmonella poisoning   Sesamoiditis of foot 11/05/2020   Sleep apnea    does not use a cpap    Past Surgical History:  Procedure Laterality Date   ABDOMINAL HYSTERECTOMY     OOPHORECTOMY     WISDOM TOOTH EXTRACTION      Social History   Socioeconomic History   Marital status: Single    Spouse name: Not on file   Number of children: Not on file   Years of education: Not on file   Highest education level: Not on file  Occupational History   Not on  file  Tobacco Use   Smoking status: Never   Smokeless tobacco: Never  Vaping Use   Vaping status: Never Used  Substance and Sexual Activity   Alcohol use: No   Drug use: No   Sexual activity: Yes    Birth control/protection: Surgical  Other Topics Concern   Not on file  Social History Narrative   Not on file   Social Drivers of Health   Financial Resource Strain: High Risk (08/06/2022)   Overall Financial Resource Strain (CARDIA)    Difficulty of Paying Living Expenses: Hard  Food Insecurity: Food Insecurity Present (08/06/2022)   Hunger Vital Sign    Worried About Running Out of Food in the Last Year: Sometimes true    Ran Out of Food in the Last Year: Sometimes true  Transportation Needs: No Transportation Needs (10/13/2022)   PRAPARE - Administrator, Civil Service (Medical): No    Lack of Transportation (Non-Medical): No  Physical Activity: Inactive (09/08/2022)   Exercise Vital Sign    Days of Exercise per Week: 0 days    Minutes of Exercise per Session: 0 min  Stress: Stress Concern Present (06/24/2022)   [employer] of Occupational Health - Occupational Stress Questionnaire    Feeling of Stress : To  some extent  Social Connections: Socially Isolated (07/30/2022)   Social Connection and Isolation Panel [NHANES]    Frequency of Communication with Friends and Family: More than three times a week    Frequency of Social Gatherings with Friends and Family: More than three times a week    Attends Religious Services: Never    Database administrator or Organizations: No    Attends Banker Meetings: Never    Marital Status: Never married  Intimate Partner Violence: Not At Risk (10/13/2022)   Humiliation, Afraid, Rape, and Kick questionnaire    Fear of Current or Ex-Partner: No    Emotionally Abused: No    Physically Abused: No    Sexually Abused: No    Family History  Problem Relation Age of Onset   Breast cancer Sister 27   Breast cancer  Maternal Aunt    Breast cancer Paternal Aunt    Breast cancer Maternal Grandmother    Breast cancer Cousin     Allergies  Allergen Reactions   Glucophage [Metformin] Nausea And Vomiting    Outpatient Medications Prior to Visit  Medication Sig   ACCU-CHEK FASTCLIX LANCETS MISC    ADVAIR HFA 115-21 MCG/ACT inhaler SMARTSIG:2 Puff(s) By Mouth Twice Daily   ARIPiprazole (ABILIFY) 15 MG tablet Take 1 tablet (15 mg total) by mouth daily.   baclofen (LIORESAL) 10 MG tablet Take 10 mg by mouth 3 (three) times daily.   BD PEN NEEDLE NANO 2ND GEN 32G X 4 MM MISC USE THREE TIMES DAILY BEFORE MEALS   Blood Glucose Monitoring Suppl (ACCU-CHEK GUIDE) w/Device KIT USE DEVICE TO CHECK SUGARS DAILY   Butalbital-APAP-Caffeine 50-300-40 MG CAPS TAKE 1 CAPSULE BY MOUTH EVERY 6 HOURS AS NEEDED FOR PERSISTENT HEADACHE   Continuous Glucose Sensor (DEXCOM G6 SENSOR) MISC USE WITH DEXCOM RECEIVER AND TRANSMITTER TO TRACK BLOOD SUGAR VALUES CHANGE SENOR EVERY 10 DAYS AS DIRECTED   Continuous Glucose Transmitter (DEXCOM G6 TRANSMITTER) MISC 1 Box by Does not apply route as needed.   dapagliflozin propanediol (FARXIGA) 10 MG TABS tablet TAKE 1 TABLET BY MOUTH DAILY FOR DIABETES   digoxin (LANOXIN) 0.25 MG tablet Take 1 tablet (250 mcg total) by mouth daily.   DULoxetine (CYMBALTA) 60 MG capsule Take 1 capsule (60 mg total) by mouth daily.   EPINEPHrine 0.3 mg/0.3 mL IJ SOAJ injection Inject 0.3 mg into the muscle as needed for anaphylaxis.   Evolocumab (REPATHA SURECLICK) 140 MG/ML SOAJ Inject 140 mg into the skin every 14 (fourteen) days.   famotidine (PEPCID) 20 MG tablet Take 1 tablet (20 mg total) by mouth daily.   fexofenadine-pseudoephedrine (ALLEGRA-D) 60-120 MG 12 hr tablet Take 1 tablet by mouth 2 (two) times daily.   fluticasone (FLONASE) 50 MCG/ACT nasal spray Place 1 spray into both nostrils 2 (two) times daily.   furosemide (LASIX) 20 MG tablet Take 1 tablet (20 mg total) by mouth daily.   gabapentin  (NEURONTIN) 300 MG capsule Take 1 capsule (300 mg total) by mouth 3 (three) times daily.   glucose blood (ACCU-CHEK GUIDE) test strip Accu-Chek Guide In Vitro Strip QTY: 200 strip Days: 90 Refills: 1  Written: 04/27/20 Patient Instructions: TEST FASTING BLOOD SUGAR EVERY MORNING FASTING AND EVERY EVENING E11.65   icosapent Ethyl (VASCEPA) 1 g capsule TAKE 2 CAPSULES(2 GRAMS) BY MOUTH TWICE DAILY   insulin degludec (TRESIBA FLEXTOUCH) 200 UNIT/ML FlexTouch Pen Inject 48 Units into the skin daily.   Insulin Pen Needle (GLOBAL EASY GLIDE PEN NEEDLES) 32G  X 4 MM MISC Inject 4 Needles into the skin in the morning, at noon, in the evening, and at bedtime.   ivabradine (CORLANOR) 7.5 MG TABS tablet Take 1 tablet (7.5 mg total) by mouth 2 (two) times daily.   Lancets Misc. (ACCU-CHEK FASTCLIX LANCET) KIT Accu-Chek Fastclix Lancet Drum  USE TO CHECK FASTING IN AM AND AS NEEDED FOR HIGH OR LOW   losartan (COZAAR) 25 MG tablet Take 1 tablet (25 mg total) by mouth daily.   metoprolol succinate (TOPROL-XL) 100 MG 24 hr tablet Take 2 tablets (200 mg total) by mouth daily.   NOVOLOG FLEXPEN 100 UNIT/ML FlexPen Inject 10 Units into the skin 3 (three) times daily with meals.   OXYGEN Inhale 2 L into the lungs as needed (exertion).   pantoprazole (PROTONIX) 40 MG tablet Take 1 tablet (40 mg total) by mouth 2 (two) times daily.   potassium chloride (KLOR-CON M) 10 MEQ tablet TAKE 1 TABLET BY MOUTH EVERY DAILY WITH FUROSEMIDE TO PREVENT POTASSIUM DEPLETION   Rimegepant Sulfate (NURTEC) 75 MG TBDP Take 1 tablet (75 mg total) by mouth over 48 hr.   rosuvastatin (CRESTOR) 40 MG tablet Take 1 tablet (40 mg total) by mouth daily.   Semaglutide (RYBELSUS) 14 MG TABS Take 1 tablet (14 mg total) by mouth daily.   sucralfate (CARAFATE) 1 g tablet Take 1 tablet (1 g total) by mouth 4 (four) times daily.   SYMBICORT 160-4.5 MCG/ACT inhaler Inhale 2 puffs into the lungs in the morning and at bedtime.   tirzepatide (MOUNJARO) 2.5  MG/0.5ML Pen Inject 2.5 mg into the skin once a week.   traZODone (DESYREL) 100 MG tablet Take 200 mg by mouth at bedtime.    [DISCONTINUED] topiramate (TOPAMAX) 25 MG tablet Take 2 tablets (50 mg total) by mouth at bedtime.   No facility-administered medications prior to visit.    Review of Systems  HENT: Negative.    Respiratory: Negative.  Negative for shortness of breath.   All other systems reviewed and are negative.      Objective:   BP 128/64   Pulse (!) 104   Ht 5\' 8"  (1.727 m)   Wt 230 lb (104.3 kg)   SpO2 96%   PF (!) 4 L/min   BMI 34.97 kg/m   Vitals:   04/11/24 1335  BP: 128/64  Pulse: (!) 104  Height: 5\' 8"  (1.727 m)  Weight: 230 lb (104.3 kg)  SpO2: 96%  PF: (!) 4 L/min  BMI (Calculated): 34.98    Physical Exam Vitals and nursing note reviewed.  Constitutional:      Appearance: Normal appearance. She is normal weight.  HENT:     Head: Normocephalic and atraumatic.     Nose: Nose normal.     Mouth/Throat:     Mouth: Mucous membranes are moist.  Eyes:     Extraocular Movements: Extraocular movements intact.     Conjunctiva/sclera: Conjunctivae normal.     Pupils: Pupils are equal, round, and reactive to light.  Cardiovascular:     Rate and Rhythm: Regular rhythm.     Pulses: Normal pulses.     Heart sounds: Normal heart sounds.  Pulmonary:     Effort: Pulmonary effort is normal.     Breath sounds: Normal breath sounds.  Abdominal:     General: Abdomen is flat. Bowel sounds are normal.     Palpations: Abdomen is soft.  Musculoskeletal:        General: Normal range of motion.  Cervical back: Normal range of motion.  Skin:    General: Skin is warm and dry.  Neurological:     General: No focal deficit present.     Mental Status: She is alert and oriented to person, place, and time.  Psychiatric:        Mood and Affect: Mood normal.        Behavior: Behavior normal.        Thought Content: Thought content normal.        Judgment:  Judgment normal.      No results found for any visits on 04/11/24.  Recent Results (from the past 2160 hours)  Comprehensive metabolic panel     Status: Abnormal   Collection Time: 01/15/24  9:30 AM  Result Value Ref Range   Glucose 311 (H) 70 - 99 mg/dL   BUN 7 6 - 24 mg/dL   Creatinine, Ser 1.61 0.57 - 1.00 mg/dL   eGFR 76 >09 UE/AVW/0.98   BUN/Creatinine Ratio 8 (L) 9 - 23   Sodium 140 134 - 144 mmol/L   Potassium 4.5 3.5 - 5.2 mmol/L   Chloride 99 96 - 106 mmol/L   CO2 23 20 - 29 mmol/L   Calcium 9.7 8.7 - 10.2 mg/dL   Total Protein 7.6 6.0 - 8.5 g/dL   Albumin 4.5 3.8 - 4.9 g/dL   Globulin, Total 3.1 1.5 - 4.5 g/dL   Bilirubin Total 0.6 0.0 - 1.2 mg/dL   Alkaline Phosphatase 85 44 - 121 IU/L   AST 26 0 - 40 IU/L   ALT 46 (H) 0 - 32 IU/L  Basic metabolic panel     Status: Abnormal   Collection Time: 02/03/24  9:03 AM  Result Value Ref Range   Glucose 259 (H) 70 - 99 mg/dL   BUN 12 6 - 24 mg/dL   Creatinine, Ser 1.19 0.57 - 1.00 mg/dL   eGFR 94 >14 NW/GNF/6.21   BUN/Creatinine Ratio 16 9 - 23   Sodium 142 134 - 144 mmol/L   Potassium 4.3 3.5 - 5.2 mmol/L   Chloride 101 96 - 106 mmol/L   CO2 23 20 - 29 mmol/L   Calcium 9.7 8.7 - 10.2 mg/dL      Assessment & Plan:  Restart Topamax.  Hydrocortisone cream for rash. Referral sent to dermatology for mole changes.   Problem List Items Addressed This Visit       Musculoskeletal and Integument   Other skin changes - Primary   Relevant Orders   Ambulatory referral to Dermatology     Other   Nonintractable headache   Relevant Medications   topiramate (TOPAMAX) 50 MG tablet    Return if symptoms worsen or fail to improve.   Total time spent: 25 minutes  [employer], NP  04/11/2024   This document may have been prepared by Dragon Voice Recognition software and as such may include unintentional dictation errors.

## 2024-04-13 DIAGNOSIS — D229 Melanocytic nevi, unspecified: Secondary | ICD-10-CM | POA: Diagnosis not present

## 2024-04-13 DIAGNOSIS — D2239 Melanocytic nevi of other parts of face: Secondary | ICD-10-CM | POA: Diagnosis not present

## 2024-04-13 DIAGNOSIS — D485 Neoplasm of uncertain behavior of skin: Secondary | ICD-10-CM | POA: Diagnosis not present

## 2024-04-13 DIAGNOSIS — D2221 Melanocytic nevi of right ear and external auricular canal: Secondary | ICD-10-CM | POA: Diagnosis not present

## 2024-04-25 ENCOUNTER — Other Ambulatory Visit: Payer: Self-pay

## 2024-04-25 DIAGNOSIS — E1165 Type 2 diabetes mellitus with hyperglycemia: Secondary | ICD-10-CM

## 2024-04-25 MED ORDER — DEXCOM G7 SENSOR MISC: 11 refills | Status: DC

## 2024-04-25 MED ORDER — RYBELSUS 14 MG PO TABS: 1.0000 | ORAL_TABLET | Freq: Every day | ORAL | 6 refills | Status: DC

## 2024-04-25 MED ORDER — DEXCOM G7 RECEIVER DEVI: 0 refills | Status: DC

## 2024-04-29 ENCOUNTER — Other Ambulatory Visit: Payer: Self-pay | Admitting: Cardiovascular Disease

## 2024-04-29 ENCOUNTER — Other Ambulatory Visit: Payer: Self-pay | Admitting: Internal Medicine

## 2024-04-29 DIAGNOSIS — R55 Syncope and collapse: Secondary | ICD-10-CM

## 2024-04-29 DIAGNOSIS — I2694 Multiple subsegmental pulmonary emboli without acute cor pulmonale: Secondary | ICD-10-CM

## 2024-04-29 DIAGNOSIS — G4733 Obstructive sleep apnea (adult) (pediatric): Secondary | ICD-10-CM

## 2024-04-29 DIAGNOSIS — I10 Essential (primary) hypertension: Secondary | ICD-10-CM

## 2024-04-29 DIAGNOSIS — J9611 Chronic respiratory failure with hypoxia: Secondary | ICD-10-CM

## 2024-04-29 DIAGNOSIS — H109 Unspecified conjunctivitis: Secondary | ICD-10-CM

## 2024-04-29 DIAGNOSIS — E782 Mixed hyperlipidemia: Secondary | ICD-10-CM

## 2024-04-29 DIAGNOSIS — I4711 Inappropriate sinus tachycardia, so stated: Secondary | ICD-10-CM

## 2024-04-29 DIAGNOSIS — Z1231 Encounter for screening mammogram for malignant neoplasm of breast: Secondary | ICD-10-CM

## 2024-04-29 DIAGNOSIS — R0789 Other chest pain: Secondary | ICD-10-CM

## 2024-04-29 DIAGNOSIS — I5189 Other ill-defined heart diseases: Secondary | ICD-10-CM

## 2024-05-02 ENCOUNTER — Other Ambulatory Visit: Payer: Self-pay

## 2024-05-03 MED ORDER — BUTALBITAL-APAP-CAFFEINE 50-300-40 MG PO CAPS: 1.0000 | ORAL_CAPSULE | Freq: Four times a day (QID) | ORAL | 1 refills | Status: AC | PRN

## 2024-05-12 ENCOUNTER — Ambulatory Visit: Payer: Medicaid Other | Admitting: Cardiology

## 2024-05-13 ENCOUNTER — Other Ambulatory Visit

## 2024-05-13 DIAGNOSIS — Z1329 Encounter for screening for other suspected endocrine disorder: Secondary | ICD-10-CM | POA: Diagnosis not present

## 2024-05-13 DIAGNOSIS — E782 Mixed hyperlipidemia: Secondary | ICD-10-CM | POA: Diagnosis not present

## 2024-05-13 DIAGNOSIS — Z794 Long term (current) use of insulin: Secondary | ICD-10-CM | POA: Diagnosis not present

## 2024-05-13 DIAGNOSIS — E119 Type 2 diabetes mellitus without complications: Secondary | ICD-10-CM | POA: Diagnosis not present

## 2024-05-14 LAB — LIPID PANEL
Chol/HDL Ratio: 3.7 ratio (ref 0.0–4.4)
Cholesterol, Total: 131 mg/dL (ref 100–199)
HDL: 35 mg/dL — ABNORMAL LOW (ref >39)
LDL Chol Calc (NIH): 62 mg/dL (ref 0–99)
Triglycerides: 208 mg/dL — ABNORMAL HIGH (ref 0–149)
VLDL Cholesterol Cal: 34 mg/dL (ref 5–40)

## 2024-05-14 LAB — CMP14+EGFR
ALT: 44 IU/L — ABNORMAL HIGH (ref 0–32)
AST: 30 IU/L (ref 0–40)
Albumin: 3.8 g/dL (ref 3.8–4.9)
Alkaline Phosphatase: 58 IU/L (ref 44–121)
BUN/Creatinine Ratio: 9 (ref 9–23)
BUN: 8 mg/dL (ref 6–24)
Bilirubin Total: 0.5 mg/dL (ref 0.0–1.2)
CO2: 24 mmol/L (ref 20–29)
Calcium: 8.6 mg/dL — ABNORMAL LOW (ref 8.7–10.2)
Chloride: 104 mmol/L (ref 96–106)
Creatinine, Ser: 0.89 mg/dL (ref 0.57–1.00)
Globulin, Total: 1.8 g/dL (ref 1.5–4.5)
Glucose: 159 mg/dL — ABNORMAL HIGH (ref 70–99)
Potassium: 4.3 mmol/L (ref 3.5–5.2)
Sodium: 140 mmol/L (ref 134–144)
Total Protein: 5.6 g/dL — ABNORMAL LOW (ref 6.0–8.5)
eGFR: 78 mL/min/{1.73_m2} (ref >59)

## 2024-05-14 LAB — HEMOGLOBIN A1C
Est. average glucose Bld gHb Est-mCnc: 163 mg/dL
Hgb A1c MFr Bld: 7.3 % — ABNORMAL HIGH (ref 4.8–5.6)

## 2024-05-14 LAB — TSH: TSH: 0.563 u[IU]/mL (ref 0.450–4.500)

## 2024-05-16 ENCOUNTER — Encounter: Payer: Self-pay | Admitting: Cardiology

## 2024-05-16 ENCOUNTER — Ambulatory Visit: Admitting: Cardiology

## 2024-05-16 ENCOUNTER — Ambulatory Visit: Payer: Self-pay | Admitting: Cardiology

## 2024-05-16 ENCOUNTER — Telehealth: Payer: Self-pay | Admitting: Plastic Surgery

## 2024-05-16 VITALS — BP 122/80 | HR 90 | Ht 68.0 in | Wt 228.0 lb

## 2024-05-16 DIAGNOSIS — Z1211 Encounter for screening for malignant neoplasm of colon: Secondary | ICD-10-CM

## 2024-05-16 DIAGNOSIS — E782 Mixed hyperlipidemia: Secondary | ICD-10-CM | POA: Diagnosis not present

## 2024-05-16 DIAGNOSIS — E119 Type 2 diabetes mellitus without complications: Secondary | ICD-10-CM

## 2024-05-16 DIAGNOSIS — Z794 Long term (current) use of insulin: Secondary | ICD-10-CM | POA: Diagnosis not present

## 2024-05-16 DIAGNOSIS — E6609 Other obesity due to excess calories: Secondary | ICD-10-CM | POA: Insufficient documentation

## 2024-05-16 DIAGNOSIS — Z6834 Body mass index (BMI) 34.0-34.9, adult: Secondary | ICD-10-CM | POA: Diagnosis not present

## 2024-05-16 DIAGNOSIS — Z0130 Encounter for examination of blood pressure without abnormal findings: Secondary | ICD-10-CM

## 2024-05-16 DIAGNOSIS — E66811 Obesity, class 1: Secondary | ICD-10-CM | POA: Diagnosis not present

## 2024-05-16 MED ORDER — MOUNJARO 5 MG/0.5ML ~~LOC~~ SOAJ: 5.0000 mg | SUBCUTANEOUS | 6 refills | Status: DC

## 2024-05-16 NOTE — Telephone Encounter (Signed)
 Crystal Gibson sch this with Crystal Gibson as a follow up, this pt needed to be with Crystal Gibson. I called pt and we moved pt to Crystal Gibson and pt agreed

## 2024-05-16 NOTE — Progress Notes (Signed)
 Established Patient Office Visit  Subjective:  Patient ID: Crystal Gibson, female    DOB: March 15, 1971  Age: 53 y.o. MRN: [id number]  Chief Complaint  Patient presents with   Follow-up    3 Months Follow Up Lab Results    Patient in office for 3 month follow , discuss recent lab results. Patient doing well, no new complaints today.  Patient due for colon cancer screening, will send order for Cologuard. Patient previously worked  up for a plastic surgery for removal of excess abdominal folds. Patient was denneid surgery at that time due elevated Hgb A1c. Was recommended A1c be less than 7.5, most recent A1c was 7.3. Will refer back to plastic surgery.  Patient has multiple moles on her face she is concerned with, dermatology recommends she be evaluated by plastic surgery due to them being on her face.  Patient taking and tolerating Mounjaro, will increase dose to 5 mg weekly.     No other concerns at this time.   Past Medical History:  Diagnosis Date   Anxiety    Asthma    Bacterial conjunctivitis of right eye 03/16/2023   COVID 2021   Hospitalized with long Covid   Depression    Environmental allergies    Gestational diabetes    Pt is a type 2 diabetes   History of blood clots    when hospitalized for Salmonella poisoning and had Covid at the same time   Lumbar sprain 11/03/2018   Pericarditis 2021   while in hospital for Salmonella poisoning   Sesamoiditis of foot 11/05/2020   Sleep apnea    does not use a cpap    Past Surgical History:  Procedure Laterality Date   ABDOMINAL HYSTERECTOMY     OOPHORECTOMY     WISDOM TOOTH EXTRACTION      Social History   Socioeconomic History   Marital status: Single    Spouse name: Not on file   Number of children: Not on file   Years of education: Not on file   Highest education level: Not on file  Occupational History   Not on file  Tobacco Use   Smoking status: Never   Smokeless tobacco: Never  Vaping Use   Vaping  status: Never Used  Substance and Sexual Activity   Alcohol use: No   Drug use: No   Sexual activity: Yes    Birth control/protection: Surgical  Other Topics Concern   Not on file  Social History Narrative   Not on file   Social Drivers of Health   Financial Resource Strain: High Risk (08/06/2022)   Overall Financial Resource Strain (CARDIA)    Difficulty of Paying Living Expenses: Hard  Food Insecurity: Food Insecurity Present (08/06/2022)   Hunger Vital Sign    Worried About Running Out of Food in the Last Year: Sometimes true    Ran Out of Food in the Last Year: Sometimes true  Transportation Needs: No Transportation Needs (10/13/2022)   PRAPARE - Administrator, Civil Service (Medical): No    Lack of Transportation (Non-Medical): No  Physical Activity: Inactive (09/08/2022)   Exercise Vital Sign    Days of Exercise per Week: 0 days    Minutes of Exercise per Session: 0 min  Stress: Stress Concern Present (06/24/2022)   [employer] of Occupational Health - Occupational Stress Questionnaire    Feeling of Stress : To some extent  Social Connections: Socially Isolated (07/30/2022)   Social Connection and Isolation  Panel [NHANES]    Frequency of Communication with Friends and Family: More than three times a week    Frequency of Social Gatherings with Friends and Family: More than three times a week    Attends Religious Services: Never    Database administrator or Organizations: No    Attends Banker Meetings: Never    Marital Status: Never married  Intimate Partner Violence: Not At Risk (10/13/2022)   Humiliation, Afraid, Rape, and Kick questionnaire    Fear of Current or Ex-Partner: No    Emotionally Abused: No    Physically Abused: No    Sexually Abused: No    Family History  Problem Relation Age of Onset   Breast cancer Sister 77   Breast cancer Maternal Aunt    Breast cancer Paternal Aunt    Breast cancer Maternal Grandmother    Breast  cancer Cousin     Allergies  Allergen Reactions   Glucophage [Metformin] Nausea And Vomiting    Outpatient Medications Prior to Visit  Medication Sig   ADVAIR  HFA 115-21 MCG/ACT inhaler SMARTSIG:2 Puff(s) By Mouth Twice Daily   ARIPiprazole  (ABILIFY ) 15 MG tablet Take 1 tablet (15 mg total) by mouth daily.   baclofen (LIORESAL) 10 MG tablet Take 10 mg by mouth 3 (three) times daily.   Butalbital -APAP-Caffeine  50-300-40 MG CAPS Take 1 capsule by mouth every 6 (six) hours as needed (persistent headache).   dapagliflozin  propanediol (FARXIGA ) 10 MG TABS tablet TAKE 1 TABLET BY MOUTH DAILY FOR DIABETES   digoxin  (LANOXIN ) 0.25 MG tablet Take 1 tablet (250 mcg total) by mouth daily.   DULoxetine  (CYMBALTA ) 60 MG capsule Take 1 capsule (60 mg total) by mouth daily.   EPINEPHrine  0.3 mg/0.3 mL IJ SOAJ injection Inject 0.3 mg into the muscle as needed for anaphylaxis.   Evolocumab  (REPATHA  SURECLICK) 140 MG/ML SOAJ Inject 140 mg into the skin every 14 (fourteen) days.   famotidine  (PEPCID ) 20 MG tablet Take 1 tablet (20 mg total) by mouth daily.   fexofenadine -pseudoephedrine (ALLEGRA-D) 60-120 MG 12 hr tablet Take 1 tablet by mouth 2 (two) times daily.   fluticasone  (FLONASE ) 50 MCG/ACT nasal spray Place 1 spray into both nostrils 2 (two) times daily.   furosemide  (LASIX ) 20 MG tablet Take 1 tablet (20 mg total) by mouth daily.   gabapentin  (NEURONTIN ) 300 MG capsule Take 1 capsule (300 mg total) by mouth 3 (three) times daily.   hydrocortisone  cream 1 % Apply to affected area 2 times daily   icosapent  Ethyl (VASCEPA ) 1 g capsule TAKE 2 CAPSULES(2 GRAMS) BY MOUTH TWICE DAILY   insulin  degludec (TRESIBA  FLEXTOUCH) 200 UNIT/ML FlexTouch Pen Inject 48 Units into the skin daily.   Insulin  Pen Needle (GLOBAL EASY GLIDE PEN NEEDLES) 32G X 4 MM MISC Inject 4 Needles into the skin in the morning, at noon, in the evening, and at bedtime.   ivabradine  (CORLANOR) 7.5 MG TABS tablet Take 1 tablet (7.5 mg  total) by mouth 2 (two) times daily.   losartan  (COZAAR ) 25 MG tablet TAKE 1 TABLET(25 MG) BY MOUTH DAILY   metoprolol  succinate (TOPROL -XL) 100 MG 24 hr tablet Take 2 tablets (200 mg total) by mouth daily.   NOVOLOG  FLEXPEN 100 UNIT/ML FlexPen Inject 10 Units into the skin 3 (three) times daily with meals.   olmesartan  (BENICAR ) 5 MG tablet Take 5 mg by mouth daily.   pantoprazole  (PROTONIX ) 40 MG tablet Take 1 tablet (40 mg total) by mouth 2 (two) times  daily.   potassium chloride  (KLOR-CON  M) 10 MEQ tablet TAKE 1 TABLET BY MOUTH EVERY DAILY WITH FUROSEMIDE  TO PREVENT POTASSIUM DEPLETION   Rimegepant Sulfate (NURTEC) 75 MG TBDP Take 1 tablet (75 mg total) by mouth over 48 hr.   rosuvastatin  (CRESTOR ) 40 MG tablet TAKE 1 TABLET(40 MG) BY MOUTH DAILY   sucralfate  (CARAFATE ) 1 g tablet Take 1 tablet (1 g total) by mouth 4 (four) times daily.   SYMBICORT  160-4.5 MCG/ACT inhaler Inhale 2 puffs into the lungs in the morning and at bedtime.   topiramate  (TOPAMAX ) 50 MG tablet Take 1 tablet (50 mg total) by mouth at bedtime.   [DISCONTINUED] MOUNJARO 2.5 MG/0.5ML Pen Inject 2.5 mg into the skin once a week.   [DISCONTINUED] Semaglutide  (RYBELSUS ) 14 MG TABS Take 1 tablet (14 mg total) by mouth daily.   ACCU-CHEK FASTCLIX LANCETS MISC    BD PEN NEEDLE NANO 2ND GEN 32G X 4 MM MISC USE THREE TIMES DAILY BEFORE MEALS   Blood Glucose Monitoring Suppl (ACCU-CHEK GUIDE) w/Device KIT USE DEVICE TO CHECK SUGARS DAILY   Continuous Glucose Receiver (DEXCOM G7 RECEIVER) DEVI Use with sensor to check continuous glucose   Continuous Glucose Sensor (DEXCOM G7 SENSOR) MISC Apply to skin for continuous glucose monitoring   glucose blood (ACCU-CHEK GUIDE) test strip Accu-Chek Guide In Vitro Strip QTY: 200 strip Days: 90 Refills: 1  Written: 04/27/20 Patient Instructions: TEST FASTING BLOOD SUGAR EVERY MORNING FASTING AND EVERY EVENING E11.65   Lancets Misc. (ACCU-CHEK FASTCLIX LANCET) KIT Accu-Chek Fastclix Lancet  Drum  USE TO CHECK FASTING IN AM AND AS NEEDED FOR HIGH OR LOW   OXYGEN Inhale 2 L into the lungs as needed (exertion).   traZODone (DESYREL) 100 MG tablet Take 200 mg by mouth at bedtime.    No facility-administered medications prior to visit.    Review of Systems  Constitutional: Negative.   HENT: Negative.    Eyes: Negative.   Respiratory: Negative.  Negative for shortness of breath.   Cardiovascular: Negative.  Negative for chest pain.  Gastrointestinal: Negative.  Negative for abdominal pain, constipation and diarrhea.  Genitourinary: Negative.   Musculoskeletal:  Negative for joint pain and myalgias.  Skin: Negative.   Neurological: Negative.  Negative for dizziness and headaches.  Endo/Heme/Allergies: Negative.   All other systems reviewed and are negative.      Objective:   BP 122/80   Pulse 90   Ht 5\' 8"  (1.727 m)   Wt 228 lb (103.4 kg)   SpO2 97%   BMI 34.67 kg/m   Vitals:   05/16/24 0909  BP: 122/80  Pulse: 90  Height: 5\' 8"  (1.727 m)  Weight: 228 lb (103.4 kg)  SpO2: 97%  BMI (Calculated): 34.68    Physical Exam Vitals and nursing note reviewed.  Constitutional:      Appearance: Normal appearance. She is normal weight.  HENT:     Head: Normocephalic and atraumatic.     Nose: Nose normal.     Mouth/Throat:     Mouth: Mucous membranes are moist.  Eyes:     Extraocular Movements: Extraocular movements intact.     Conjunctiva/sclera: Conjunctivae normal.     Pupils: Pupils are equal, round, and reactive to light.  Cardiovascular:     Rate and Rhythm: Normal rate and regular rhythm.     Pulses: Normal pulses.     Heart sounds: Normal heart sounds.  Pulmonary:     Effort: Pulmonary effort is normal.  Breath sounds: Normal breath sounds.  Abdominal:     General: Abdomen is flat. Bowel sounds are normal.     Palpations: Abdomen is soft.  Musculoskeletal:        General: Normal range of motion.     Cervical back: Normal range of motion.   Skin:    General: Skin is warm and dry.  Neurological:     General: No focal deficit present.     Mental Status: She is alert and oriented to person, place, and time.  Psychiatric:        Mood and Affect: Mood normal.        Behavior: Behavior normal.        Thought Content: Thought content normal.        Judgment: Judgment normal.      No results found for any visits on 05/16/24.  Recent Results (from the past 2160 hours)  Hemoglobin A1c     Status: Abnormal   Collection Time: 05/13/24 10:32 AM  Result Value Ref Range   Hgb A1c MFr Bld 7.3 (H) 4.8 - 5.6 %    Comment:          Prediabetes: 5.7 - 6.4          Diabetes: >6.4          Glycemic control for adults with diabetes: <7.0    Est. average glucose Bld gHb Est-mCnc 163 mg/dL  Lipid Profile     Status: Abnormal   Collection Time: 05/13/24 10:32 AM  Result Value Ref Range   Cholesterol, Total 131 100 - 199 mg/dL   Triglycerides 478 (H) 0 - 149 mg/dL   HDL 35 (L) >29 mg/dL   VLDL Cholesterol Cal 34 5 - 40 mg/dL   LDL Chol Calc (NIH) 62 0 - 99 mg/dL   Chol/HDL Ratio 3.7 0.0 - 4.4 ratio    Comment:                                   T. Chol/HDL Ratio                                             Men  Women                               1/2 Avg.Risk  3.4    3.3                                   Avg.Risk  5.0    4.4                                2X Avg.Risk  9.6    7.1                                3X Avg.Risk 23.4   11.0   CMP14+EGFR     Status: Abnormal   Collection Time: 05/13/24 10:32 AM  Result Value Ref Range   Glucose 159 (H) 70 - 99 mg/dL   BUN 8 6 -  24 mg/dL   Creatinine, Ser 1.32 0.57 - 1.00 mg/dL   eGFR 78 >44 WN/UUV/2.53   BUN/Creatinine Ratio 9 9 - 23   Sodium 140 134 - 144 mmol/L   Potassium 4.3 3.5 - 5.2 mmol/L   Chloride 104 96 - 106 mmol/L   CO2 24 20 - 29 mmol/L   Calcium  8.6 (L) 8.7 - 10.2 mg/dL   Total Protein 5.6 (L) 6.0 - 8.5 g/dL   Albumin 3.8 3.8 - 4.9 g/dL   Globulin, Total 1.8 1.5 - 4.5  g/dL   Bilirubin Total 0.5 0.0 - 1.2 mg/dL   Alkaline Phosphatase 58 44 - 121 IU/L   AST 30 0 - 40 IU/L   ALT 44 (H) 0 - 32 IU/L  TSH     Status: None   Collection Time: 05/13/24 10:32 AM  Result Value Ref Range   TSH 0.563 0.450 - 4.500 uIU/mL      Assessment & Plan:  Cologuard order sent. Referral to plastic surgery sent. Increase Mounjaro to 5 mg weekly.   Problem List Items Addressed This Visit       Endocrine   Type 2 diabetes mellitus without complication, with long-term current use of insulin  (HCC) - Primary   Relevant Medications   olmesartan  (BENICAR ) 5 MG tablet   tirzepatide (MOUNJARO) 5 MG/0.5ML Pen     Other   Mixed hyperlipidemia   Relevant Medications   olmesartan  (BENICAR ) 5 MG tablet   Class 1 obesity due to excess calories with serious comorbidity and body mass index (BMI) of 34.0 to 34.9 in adult   Relevant Medications   tirzepatide [facility]) 5 MG/0.5ML Pen   Other Relevant Orders   Ambulatory referral to Plastic Surgery   Other Visit Diagnoses       Colon cancer screening       Relevant Orders   Cologuard       Return in about 4 months (around 09/16/2024) for fasting labs prior.   Total time spent: 25 minutes  [employer], NP  05/16/2024   This document may have been prepared by Dragon Voice Recognition software and as such may include unintentional dictation errors.

## 2024-05-18 ENCOUNTER — Ambulatory Visit (INDEPENDENT_AMBULATORY_CARE_PROVIDER_SITE_OTHER): Admitting: Plastic Surgery

## 2024-05-18 DIAGNOSIS — M793 Panniculitis, unspecified: Secondary | ICD-10-CM

## 2024-05-18 NOTE — Progress Notes (Signed)
 Called Crystal Gibson to discuss her latest hemoglobin A1c results.  Her last result on May 13, 2024 was 7.3.  I let her know that this is still higher than I will normally except to move forward with surgery.  Except to move forward with surgery.  For nonurgent and noncancer cases I require a hemoglobin A1c of 6.3 or less.  I told Crystal Gibson that this is because of the extraordinarily high complication rate with panniculectomy is in the most optimal situations.  Crystal Gibson became very agitated and told me that she did not understand and that if I did not want to operate on her that I should just say so.  I tried to explain that this had nothing to do with wanting to operate on her not that I was simply trying to make the surgery as safe as possible as she is is already a very high risk patient for panniculectomy.  I asked her to repeat a comment because I could not understand what she said and she yelled at me and promptly hung up.  I have informed the office management that I consider that to be a break of the doctor-patient relationship and Crystal Gibson should seek care for her issue with another physician.

## 2024-05-24 ENCOUNTER — Other Ambulatory Visit: Payer: Self-pay | Admitting: Cardiovascular Disease

## 2024-05-24 DIAGNOSIS — I2694 Multiple subsegmental pulmonary emboli without acute cor pulmonale: Secondary | ICD-10-CM

## 2024-05-24 DIAGNOSIS — I10 Essential (primary) hypertension: Secondary | ICD-10-CM

## 2024-05-24 DIAGNOSIS — R55 Syncope and collapse: Secondary | ICD-10-CM

## 2024-05-24 DIAGNOSIS — Z1231 Encounter for screening mammogram for malignant neoplasm of breast: Secondary | ICD-10-CM

## 2024-05-24 DIAGNOSIS — J9611 Chronic respiratory failure with hypoxia: Secondary | ICD-10-CM

## 2024-05-24 DIAGNOSIS — R0789 Other chest pain: Secondary | ICD-10-CM

## 2024-05-24 DIAGNOSIS — G4733 Obstructive sleep apnea (adult) (pediatric): Secondary | ICD-10-CM

## 2024-05-24 DIAGNOSIS — E782 Mixed hyperlipidemia: Secondary | ICD-10-CM

## 2024-05-24 DIAGNOSIS — I4711 Inappropriate sinus tachycardia, so stated: Secondary | ICD-10-CM

## 2024-05-24 DIAGNOSIS — I5189 Other ill-defined heart diseases: Secondary | ICD-10-CM

## 2024-05-27 ENCOUNTER — Ambulatory Visit: Admitting: Plastic Surgery

## 2024-05-28 DIAGNOSIS — Z1211 Encounter for screening for malignant neoplasm of colon: Secondary | ICD-10-CM | POA: Diagnosis not present

## 2024-06-03 ENCOUNTER — Other Ambulatory Visit: Payer: Self-pay | Admitting: Cardiology

## 2024-06-03 ENCOUNTER — Ambulatory Visit: Payer: Self-pay | Admitting: Cardiology

## 2024-06-03 DIAGNOSIS — Z1211 Encounter for screening for malignant neoplasm of colon: Secondary | ICD-10-CM

## 2024-06-03 DIAGNOSIS — R195 Other fecal abnormalities: Secondary | ICD-10-CM

## 2024-06-03 LAB — COLOGUARD: COLOGUARD: POSITIVE — AB

## 2024-06-09 ENCOUNTER — Other Ambulatory Visit: Payer: Self-pay | Admitting: Cardiology

## 2024-06-09 DIAGNOSIS — R195 Other fecal abnormalities: Secondary | ICD-10-CM

## 2024-06-13 ENCOUNTER — Other Ambulatory Visit: Payer: Self-pay

## 2024-06-13 ENCOUNTER — Telehealth: Payer: Self-pay

## 2024-06-13 DIAGNOSIS — R195 Other fecal abnormalities: Secondary | ICD-10-CM

## 2024-06-13 DIAGNOSIS — Z1211 Encounter for screening for malignant neoplasm of colon: Secondary | ICD-10-CM

## 2024-06-13 MED ORDER — NA SULFATE-K SULFATE-MG SULF 17.5-3.13-1.6 GM/177ML PO SOLN: 1.0000 | Freq: Once | ORAL | 0 refills | Status: AC

## 2024-06-13 NOTE — Telephone Encounter (Signed)
 Gastroenterology Pre-Procedure Review  Request Date: 08/04/24 Requesting Physician: Dr. Ole Berkeley  PATIENT REVIEW QUESTIONS: The patient responded to the following health history questions as indicated:    1. Are you having any GI issues? Positive cologuard. 1st colonoscopy 2. Do you have a personal history of Polyps? no 3. Do you have a family history of Colon Cancer or Polyps? no 4. Diabetes Mellitus? yes (takes Mounjaro , Farxiga , and Tresiba  has been advised to stop per protocol and noted on instructions) 5. Joint replacements in the past 12 months?no 6. Major health problems in the past 3 months?no 7. Any artificial heart valves, MVP, or defibrillator?No devices however does have cardiac issues.  Clearance sent to Dr. Debborah Fairly    MEDICATIONS & ALLERGIES:    Patient reports the following regarding taking any anticoagulation/antiplatelet therapy:   Plavix, Coumadin, Eliquis, Xarelto, Lovenox, Pradaxa, Brilinta, or Effient? no Aspirin? no  Patient confirms/reports the following medications:  Current Outpatient Medications  Medication Sig Dispense Refill   ACCU-CHEK FASTCLIX LANCETS MISC   2   ADVAIR  HFA 115-21 MCG/ACT inhaler SMARTSIG:2 Puff(s) By Mouth Twice Daily     ARIPiprazole  (ABILIFY ) 15 MG tablet Take 1 tablet (15 mg total) by mouth daily. 30 tablet 7   baclofen (LIORESAL) 10 MG tablet Take 10 mg by mouth 3 (three) times daily.     BD PEN NEEDLE NANO 2ND GEN 32G X 4 MM MISC USE THREE TIMES DAILY BEFORE MEALS 100 each 3   Blood Glucose Monitoring Suppl (ACCU-CHEK GUIDE) w/Device KIT USE DEVICE TO CHECK SUGARS DAILY     Butalbital -APAP-Caffeine  50-300-40 MG CAPS Take 1 capsule by mouth every 6 (six) hours as needed (persistent headache). 120 capsule 1   Continuous Glucose Receiver (DEXCOM G7 RECEIVER) DEVI Use with sensor to check continuous glucose 1 each 0   Continuous Glucose Sensor (DEXCOM G7 SENSOR) MISC Apply to skin for continuous glucose monitoring 2 each 11    dapagliflozin  propanediol (FARXIGA ) 10 MG TABS tablet TAKE 1 TABLET BY MOUTH DAILY FOR DIABETES 90 tablet 1   digoxin  (LANOXIN ) 0.25 MG tablet Take 1 tablet (250 mcg total) by mouth daily. 90 tablet 1   DULoxetine  (CYMBALTA ) 60 MG capsule Take 1 capsule (60 mg total) by mouth daily. 90 capsule 3   EPINEPHrine  0.3 mg/0.3 mL IJ SOAJ injection Inject 0.3 mg into the muscle as needed for anaphylaxis. 1 each 6   Evolocumab  (REPATHA  SURECLICK) 140 MG/ML SOAJ Inject 140 mg into the skin every 14 (fourteen) days. 2 mL 6   famotidine  (PEPCID ) 20 MG tablet Take 1 tablet (20 mg total) by mouth daily. 90 tablet 1   fexofenadine -pseudoephedrine (ALLEGRA-D) 60-120 MG 12 hr tablet Take 1 tablet by mouth 2 (two) times daily. 30 tablet 11   fluticasone  (FLONASE ) 50 MCG/ACT nasal spray Place 1 spray into both nostrils 2 (two) times daily. 16 g 0   furosemide  (LASIX ) 20 MG tablet Take 1 tablet (20 mg total) by mouth daily. 90 tablet 1   gabapentin  (NEURONTIN ) 300 MG capsule Take 1 capsule (300 mg total) by mouth 3 (three) times daily. 90 capsule 3   glucose blood (ACCU-CHEK GUIDE) test strip Accu-Chek Guide In Vitro Strip QTY: 200 strip Days: 90 Refills: 1  Written: 04/27/20 Patient Instructions: TEST FASTING BLOOD SUGAR EVERY MORNING FASTING AND EVERY EVENING E11.65     hydrocortisone  cream 1 % Apply to affected area 2 times daily 30 g 1   icosapent  Ethyl (VASCEPA ) 1 g capsule TAKE 2 CAPSULES(2 GRAMS) BY  MOUTH TWICE DAILY 120 capsule 1   insulin  degludec (TRESIBA  FLEXTOUCH) 200 UNIT/ML FlexTouch Pen Inject 48 Units into the skin daily. 9 mL 3   Insulin  Pen Needle (GLOBAL EASY GLIDE PEN NEEDLES) 32G X 4 MM MISC Inject 4 Needles into the skin in the morning, at noon, in the evening, and at bedtime. 100 each 5   ivabradine  (CORLANOR) 7.5 MG TABS tablet Take 1 tablet (7.5 mg total) by mouth 2 (two) times daily. 180 tablet 1   Lancets Misc. (ACCU-CHEK FASTCLIX LANCET) KIT Accu-Chek Fastclix Lancet Drum  USE TO CHECK  FASTING IN AM AND AS NEEDED FOR HIGH OR LOW     losartan  (COZAAR ) 25 MG tablet TAKE 1 TABLET(25 MG) BY MOUTH DAILY 90 tablet 1   metoprolol  succinate (TOPROL -XL) 100 MG 24 hr tablet Take 2 tablets (200 mg total) by mouth daily. 90 tablet 1   NOVOLOG  FLEXPEN 100 UNIT/ML FlexPen Inject 10 Units into the skin 3 (three) times daily with meals. 15 mL 11   olmesartan  (BENICAR ) 5 MG tablet Take 5 mg by mouth daily.     OXYGEN Inhale 2 L into the lungs as needed (exertion).     pantoprazole  (PROTONIX ) 40 MG tablet Take 1 tablet (40 mg total) by mouth 2 (two) times daily. 180 tablet 3   potassium chloride  (KLOR-CON  M) 10 MEQ tablet TAKE 1 TABLET BY MOUTH EVERY DAILY WITH FUROSEMIDE  TO PREVENT POTASSIUM DEPLETION 30 tablet 11   Rimegepant Sulfate (NURTEC) 75 MG TBDP Take 1 tablet (75 mg total) by mouth over 48 hr. 12 tablet 3   rosuvastatin  (CRESTOR ) 40 MG tablet TAKE 1 TABLET(40 MG) BY MOUTH DAILY 90 tablet 1   sucralfate  (CARAFATE ) 1 g tablet Take 1 tablet (1 g total) by mouth 4 (four) times daily. 120 tablet 1   SYMBICORT  160-4.5 MCG/ACT inhaler Inhale 2 puffs into the lungs in the morning and at bedtime. 1 each 11   tirzepatide  (MOUNJARO ) 5 MG/0.5ML Pen Inject 5 mg into the skin once a week. 0.5 mL 6   topiramate  (TOPAMAX ) 50 MG tablet Take 1 tablet (50 mg total) by mouth at bedtime. 30 tablet 6   traZODone (DESYREL) 100 MG tablet Take 200 mg by mouth at bedtime.      No current facility-administered medications for this visit.    Patient confirms/reports the following allergies:  Allergies  Allergen Reactions   Glucophage [Metformin] Nausea And Vomiting    No orders of the defined types were placed in this encounter.   AUTHORIZATION INFORMATION Primary Insurance: 1D#: Group #:  Secondary Insurance: 1D#: Group #:  SCHEDULE INFORMATION: Date: 08/04/24 Time: Location: [facility]

## 2024-06-20 ENCOUNTER — Telehealth: Payer: Self-pay

## 2024-06-20 NOTE — Telephone Encounter (Signed)
 Received medical clearance from Dr.Shaukat Fernand -patient is cleared to have procedure.

## 2024-06-21 NOTE — Telephone Encounter (Signed)
 Trudy Leeanna RAMAN, CMA    06/20/24  8:55 AM Note Received medical clearance from Dr.Shaukat Fernand -patient is cleared to have procedure.

## 2024-06-29 ENCOUNTER — Telehealth: Payer: Self-pay

## 2024-06-29 NOTE — Telephone Encounter (Signed)
 Spoke with Jessie at Dr.Khan's office to check status on Clearance-

## 2024-07-04 ENCOUNTER — Encounter: Payer: Self-pay | Admitting: Cardiovascular Disease

## 2024-07-04 ENCOUNTER — Ambulatory Visit: Admitting: Cardiovascular Disease

## 2024-07-04 VITALS — BP 122/78 | HR 102 | Ht 68.0 in | Wt 229.8 lb

## 2024-07-04 DIAGNOSIS — R0789 Other chest pain: Secondary | ICD-10-CM

## 2024-07-04 DIAGNOSIS — I10 Essential (primary) hypertension: Secondary | ICD-10-CM

## 2024-07-04 DIAGNOSIS — I4711 Inappropriate sinus tachycardia, so stated: Secondary | ICD-10-CM

## 2024-07-04 DIAGNOSIS — R55 Syncope and collapse: Secondary | ICD-10-CM | POA: Diagnosis not present

## 2024-07-04 DIAGNOSIS — E782 Mixed hyperlipidemia: Secondary | ICD-10-CM

## 2024-07-04 NOTE — Progress Notes (Signed)
 Cardiology Office Note   Date:  07/04/2024   ID:  Crystal Gibson, DOB [birth year], MRN [id number]  PCP:  Carin Gauze, NP  Cardiologist:  Denyse Bathe, MD      History of Present Illness: Crystal Gibson is a 53 y.o. female who presents for  Chief Complaint  Patient presents with   Follow-up    3 month follow up. Chest pain has gotten worse since Friday    Having chest pains in office started Friday.      Past Medical History:  Diagnosis Date   Anxiety    Asthma    Bacterial conjunctivitis of right eye 03/16/2023   COVID 2021   Hospitalized with long Covid   Depression    Environmental allergies    Gestational diabetes    Pt is a type 2 diabetes   History of blood clots    when hospitalized for Salmonella poisoning and had Covid at the same time   Lumbar sprain 11/03/2018   Pericarditis 2021   while in hospital for Salmonella poisoning   Sesamoiditis of foot 11/05/2020   Sleep apnea    does not use a cpap     Past Surgical History:  Procedure Laterality Date   ABDOMINAL HYSTERECTOMY     OOPHORECTOMY     WISDOM TOOTH EXTRACTION       Current Outpatient Medications  Medication Sig Dispense Refill   ACCU-CHEK FASTCLIX LANCETS MISC   2   ADVAIR  HFA 115-21 MCG/ACT inhaler SMARTSIG:2 Puff(s) By Mouth Twice Daily     ARIPiprazole  (ABILIFY ) 15 MG tablet Take 1 tablet (15 mg total) by mouth daily. 30 tablet 7   baclofen (LIORESAL) 10 MG tablet Take 10 mg by mouth 3 (three) times daily.     BD PEN NEEDLE NANO 2ND GEN 32G X 4 MM MISC USE THREE TIMES DAILY BEFORE MEALS 100 each 3   Blood Glucose Monitoring Suppl (ACCU-CHEK GUIDE) w/Device KIT USE DEVICE TO CHECK SUGARS DAILY     Butalbital -APAP-Caffeine  50-300-40 MG CAPS Take 1 capsule by mouth every 6 (six) hours as needed (persistent headache). 120 capsule 1   Continuous Glucose Receiver (DEXCOM G7 RECEIVER) DEVI Use with sensor to check continuous glucose 1 each 0   Continuous Glucose Sensor (DEXCOM G7  SENSOR) MISC Apply to skin for continuous glucose monitoring 2 each 11   dapagliflozin  propanediol (FARXIGA ) 10 MG TABS tablet TAKE 1 TABLET BY MOUTH DAILY FOR DIABETES 90 tablet 1   digoxin  (LANOXIN ) 0.25 MG tablet Take 1 tablet (250 mcg total) by mouth daily. 90 tablet 1   DULoxetine  (CYMBALTA ) 60 MG capsule Take 1 capsule (60 mg total) by mouth daily. 90 capsule 3   EPINEPHrine  0.3 mg/0.3 mL IJ SOAJ injection Inject 0.3 mg into the muscle as needed for anaphylaxis. 1 each 6   Evolocumab  (REPATHA  SURECLICK) 140 MG/ML SOAJ Inject 140 mg into the skin every 14 (fourteen) days. 2 mL 6   famotidine  (PEPCID ) 20 MG tablet Take 1 tablet (20 mg total) by mouth daily. 90 tablet 1   fexofenadine -pseudoephedrine (ALLEGRA-D) 60-120 MG 12 hr tablet Take 1 tablet by mouth 2 (two) times daily. 30 tablet 11   fluticasone  (FLONASE ) 50 MCG/ACT nasal spray Place 1 spray into both nostrils 2 (two) times daily. 16 g 0   furosemide  (LASIX ) 20 MG tablet Take 1 tablet (20 mg total) by mouth daily. 90 tablet 1   gabapentin  (NEURONTIN ) 300 MG capsule Take 1 capsule (300 mg total) by  mouth 3 (three) times daily. 90 capsule 3   insulin  degludec (TRESIBA  FLEXTOUCH) 200 UNIT/ML FlexTouch Pen Inject 48 Units into the skin daily. 9 mL 3   Insulin  Pen Needle (GLOBAL EASY GLIDE PEN NEEDLES) 32G X 4 MM MISC Inject 4 Needles into the skin in the morning, at noon, in the evening, and at bedtime. 100 each 5   Lancets Misc. (ACCU-CHEK FASTCLIX LANCET) KIT Accu-Chek Fastclix Lancet Drum  USE TO CHECK FASTING IN AM AND AS NEEDED FOR HIGH OR LOW     losartan  (COZAAR ) 25 MG tablet TAKE 1 TABLET(25 MG) BY MOUTH DAILY 90 tablet 1   metoprolol  succinate (TOPROL -XL) 100 MG 24 hr tablet Take 2 tablets (200 mg total) by mouth daily. 90 tablet 1   NOVOLOG  FLEXPEN 100 UNIT/ML FlexPen Inject 10 Units into the skin 3 (three) times daily with meals. 15 mL 11   olmesartan  (BENICAR ) 5 MG tablet Take 5 mg by mouth daily.     OXYGEN Inhale 2 L into the  lungs as needed (exertion).     pantoprazole  (PROTONIX ) 40 MG tablet Take 1 tablet (40 mg total) by mouth 2 (two) times daily. 180 tablet 3   potassium chloride  (KLOR-CON  M) 10 MEQ tablet TAKE 1 TABLET BY MOUTH EVERY DAILY WITH FUROSEMIDE  TO PREVENT POTASSIUM DEPLETION 30 tablet 11   Rimegepant Sulfate (NURTEC) 75 MG TBDP Take 1 tablet (75 mg total) by mouth over 48 hr. 12 tablet 3   rosuvastatin  (CRESTOR ) 40 MG tablet TAKE 1 TABLET(40 MG) BY MOUTH DAILY 90 tablet 1   sucralfate  (CARAFATE ) 1 g tablet Take 1 tablet (1 g total) by mouth 4 (four) times daily. 120 tablet 1   SYMBICORT  160-4.5 MCG/ACT inhaler Inhale 2 puffs into the lungs in the morning and at bedtime. 1 each 11   tirzepatide  (MOUNJARO ) 5 MG/0.5ML Pen Inject 5 mg into the skin once a week. 0.5 mL 6   topiramate  (TOPAMAX ) 50 MG tablet Take 1 tablet (50 mg total) by mouth at bedtime. 30 tablet 6   traZODone (DESYREL) 100 MG tablet Take 200 mg by mouth at bedtime.      glucose blood (ACCU-CHEK GUIDE) test strip Accu-Chek Guide In Vitro Strip QTY: 200 strip Days: 90 Refills: 1  Written: 04/27/20 Patient Instructions: TEST FASTING BLOOD SUGAR EVERY MORNING FASTING AND EVERY EVENING E11.65     hydrocortisone  cream 1 % Apply to affected area 2 times daily (Patient not taking: Reported on 07/04/2024) 30 g 1   icosapent  Ethyl (VASCEPA ) 1 g capsule TAKE 2 CAPSULES(2 GRAMS) BY MOUTH TWICE DAILY (Patient not taking: Reported on 07/04/2024) 120 capsule 1   ivabradine  (CORLANOR) 7.5 MG TABS tablet Take 1 tablet (7.5 mg total) by mouth 2 (two) times daily. 180 tablet 1   No current facility-administered medications for this visit.    Allergies:   Glucophage [metformin]    Social History:   reports that she has never smoked. She has never used smokeless tobacco. She reports that she does not drink alcohol and does not use drugs.   Family History:  family history includes Breast cancer in her cousin, maternal aunt, maternal grandmother, and paternal  aunt; Breast cancer (age of onset: 16) in her sister.    ROS:     Review of Systems  Constitutional: Negative.   HENT: Negative.    Eyes: Negative.   Respiratory: Negative.    Gastrointestinal: Negative.   Genitourinary: Negative.   Musculoskeletal: Negative.   Skin: Negative.  Neurological: Negative.   Endo/Heme/Allergies: Negative.   Psychiatric/Behavioral: Negative.    All other systems reviewed and are negative.     All other systems are reviewed and negative.    PHYSICAL EXAM: VS:  BP 122/78   Pulse (!) 102   Ht 5' 8 (1.727 m)   Wt 229 lb 12.8 oz (104.2 kg)   SpO2 98%   BMI 34.94 kg/m  , BMI Body mass index is 34.94 kg/m. Last weight:  Wt Readings from Last 3 Encounters:  07/04/24 229 lb 12.8 oz (104.2 kg)  05/16/24 228 lb (103.4 kg)  04/11/24 230 lb (104.3 kg)     Physical Exam Constitutional:      Appearance: Normal appearance.  Cardiovascular:     Rate and Rhythm: Normal rate and regular rhythm.     Heart sounds: Normal heart sounds.  Pulmonary:     Effort: Pulmonary effort is normal.     Breath sounds: Normal breath sounds.  Musculoskeletal:     Right lower leg: No edema.     Left lower leg: No edema.  Neurological:     Mental Status: She is alert.       EKG:   Recent Labs: 05/13/2024: ALT 44; BUN 8; Creatinine, Ser 0.89; Potassium 4.3; Sodium 140; TSH 0.563    Lipid Panel    Component Value Date/Time   CHOL 131 05/13/2024 1032   TRIG 208 (H) 05/13/2024 1032   HDL 35 (L) 05/13/2024 1032   CHOLHDL 3.7 05/13/2024 1032   LDLCALC 62 05/13/2024 1032      Other studies Reviewed: Additional studies/ records that were reviewed today include:  Review of the above records demonstrates:       No data to display            ASSESSMENT AND PLAN:    ICD-10-CM   1. Other chest pain  R07.89 PCV ECHOCARDIOGRAM COMPLETE    MYOCARDIAL PERFUSION IMAGING   set up stress test and echo    2. Inappropriate sinus tachycardia (HCC)  I47.11  PCV ECHOCARDIOGRAM COMPLETE    MYOCARDIAL PERFUSION IMAGING    3. Mixed hyperlipidemia  E78.2 PCV ECHOCARDIOGRAM COMPLETE    MYOCARDIAL PERFUSION IMAGING    4. Essential hypertension  I10 PCV ECHOCARDIOGRAM COMPLETE    MYOCARDIAL PERFUSION IMAGING    5. Syncope, unspecified syncope type  R55 PCV ECHOCARDIOGRAM COMPLETE    MYOCARDIAL PERFUSION IMAGING       Problem List Items Addressed This Visit       Cardiovascular and Mediastinum   Inappropriate sinus tachycardia (HCC)   Relevant Orders   PCV ECHOCARDIOGRAM COMPLETE   MYOCARDIAL PERFUSION IMAGING     Other   Mixed hyperlipidemia   Relevant Orders   PCV ECHOCARDIOGRAM COMPLETE   MYOCARDIAL PERFUSION IMAGING   Other Visit Diagnoses       Other chest pain    -  Primary   set up stress test and echo   Relevant Orders   PCV ECHOCARDIOGRAM COMPLETE   MYOCARDIAL PERFUSION IMAGING     Essential hypertension       Relevant Orders   PCV ECHOCARDIOGRAM COMPLETE   MYOCARDIAL PERFUSION IMAGING     Syncope, unspecified syncope type       Relevant Orders   PCV ECHOCARDIOGRAM COMPLETE   MYOCARDIAL PERFUSION IMAGING          Disposition:   Return in about 2 weeks (around 07/18/2024) for echo, stress test and f/u.    Total time  spent: 35 minutes  Signed,  Denyse Bathe, MD  07/04/2024 10:27 AM    [facility]

## 2024-07-08 ENCOUNTER — Ambulatory Visit: Admitting: Cardiovascular Disease

## 2024-07-12 DIAGNOSIS — I10 Essential (primary) hypertension: Secondary | ICD-10-CM | POA: Diagnosis not present

## 2024-07-12 DIAGNOSIS — I2510 Atherosclerotic heart disease of native coronary artery without angina pectoris: Secondary | ICD-10-CM | POA: Diagnosis not present

## 2024-07-12 DIAGNOSIS — E119 Type 2 diabetes mellitus without complications: Secondary | ICD-10-CM | POA: Diagnosis not present

## 2024-07-12 DIAGNOSIS — J4520 Mild intermittent asthma, uncomplicated: Secondary | ICD-10-CM | POA: Diagnosis not present

## 2024-07-12 DIAGNOSIS — K219 Gastro-esophageal reflux disease without esophagitis: Secondary | ICD-10-CM | POA: Diagnosis not present

## 2024-07-12 DIAGNOSIS — E876 Hypokalemia: Secondary | ICD-10-CM | POA: Diagnosis not present

## 2024-07-12 DIAGNOSIS — I4891 Unspecified atrial fibrillation: Secondary | ICD-10-CM | POA: Diagnosis not present

## 2024-07-13 ENCOUNTER — Other Ambulatory Visit: Payer: Self-pay | Admitting: Family

## 2024-07-13 ENCOUNTER — Other Ambulatory Visit: Payer: Self-pay | Admitting: Cardiovascular Disease

## 2024-07-20 ENCOUNTER — Ambulatory Visit

## 2024-07-20 DIAGNOSIS — I4711 Inappropriate sinus tachycardia, so stated: Secondary | ICD-10-CM

## 2024-07-20 DIAGNOSIS — R0789 Other chest pain: Secondary | ICD-10-CM

## 2024-07-20 DIAGNOSIS — I361 Nonrheumatic tricuspid (valve) insufficiency: Secondary | ICD-10-CM | POA: Diagnosis not present

## 2024-07-20 DIAGNOSIS — I340 Nonrheumatic mitral (valve) insufficiency: Secondary | ICD-10-CM

## 2024-07-20 DIAGNOSIS — E782 Mixed hyperlipidemia: Secondary | ICD-10-CM

## 2024-07-20 DIAGNOSIS — R55 Syncope and collapse: Secondary | ICD-10-CM

## 2024-07-20 DIAGNOSIS — I10 Essential (primary) hypertension: Secondary | ICD-10-CM

## 2024-07-20 DIAGNOSIS — I371 Nonrheumatic pulmonary valve insufficiency: Secondary | ICD-10-CM | POA: Diagnosis not present

## 2024-07-25 ENCOUNTER — Ambulatory Visit

## 2024-08-02 ENCOUNTER — Telehealth: Payer: Self-pay

## 2024-08-02 ENCOUNTER — Ambulatory Visit: Admitting: Cardiovascular Disease

## 2024-08-02 NOTE — Telephone Encounter (Signed)
Pt informed

## 2024-08-02 NOTE — Telephone Encounter (Signed)
 Pt left VM requesting her Colonoscopy prep to be sent to [facility] as it was not in yet. Please let pt know she needs to ask her GI doctor for this not us .

## 2024-08-04 ENCOUNTER — Ambulatory Visit
Admission: RE | Admit: 2024-08-04 | Discharge: 2024-08-04 | Disposition: A | Discharge status: Home / Self Care | Attending: Gastroenterology | Admitting: Gastroenterology

## 2024-08-04 ENCOUNTER — Encounter
Admission: RE | Disposition: A | Discharge status: Home / Self Care | Payer: Self-pay | Source: Home / Self Care | Attending: Gastroenterology

## 2024-08-04 ENCOUNTER — Ambulatory Visit: Admitting: Certified Registered"

## 2024-08-04 ENCOUNTER — Encounter: Payer: Self-pay | Admitting: Gastroenterology

## 2024-08-04 DIAGNOSIS — E66813 Obesity, class 3: Secondary | ICD-10-CM | POA: Insufficient documentation

## 2024-08-04 DIAGNOSIS — Z1211 Encounter for screening for malignant neoplasm of colon: Secondary | ICD-10-CM | POA: Insufficient documentation

## 2024-08-04 DIAGNOSIS — D123 Benign neoplasm of transverse colon: Secondary | ICD-10-CM | POA: Insufficient documentation

## 2024-08-04 DIAGNOSIS — F419 Anxiety disorder, unspecified: Secondary | ICD-10-CM | POA: Diagnosis not present

## 2024-08-04 DIAGNOSIS — Z794 Long term (current) use of insulin: Secondary | ICD-10-CM | POA: Diagnosis not present

## 2024-08-04 DIAGNOSIS — Z7985 Long-term (current) use of injectable non-insulin antidiabetic drugs: Secondary | ICD-10-CM | POA: Diagnosis not present

## 2024-08-04 DIAGNOSIS — D120 Benign neoplasm of cecum: Secondary | ICD-10-CM | POA: Insufficient documentation

## 2024-08-04 DIAGNOSIS — E119 Type 2 diabetes mellitus without complications: Secondary | ICD-10-CM | POA: Insufficient documentation

## 2024-08-04 DIAGNOSIS — G4730 Sleep apnea, unspecified: Secondary | ICD-10-CM | POA: Insufficient documentation

## 2024-08-04 DIAGNOSIS — Z7984 Long term (current) use of oral hypoglycemic drugs: Secondary | ICD-10-CM | POA: Insufficient documentation

## 2024-08-04 DIAGNOSIS — Z6834 Body mass index (BMI) 34.0-34.9, adult: Secondary | ICD-10-CM | POA: Diagnosis not present

## 2024-08-04 DIAGNOSIS — I10 Essential (primary) hypertension: Secondary | ICD-10-CM | POA: Insufficient documentation

## 2024-08-04 DIAGNOSIS — K635 Polyp of colon: Secondary | ICD-10-CM

## 2024-08-04 DIAGNOSIS — Z79899 Other long term (current) drug therapy: Secondary | ICD-10-CM | POA: Diagnosis not present

## 2024-08-04 DIAGNOSIS — K640 First degree hemorrhoids: Secondary | ICD-10-CM | POA: Insufficient documentation

## 2024-08-04 DIAGNOSIS — E782 Mixed hyperlipidemia: Secondary | ICD-10-CM | POA: Diagnosis not present

## 2024-08-04 DIAGNOSIS — R195 Other fecal abnormalities: Secondary | ICD-10-CM | POA: Insufficient documentation

## 2024-08-04 HISTORY — PX: COLONOSCOPY: SHX5424

## 2024-08-04 HISTORY — PX: POLYPECTOMY: SHX149

## 2024-08-04 LAB — GLUCOSE, CAPILLARY: Glucose-Capillary: 143 mg/dL — ABNORMAL HIGH (ref 70–99)

## 2024-08-04 SURGERY — COLONOSCOPY
Anesthesia: General

## 2024-08-04 MED ORDER — PROPOFOL 500 MG/50ML IV EMUL
INTRAVENOUS | Status: DC | PRN
  Administered 2024-08-04: 50 mg via INTRAVENOUS
  Administered 2024-08-04: 150 ug/kg/min via INTRAVENOUS

## 2024-08-04 MED ORDER — SODIUM CHLORIDE 0.9 % IV SOLN: INTRAVENOUS | Status: DC

## 2024-08-04 MED ORDER — LIDOCAINE HCL (CARDIAC) PF 100 MG/5ML IV SOSY
PREFILLED_SYRINGE | INTRAVENOUS | Status: DC | PRN
  Administered 2024-08-04: 100 mg via INTRAVENOUS

## 2024-08-04 NOTE — Transfer of Care (Signed)
 Immediate Anesthesia Transfer of Care Note  Patient: Crystal Gibson  Procedure(s) Performed: COLONOSCOPY POLYPECTOMY, INTESTINE  Patient Location: PACU  Anesthesia Type:General  Level of Consciousness: awake and patient cooperative  Airway & Oxygen Therapy: Patient Spontanous Breathing  Post-op Assessment: Report given to RN and Post -op Vital signs reviewed and stable  Post vital signs: stable  Last Vitals:  Vitals Value Taken Time  BP    Temp    Pulse    Resp    SpO2      Last Pain: There were no vitals filed for this visit.       Complications: No notable events documented.

## 2024-08-04 NOTE — Op Note (Signed)
 [facility] Gastroenterology Patient Name: Crystal Gibson Procedure Date: 08/04/2024 8:06 AM MRN: [id number] Account #: [id number] Date of Birth: November 20, 1971 Admit Type: Outpatient Age: 53 Room: [facility] ENDO ROOM 4 Gender: Female Note Status: Finalized Instrument Name: Colon Scope [id number] Procedure:             Colonoscopy Indications:           Positive Cologuard test Providers:             Rogelia Copping MD, MD Referring MD:          Carin Gauze, NP Medicines:             Propofol  per Anesthesia Complications:         No immediate complications. Procedure:             Pre-Anesthesia Assessment:                        - Prior to the procedure, a History and Physical was                         performed, and patient medications and allergies were                         reviewed. The patient's tolerance of previous                         anesthesia was also reviewed. The risks and benefits                         of the procedure and the sedation options and risks                         were discussed with the patient. All questions were                         answered, and informed consent was obtained. Prior                         Anticoagulants: The patient has taken no anticoagulant                         or antiplatelet agents. ASA Grade Assessment: II - A                         patient with mild systemic disease. After reviewing                         the risks and benefits, the patient was deemed in                         satisfactory condition to undergo the procedure.                        After obtaining informed consent, the colonoscope was                         passed under direct vision. Throughout the procedure,  the patient's blood pressure, pulse, and oxygen                         saturations were monitored continuously. The                         Colonoscope was introduced through the anus and                          advanced to the the cecum, identified by appendiceal                         orifice and ileocecal valve. The colonoscopy was                         performed without difficulty. The patient tolerated                         the procedure well. The quality of the bowel                         preparation was good. Findings:      The perianal and digital rectal examinations were normal.      A 4 mm polyp was found in the cecum. The polyp was sessile. The polyp       was removed with a cold snare. Resection and retrieval were complete.      A 4 mm polyp was found in the transverse colon. The polyp was sessile.       The polyp was removed with a cold snare. Resection and retrieval were       complete.      Non-bleeding internal hemorrhoids were found during retroflexion. The       hemorrhoids were Grade I (internal hemorrhoids that do not prolapse). Impression:            - One 4 mm polyp in the cecum, removed with a cold                         snare. Resected and retrieved.                        - One 4 mm polyp in the transverse colon, removed with                         a cold snare. Resected and retrieved.                        - Non-bleeding internal hemorrhoids. Recommendation:        - Discharge patient to home.                        - Resume previous diet.                        - Continue present medications.                        - Await pathology results.                        -  If the pathology report reveals adenomatous tissue,                         then repeat the colonoscopy for surveillance in 7                         years. Procedure Code(s):     --- Professional ---                        [phone], Colonoscopy, flexible; with removal of                         tumor(s), polyp(s), or other lesion(s) by snare                         technique Diagnosis Code(s):     --- Professional ---                        R19.5, Other fecal abnormalities                         D12.0, Benign neoplasm of cecum CPT copyright 2022 [employer]. All rights reserved. The codes documented in this report are preliminary and upon coder review may  be revised to meet current compliance requirements. Rogelia Copping MD, MD 08/04/2024 8:35:15 AM This report has been signed electronically. Number of Addenda: 0 Note Initiated On: 08/04/2024 8:06 AM Scope Withdrawal Time: 0 hours 9 minutes 53 seconds  Total Procedure Duration: 0 hours 14 minutes 16 seconds  Estimated Blood Loss:  Estimated blood loss: none.      [facility]

## 2024-08-04 NOTE — Anesthesia Postprocedure Evaluation (Signed)
 Anesthesia Post Note  Patient: Crystal Gibson  Procedure(s) Performed: COLONOSCOPY POLYPECTOMY, INTESTINE  Patient location during evaluation: PACU Anesthesia Type: General Level of consciousness: awake Pain management: pain level controlled Vital Signs Assessment: post-procedure vital signs reviewed and stable Respiratory status: spontaneous breathing Cardiovascular status: stable Anesthetic complications: no   There were no known notable events for this encounter.   Last Vitals:  Vitals:   08/04/24 0847 08/04/24 0857  BP: 118/82 122/77  Pulse: 95 85  Resp: 18 18  Temp: (!) 35.7 C   SpO2: 100% 98%    Last Pain:  Vitals:   08/04/24 0857  TempSrc:   PainSc: 0-No pain                 VAN STAVEREN,Myca Perno

## 2024-08-04 NOTE — H&P (Signed)
 Rogelia Copping, MD [facility] [address], [address] [place], [place] [address] [phone] Fax : [phone]  Primary Care Physician:  Carin Gauze, NP Primary Gastroenterologist:  Dr. Copping  Pre-Procedure History & Physical: HPI:  Crystal Gibson is a 53 y.o. female is here for an colonoscopy.   Past Medical History:  Diagnosis Date   Anxiety    Asthma    Bacterial conjunctivitis of right eye 03/16/2023   COVID 2021   Hospitalized with long Covid   Depression    Environmental allergies    Gestational diabetes    Pt is a type 2 diabetes   History of blood clots    when hospitalized for Salmonella poisoning and had Covid at the same time   Lumbar sprain 11/03/2018   Pericarditis 2021   while in hospital for Salmonella poisoning   Sesamoiditis of foot 11/05/2020   Sleep apnea    does not use a cpap    Past Surgical History:  Procedure Laterality Date   ABDOMINAL HYSTERECTOMY     NO PAST SURGERIES     bunionectomy   OOPHORECTOMY     WISDOM TOOTH EXTRACTION      Prior to Admission medications   Medication Sig Start Date End Date Taking? Authorizing Provider  ADVAIR  HFA 115-21 MCG/ACT inhaler SMARTSIG:2 Puff(s) By Mouth Twice Daily   Yes [provider]  dapagliflozin  propanediol (FARXIGA ) 10 MG TABS tablet TAKE 1 TABLET BY MOUTH DAILY FOR DIABETES 02/05/24  Yes Scoggins, Amber, NP  DULoxetine  (CYMBALTA ) 60 MG capsule Take 1 capsule (60 mg total) by mouth daily. 02/05/24  Yes Scoggins, Hospital doctor, NP  losartan  (COZAAR ) 25 MG tablet TAKE 1 TABLET(25 MG) BY MOUTH DAILY 05/02/24  Yes Fernand Denyse LABOR, MD  metoprolol  succinate (TOPROL -XL) 100 MG 24 hr tablet Take 2 tablets (200 mg total) by mouth daily. 02/02/24  Yes Fernand Denyse LABOR, MD  OXYGEN Inhale 2 L into the lungs as needed (exertion).   Yes [provider]  pantoprazole  (PROTONIX ) 40 MG tablet Take 1 tablet (40 mg total) by mouth 2 (two) times daily. 02/05/24  Yes Scoggins, Hospital doctor, NP  potassium chloride   (KLOR-CON  M) 10 MEQ tablet TAKE 1 TABLET BY MOUTH DAILY WITH FUROSEMIDE  TO PREVENT POTASSIUM DEPLETION 07/14/24  Yes Scoggins, Hospital doctor, NP  topiramate  (TOPAMAX ) 50 MG tablet Take 1 tablet (50 mg total) by mouth at bedtime. 04/11/24  Yes Scoggins, Hospital doctor, NP  ACCU-CHEK FASTCLIX LANCETS MISC  11/01/18   [provider]  ARIPiprazole  (ABILIFY ) 15 MG tablet Take 1 tablet (15 mg total) by mouth daily. Patient not taking: Reported on 08/04/2024 02/05/24   Scoggins, Amber, NP  baclofen (LIORESAL) 10 MG tablet Take 10 mg by mouth 3 (three) times daily. 02/04/22   [provider]  BD PEN NEEDLE NANO 2ND GEN 32G X 4 MM MISC USE THREE TIMES DAILY BEFORE MEALS 01/12/24   Scoggins, Hospital doctor, NP  Blood Glucose Monitoring Suppl (ACCU-CHEK GUIDE) w/Device KIT USE DEVICE TO CHECK SUGARS DAILY 05/16/19   [provider]  Butalbital -APAP-Caffeine  50-300-40 MG CAPS Take 1 capsule by mouth every 6 (six) hours as needed (persistent headache). 05/03/24   Orlean Alan HERO, FNP  Continuous Glucose Receiver (DEXCOM G7 RECEIVER) DEVI Use with sensor to check continuous glucose 04/25/24   Scoggins, [facility], NP  Continuous Glucose Sensor (DEXCOM G7 SENSOR) MISC Apply to skin for continuous glucose monitoring 04/25/24   Scoggins, [facility], NP  digoxin  (LANOXIN ) 0.25 MG tablet Take 1 tablet (250 mcg total) by mouth  daily. Patient not taking: Reported on 08/04/2024 02/02/24   Fernand Alter A, MD  EPINEPHrine  0.3 mg/0.3 mL IJ SOAJ injection Inject 0.3 mg into the muscle as needed for anaphylaxis. 08/24/23   Scoggins, Amber, NP  Evolocumab  (REPATHA  SURECLICK) 140 MG/ML SOAJ Inject 140 mg into the skin every 14 (fourteen) days. 02/02/24   Fernand Alter LABOR, MD  famotidine  (PEPCID ) 20 MG tablet Take 1 tablet (20 mg total) by mouth daily. 02/05/24   Scoggins, Amber, NP  fexofenadine -pseudoephedrine (ALLEGRA-D) 60-120 MG 12 hr tablet Take 1 tablet by mouth 2 (two) times daily. 08/24/23 08/23/24  Scoggins, Amber, NP  fluticasone  (FLONASE ) 50  MCG/ACT nasal spray Place 1 spray into both nostrils 2 (two) times daily. 09/28/16   Cuthriell, Dorn BIRCH, PA-C  furosemide  (LASIX ) 20 MG tablet Take 1 tablet (20 mg total) by mouth daily. Patient not taking: Reported on 08/04/2024 02/02/24   Fernand Alter LABOR, MD  gabapentin  (NEURONTIN ) 300 MG capsule Take 1 capsule (300 mg total) by mouth 3 (three) times daily. 02/05/24   Scoggins, Hospital doctor, NP  glucose blood (ACCU-CHEK GUIDE) test strip Accu-Chek Guide In Vitro Strip QTY: 200 strip Days: 90 Refills: 1  Written: 04/27/20 Patient Instructions: TEST FASTING BLOOD SUGAR EVERY MORNING FASTING AND EVERY EVENING E11.65 04/27/20   [provider]  hydrocortisone  cream 1 % Apply to affected area 2 times daily Patient not taking: Reported on 07/04/2024 04/11/24 04/11/25  Scoggins, Amber, NP  icosapent  Ethyl (VASCEPA ) 1 g capsule TAKE 2 CAPSULES(2 GRAMS) BY MOUTH TWICE DAILY Patient not taking: Reported on 07/04/2024 05/26/24   Fernand Alter LABOR, MD  insulin  degludec (TRESIBA  FLEXTOUCH) 200 UNIT/ML FlexTouch Pen Inject 48 Units into the skin daily. 02/05/24   Scoggins, Hospital doctor, NP  Insulin  Pen Needle (GLOBAL EASY GLIDE PEN NEEDLES) 32G X 4 MM MISC Inject 4 Needles into the skin in the morning, at noon, in the evening, and at bedtime. 03/26/23   Glennon Sand, NP  ivabradine  (CORLANOR) 7.5 MG TABS tablet Take 1 tablet (7.5 mg total) by mouth 2 (two) times daily. 02/02/24   Fernand Alter LABOR, MD  Lancets Misc. (ACCU-CHEK FASTCLIX LANCET) KIT Accu-Chek Fastclix Lancet Drum  USE TO CHECK FASTING IN AM AND AS NEEDED FOR HIGH OR LOW    [provider]  NOVOLOG  FLEXPEN 100 UNIT/ML FlexPen Inject 10 Units into the skin 3 (three) times daily with meals. 02/05/24   Scoggins, Hospital doctor, NP  olmesartan  (BENICAR ) 5 MG tablet TAKE 1 TABLET(5 MG) BY MOUTH DAILY 07/14/24   Fernand Alter LABOR, MD  Rimegepant Sulfate (NURTEC) 75 MG TBDP Take 1 tablet (75 mg total) by mouth over 48 hr. 02/05/24   Scoggins, Hospital doctor, NP  rosuvastatin  (CRESTOR ) 40 MG  tablet TAKE 1 TABLET(40 MG) BY MOUTH DAILY 05/02/24   Fernand Alter LABOR, MD  sucralfate  (CARAFATE ) 1 g tablet Take 1 tablet (1 g total) by mouth 4 (four) times daily. 03/07/24   Scoggins, Amber, NP  SYMBICORT  160-4.5 MCG/ACT inhaler Inhale 2 puffs into the lungs in the morning and at bedtime. Patient not taking: Reported on 08/04/2024 02/05/24   Scoggins, [facility], NP  tirzepatide  (MOUNJARO ) 5 MG/0.5ML Pen Inject 5 mg into the skin once a week. 05/16/24   Scoggins, Amber, NP  traZODone (DESYREL) 100 MG tablet Take 200 mg by mouth at bedtime.     [provider]    Allergies as of 06/13/2024 - Review Complete 06/13/2024  Allergen Reaction Noted   Glucophage [metformin] Nausea And Vomiting 12/19/2019  Family History  Problem Relation Age of Onset   Breast cancer Sister 21   Breast cancer Maternal Aunt    Breast cancer Paternal Aunt    Breast cancer Maternal Grandmother    Breast cancer Cousin     Social History   Socioeconomic History   Marital status: Single    Spouse name: Not on file   Number of children: Not on file   Years of education: Not on file   Highest education level: Not on file  Occupational History   Not on file  Tobacco Use   Smoking status: Never   Smokeless tobacco: Never  Vaping Use   Vaping status: Never Used  Substance and Sexual Activity   Alcohol use: No   Drug use: No   Sexual activity: Yes    Birth control/protection: Surgical  Other Topics Concern   Not on file  Social History Narrative   Not on file   Social Drivers of Health   Financial Resource Strain: High Risk (08/06/2022)   Overall Financial Resource Strain (CARDIA)    Difficulty of Paying Living Expenses: Hard  Food Insecurity: Food Insecurity Present (08/06/2022)   Hunger Vital Sign    Worried About Running Out of Food in the Last Year: Sometimes true    Ran Out of Food in the Last Year: Sometimes true  Transportation Needs: No Transportation Needs (10/13/2022)   PRAPARE -  Administrator, Civil Service (Medical): No    Lack of Transportation (Non-Medical): No  Physical Activity: Inactive (09/08/2022)   Exercise Vital Sign    Days of Exercise per Week: 0 days    Minutes of Exercise per Session: 0 min  Stress: Stress Concern Present (06/24/2022)   [employer] of Occupational Health - Occupational Stress Questionnaire    Feeling of Stress : To some extent  Social Connections: Socially Isolated (07/30/2022)   Social Connection and Isolation Panel    Frequency of Communication with Friends and Family: More than three times a week    Frequency of Social Gatherings with Friends and Family: More than three times a week    Attends Religious Services: Never    Database administrator or Organizations: No    Attends Banker Meetings: Never    Marital Status: Never married  Intimate Partner Violence: Not At Risk (10/13/2022)   Humiliation, Afraid, Rape, and Kick questionnaire    Fear of Current or Ex-Partner: No    Emotionally Abused: No    Physically Abused: No    Sexually Abused: No    Review of Systems: See HPI, otherwise negative ROS  Physical Exam: BP 131/85   Pulse 90   Temp (!) 97 F (36.1 C)   Resp 18   Wt 103.6 kg   SpO2 99%   BMI 34.73 kg/m  General:   Alert,  pleasant and cooperative in NAD Head:  Normocephalic and atraumatic. Neck:  Supple; no masses or thyromegaly. Lungs:  Clear throughout to auscultation.    Heart:  Regular rate and rhythm. Abdomen:  Soft, nontender and nondistended. Normal bowel sounds, without guarding, and without rebound.   Neurologic:  Alert and  oriented x4;  grossly normal neurologically.  Impression/Plan: Crystal Gibson is here for an colonoscopy to be performed for positive cologuard  Risks, benefits, limitations, and alternatives regarding  colonoscopy have been reviewed with the patient.  Questions have been answered.  All parties agreeable.   Rogelia Copping, MD  08/04/2024, 8:07  AM

## 2024-08-04 NOTE — Anesthesia Preprocedure Evaluation (Signed)
 Anesthesia Evaluation  Patient identified by MRN, date of birth, ID band Patient awake    Reviewed: Allergy & Precautions, NPO status , Patient's Chart, lab work & pertinent test results  Airway Mallampati: II  TM Distance: >3 FB Neck ROM: Full    Dental  (+) Teeth Intact   Pulmonary neg pulmonary ROS, sleep apnea    Pulmonary exam normal breath sounds clear to auscultation       Cardiovascular Exercise Tolerance: Good hypertension, Pt. on medications negative cardio ROS Normal cardiovascular exam Rhythm:Regular Rate:Normal     Neuro/Psych   Anxiety     negative neurological ROS  negative psych ROS   GI/Hepatic negative GI ROS, Neg liver ROS,,,  Endo/Other  negative endocrine ROSdiabetes, Well Controlled, Type 1, Insulin  Dependent  Class 3 obesity  Renal/GU negative Renal ROS  negative genitourinary   Musculoskeletal   Abdominal  (+) + obese  Peds  Hematology negative hematology ROS (+)   Anesthesia Other Findings Past Medical History: No date: Anxiety No date: Asthma 03/16/2023: Bacterial conjunctivitis of right eye 2021: COVID     Comment:  Hospitalized with long Covid No date: Depression No date: Environmental allergies No date: Gestational diabetes     Comment:  Pt is a type 2 diabetes No date: History of blood clots     Comment:  when hospitalized for Salmonella poisoning and had Covid              at the same time 11/03/2018: Lumbar sprain 2021: Pericarditis     Comment:  while in hospital for Salmonella poisoning 11/05/2020: Sesamoiditis of foot No date: Sleep apnea     Comment:  does not use a cpap  Past Surgical History: No date: ABDOMINAL HYSTERECTOMY No date: NO PAST SURGERIES     Comment:  bunionectomy No date: OOPHORECTOMY No date: WISDOM TOOTH EXTRACTION  BMI    Body Mass Index: 34.73 kg/m      Reproductive/Obstetrics negative OB ROS                               Anesthesia Physical Anesthesia Plan  ASA: 3  Anesthesia Plan: General   Post-op Pain Management:    Induction: Intravenous  PONV Risk Score and Plan: Propofol  infusion and TIVA  Airway Management Planned: Natural Airway and Nasal Cannula  Additional Equipment:   Intra-op Plan:   Post-operative Plan:   Informed Consent: I have reviewed the patients History and Physical, chart, labs and discussed the procedure including the risks, benefits and alternatives for the proposed anesthesia with the patient or authorized representative who has indicated his/her understanding and acceptance.     Dental Advisory Given  Plan Discussed with: CRNA  Anesthesia Plan Comments:         Anesthesia Quick Evaluation

## 2024-08-05 LAB — SURGICAL PATHOLOGY

## 2024-08-06 ENCOUNTER — Other Ambulatory Visit: Payer: Self-pay | Admitting: Cardiology

## 2024-08-06 ENCOUNTER — Other Ambulatory Visit: Payer: Self-pay | Admitting: Cardiovascular Disease

## 2024-08-06 DIAGNOSIS — I4711 Inappropriate sinus tachycardia, so stated: Secondary | ICD-10-CM

## 2024-08-08 ENCOUNTER — Ambulatory Visit: Payer: Self-pay | Admitting: Gastroenterology

## 2024-08-15 ENCOUNTER — Encounter: Payer: Self-pay | Admitting: Cardiovascular Disease

## 2024-08-15 ENCOUNTER — Ambulatory Visit (INDEPENDENT_AMBULATORY_CARE_PROVIDER_SITE_OTHER): Admitting: Cardiovascular Disease

## 2024-08-15 VITALS — BP 124/78 | HR 98 | Ht 68.0 in | Wt 225.2 lb

## 2024-08-15 DIAGNOSIS — R55 Syncope and collapse: Secondary | ICD-10-CM

## 2024-08-15 DIAGNOSIS — R0602 Shortness of breath: Secondary | ICD-10-CM | POA: Diagnosis not present

## 2024-08-15 DIAGNOSIS — I10 Essential (primary) hypertension: Secondary | ICD-10-CM | POA: Diagnosis not present

## 2024-08-15 DIAGNOSIS — I4711 Inappropriate sinus tachycardia, so stated: Secondary | ICD-10-CM

## 2024-08-15 DIAGNOSIS — E782 Mixed hyperlipidemia: Secondary | ICD-10-CM

## 2024-08-15 NOTE — Progress Notes (Signed)
 Cardiology Office Note   Date:  08/15/2024   ID:  Crystal Gibson, DOB May 12, 1971, MRN [id number]  PCP:  Carin Gauze, NP  Cardiologist:  Denyse Bathe, MD      History of Present Illness: Crystal Gibson is a 53 y.o. female who presents for  Chief Complaint  Patient presents with   Follow-up    2 week echo results    Has headache, no chest pain.      Past Medical History:  Diagnosis Date   Anxiety    Asthma    Bacterial conjunctivitis of right eye 03/16/2023   COVID 2021   Hospitalized with long Covid   Depression    Environmental allergies    Gestational diabetes    Pt is a type 2 diabetes   History of blood clots    when hospitalized for Salmonella poisoning and had Covid at the same time   Lumbar sprain 11/03/2018   Pericarditis 2021   while in hospital for Salmonella poisoning   Sesamoiditis of foot 11/05/2020   Sleep apnea    does not use a cpap     Past Surgical History:  Procedure Laterality Date   ABDOMINAL HYSTERECTOMY     COLONOSCOPY N/A 08/04/2024   Procedure: COLONOSCOPY;  Surgeon: Jinny Carmine, MD;  Location: [facility] ENDOSCOPY;  Service: Endoscopy;  Laterality: N/A;   NO PAST SURGERIES     bunionectomy   OOPHORECTOMY     POLYPECTOMY  08/04/2024   Procedure: POLYPECTOMY, INTESTINE;  Surgeon: Jinny Carmine, MD;  Location: [facility] ENDOSCOPY;  Service: Endoscopy;;   WISDOM TOOTH EXTRACTION       Current Outpatient Medications  Medication Sig Dispense Refill   ACCU-CHEK FASTCLIX LANCETS MISC   2   ADVAIR  HFA 115-21 MCG/ACT inhaler SMARTSIG:2 Puff(s) By Mouth Twice Daily     ARIPiprazole  (ABILIFY ) 15 MG tablet Take 1 tablet (15 mg total) by mouth daily. 30 tablet 7   baclofen (LIORESAL) 10 MG tablet Take 10 mg by mouth 3 (three) times daily.     BD PEN NEEDLE NANO 2ND GEN 32G X 4 MM MISC USE THREE TIMES DAILY BEFORE MEALS 100 each 3   Blood Glucose Monitoring Suppl (ACCU-CHEK GUIDE) w/Device KIT USE DEVICE TO CHECK SUGARS DAILY      Butalbital -APAP-Caffeine  50-300-40 MG CAPS Take 1 capsule by mouth every 6 (six) hours as needed (persistent headache). 120 capsule 1   Continuous Glucose Receiver (DEXCOM G7 RECEIVER) DEVI Use with sensor to check continuous glucose 1 each 0   Continuous Glucose Sensor (DEXCOM G7 SENSOR) MISC Apply to skin for continuous glucose monitoring 2 each 11   dapagliflozin  propanediol (FARXIGA ) 10 MG TABS tablet TAKE 1 TABLET BY MOUTH DAILY FOR DIABETES 90 tablet 0   digoxin  (LANOXIN ) 0.25 MG tablet Take 1 tablet (250 mcg total) by mouth daily. 90 tablet 1   DULoxetine  (CYMBALTA ) 60 MG capsule Take 1 capsule (60 mg total) by mouth daily. 90 capsule 3   EPINEPHrine  0.3 mg/0.3 mL IJ SOAJ injection Inject 0.3 mg into the muscle as needed for anaphylaxis. 1 each 6   Evolocumab  (REPATHA  SURECLICK) 140 MG/ML SOAJ Inject 140 mg into the skin every 14 (fourteen) days. 2 mL 6   famotidine  (PEPCID ) 20 MG tablet Take 1 tablet (20 mg total) by mouth daily. 90 tablet 1   fexofenadine -pseudoephedrine (ALLEGRA-D) 60-120 MG 12 hr tablet Take 1 tablet by mouth 2 (two) times daily. 30 tablet 11   fluticasone  (FLONASE ) 50 MCG/ACT nasal  spray Place 1 spray into both nostrils 2 (two) times daily. 16 g 0   furosemide  (LASIX ) 20 MG tablet Take 1 tablet (20 mg total) by mouth daily. 90 tablet 1   gabapentin  (NEURONTIN ) 300 MG capsule Take 1 capsule (300 mg total) by mouth 3 (three) times daily. 90 capsule 3   glucose blood (ACCU-CHEK GUIDE) test strip Accu-Chek Guide In Vitro Strip QTY: 200 strip Days: 90 Refills: 1  Written: 04/27/20 Patient Instructions: TEST FASTING BLOOD SUGAR EVERY MORNING FASTING AND EVERY EVENING E11.65     hydrocortisone  cream 1 % Apply to affected area 2 times daily 30 g 1   icosapent  Ethyl (VASCEPA ) 1 g capsule TAKE 2 CAPSULES(2 GRAMS) BY MOUTH TWICE DAILY 120 capsule 1   insulin  degludec (TRESIBA  FLEXTOUCH) 200 UNIT/ML FlexTouch Pen Inject 48 Units into the skin daily. 9 mL 3   Insulin  Pen Needle  (GLOBAL EASY GLIDE PEN NEEDLES) 32G X 4 MM MISC Inject 4 Needles into the skin in the morning, at noon, in the evening, and at bedtime. 100 each 5   ivabradine  (CORLANOR) 7.5 MG TABS tablet TAKE 1 TABLET(7.5 MG) BY MOUTH TWICE DAILY 180 tablet 0   Lancets Misc. (ACCU-CHEK FASTCLIX LANCET) KIT Accu-Chek Fastclix Lancet Drum  USE TO CHECK FASTING IN AM AND AS NEEDED FOR HIGH OR LOW     losartan  (COZAAR ) 25 MG tablet TAKE 1 TABLET(25 MG) BY MOUTH DAILY 90 tablet 1   metoprolol  succinate (TOPROL -XL) 100 MG 24 hr tablet Take 2 tablets (200 mg total) by mouth daily. 90 tablet 1   NOVOLOG  FLEXPEN 100 UNIT/ML FlexPen Inject 10 Units into the skin 3 (three) times daily with meals. 15 mL 11   olmesartan  (BENICAR ) 5 MG tablet TAKE 1 TABLET(5 MG) BY MOUTH DAILY 90 tablet 3   OXYGEN Inhale 2 L into the lungs as needed (exertion).     pantoprazole  (PROTONIX ) 40 MG tablet Take 1 tablet (40 mg total) by mouth 2 (two) times daily. 180 tablet 3   potassium chloride  (KLOR-CON  M) 10 MEQ tablet TAKE 1 TABLET BY MOUTH DAILY WITH FUROSEMIDE  TO PREVENT POTASSIUM DEPLETION 30 tablet 11   Rimegepant Sulfate (NURTEC) 75 MG TBDP Take 1 tablet (75 mg total) by mouth over 48 hr. 12 tablet 3   rosuvastatin  (CRESTOR ) 40 MG tablet TAKE 1 TABLET(40 MG) BY MOUTH DAILY 90 tablet 1   sucralfate  (CARAFATE ) 1 g tablet Take 1 tablet (1 g total) by mouth 4 (four) times daily. 120 tablet 1   SYMBICORT  160-4.5 MCG/ACT inhaler Inhale 2 puffs into the lungs in the morning and at bedtime. 1 each 11   tirzepatide  (MOUNJARO ) 5 MG/0.5ML Pen Inject 5 mg into the skin once a week. 0.5 mL 6   topiramate  (TOPAMAX ) 50 MG tablet Take 1 tablet (50 mg total) by mouth at bedtime. 30 tablet 6   traZODone (DESYREL) 100 MG tablet Take 200 mg by mouth at bedtime.      No current facility-administered medications for this visit.    Allergies:   Glucophage [metformin]    Social History:   reports that she has never smoked. She has never used smokeless  tobacco. She reports that she does not drink alcohol and does not use drugs.   Family History:  family history includes Breast cancer in her cousin, maternal aunt, maternal grandmother, and paternal aunt; Breast cancer (age of onset: 55) in her sister.    ROS:     Review of Systems  Constitutional: Negative.  HENT: Negative.    Eyes: Negative.   Respiratory: Negative.    Gastrointestinal: Negative.   Genitourinary: Negative.   Musculoskeletal: Negative.   Skin: Negative.   Neurological: Negative.   Endo/Heme/Allergies: Negative.   Psychiatric/Behavioral: Negative.    All other systems reviewed and are negative.     All other systems are reviewed and negative.    PHYSICAL EXAM: VS:  BP 124/78   Pulse 98   Ht 5' 8 (1.727 m)   Wt 225 lb 3.2 oz (102.2 kg)   SpO2 97%   BMI 34.24 kg/m  , BMI Body mass index is 34.24 kg/m. Last weight:  Wt Readings from Last 3 Encounters:  08/15/24 225 lb 3.2 oz (102.2 kg)  08/04/24 228 lb 6.4 oz (103.6 kg)  07/04/24 229 lb 12.8 oz (104.2 kg)     Physical Exam Constitutional:      Appearance: Normal appearance.  Cardiovascular:     Rate and Rhythm: Normal rate and regular rhythm.     Heart sounds: Normal heart sounds.  Pulmonary:     Effort: Pulmonary effort is normal.     Breath sounds: Normal breath sounds.  Musculoskeletal:     Right lower leg: No edema.     Left lower leg: No edema.  Neurological:     Mental Status: She is alert.       EKG:   Recent Labs: 05/13/2024: ALT 44; BUN 8; Creatinine, Ser 0.89; Potassium 4.3; Sodium 140; TSH 0.563    Lipid Panel    Component Value Date/Time   CHOL 131 05/13/2024 1032   TRIG 208 (H) 05/13/2024 1032   HDL 35 (L) 05/13/2024 1032   CHOLHDL 3.7 05/13/2024 1032   LDLCALC 62 05/13/2024 1032      Other studies Reviewed: Additional studies/ records that were reviewed today include:  Review of the above records demonstrates:       No data to display             ASSESSMENT AND PLAN:    ICD-10-CM   1. Syncope, unspecified syncope type  R55     2. Essential hypertension  I10     3. Mixed hyperlipidemia  E78.2     4. Inappropriate sinus tachycardia (HCC)  I47.11     5. SOB (shortness of breath)  R06.02    echo, showed grade 1 diastolic dysfunction. Normal LVEF. Has DOE. Taking farxiga        Problem List Items Addressed This Visit       Cardiovascular and Mediastinum   Inappropriate sinus tachycardia (HCC)     Other   Mixed hyperlipidemia   Other Visit Diagnoses       Syncope, unspecified syncope type    -  Primary     Essential hypertension         SOB (shortness of breath)       echo, showed grade 1 diastolic dysfunction. Normal LVEF. Has DOE. Taking farxiga           Disposition:   Return in about 2 months (around 10/15/2024).    Total time spent: 30 minutes  Signed,  Denyse Bathe, MD  08/15/2024 9:27 AM    [facility]

## 2024-08-22 ENCOUNTER — Telehealth: Payer: Self-pay | Admitting: Plastic Surgery

## 2024-08-22 NOTE — Telephone Encounter (Signed)
 Patient called to schedule surgery. Under review of patient's chart we have notes that Dr.Taylor does not want to see patient again. Called patient and let her know. She states she did not agree with this. She states she is going to write a letter. I let her know that was fine. But we will not see her back here. Eleanor Elbe

## 2024-08-27 ENCOUNTER — Other Ambulatory Visit: Payer: Self-pay | Admitting: Cardiology

## 2024-08-27 DIAGNOSIS — E119 Type 2 diabetes mellitus without complications: Secondary | ICD-10-CM

## 2024-08-30 ENCOUNTER — Other Ambulatory Visit: Payer: Self-pay | Admitting: Cardiovascular Disease

## 2024-08-30 DIAGNOSIS — G4733 Obstructive sleep apnea (adult) (pediatric): Secondary | ICD-10-CM

## 2024-08-30 DIAGNOSIS — I4711 Inappropriate sinus tachycardia, so stated: Secondary | ICD-10-CM

## 2024-08-30 DIAGNOSIS — I5189 Other ill-defined heart diseases: Secondary | ICD-10-CM

## 2024-08-30 DIAGNOSIS — I2694 Multiple subsegmental pulmonary emboli without acute cor pulmonale: Secondary | ICD-10-CM

## 2024-08-30 DIAGNOSIS — J9611 Chronic respiratory failure with hypoxia: Secondary | ICD-10-CM

## 2024-08-30 DIAGNOSIS — E782 Mixed hyperlipidemia: Secondary | ICD-10-CM

## 2024-09-08 ENCOUNTER — Other Ambulatory Visit: Payer: Self-pay | Admitting: Cardiology

## 2024-09-19 ENCOUNTER — Ambulatory Visit: Admitting: Cardiology

## 2024-09-22 ENCOUNTER — Other Ambulatory Visit

## 2024-09-22 DIAGNOSIS — E119 Type 2 diabetes mellitus without complications: Secondary | ICD-10-CM

## 2024-09-22 DIAGNOSIS — E782 Mixed hyperlipidemia: Secondary | ICD-10-CM

## 2024-09-22 DIAGNOSIS — Z1329 Encounter for screening for other suspected endocrine disorder: Secondary | ICD-10-CM | POA: Diagnosis not present

## 2024-09-22 DIAGNOSIS — Z794 Long term (current) use of insulin: Secondary | ICD-10-CM | POA: Diagnosis not present

## 2024-09-23 ENCOUNTER — Ambulatory Visit: Payer: Self-pay | Admitting: Cardiology

## 2024-09-23 LAB — CMP14+EGFR
ALT: 37 IU/L — ABNORMAL HIGH (ref 0–32)
AST: 24 IU/L (ref 0–40)
Albumin: 3.8 g/dL (ref 3.8–4.9)
Alkaline Phosphatase: 52 IU/L (ref 49–135)
BUN/Creatinine Ratio: 10 (ref 9–23)
BUN: 8 mg/dL (ref 6–24)
Bilirubin Total: 0.5 mg/dL (ref 0.0–1.2)
CO2: 23 mmol/L (ref 20–29)
Calcium: 8.9 mg/dL (ref 8.7–10.2)
Chloride: 105 mmol/L (ref 96–106)
Creatinine, Ser: 0.8 mg/dL (ref 0.57–1.00)
Globulin, Total: 2.2 g/dL (ref 1.5–4.5)
Glucose: 195 mg/dL — ABNORMAL HIGH (ref 70–99)
Potassium: 4.1 mmol/L (ref 3.5–5.2)
Sodium: 140 mmol/L (ref 134–144)
Total Protein: 6 g/dL (ref 6.0–8.5)
eGFR: 88 mL/min/1.73 (ref >59)

## 2024-09-23 LAB — HEMOGLOBIN A1C
Est. average glucose Bld gHb Est-mCnc: 140 mg/dL
Hgb A1c MFr Bld: 6.5 % — ABNORMAL HIGH (ref 4.8–5.6)

## 2024-09-23 LAB — LIPID PANEL
Chol/HDL Ratio: 5.9 ratio — ABNORMAL HIGH (ref 0.0–4.4)
Cholesterol, Total: 206 mg/dL — ABNORMAL HIGH (ref 100–199)
HDL: 35 mg/dL — ABNORMAL LOW (ref >39)
LDL Chol Calc (NIH): 135 mg/dL — ABNORMAL HIGH (ref 0–99)
Triglycerides: 198 mg/dL — ABNORMAL HIGH (ref 0–149)
VLDL Cholesterol Cal: 36 mg/dL (ref 5–40)

## 2024-09-23 LAB — TSH: TSH: 0.701 u[IU]/mL (ref 0.450–4.500)

## 2024-09-26 ENCOUNTER — Ambulatory Visit: Admitting: Cardiology

## 2024-09-26 ENCOUNTER — Ambulatory Visit: Payer: Self-pay | Admitting: Cardiology

## 2024-09-26 ENCOUNTER — Encounter: Payer: Self-pay | Admitting: Cardiology

## 2024-09-26 VITALS — BP 122/84 | HR 93 | Ht 68.0 in | Wt 226.0 lb

## 2024-09-26 DIAGNOSIS — E6609 Other obesity due to excess calories: Secondary | ICD-10-CM

## 2024-09-26 DIAGNOSIS — R55 Syncope and collapse: Secondary | ICD-10-CM

## 2024-09-26 DIAGNOSIS — E119 Type 2 diabetes mellitus without complications: Secondary | ICD-10-CM

## 2024-09-26 DIAGNOSIS — Z794 Long term (current) use of insulin: Secondary | ICD-10-CM | POA: Diagnosis not present

## 2024-09-26 DIAGNOSIS — I5189 Other ill-defined heart diseases: Secondary | ICD-10-CM

## 2024-09-26 DIAGNOSIS — Z1231 Encounter for screening mammogram for malignant neoplasm of breast: Secondary | ICD-10-CM | POA: Diagnosis not present

## 2024-09-26 DIAGNOSIS — G4733 Obstructive sleep apnea (adult) (pediatric): Secondary | ICD-10-CM

## 2024-09-26 DIAGNOSIS — E782 Mixed hyperlipidemia: Secondary | ICD-10-CM

## 2024-09-26 DIAGNOSIS — Z6834 Body mass index (BMI) 34.0-34.9, adult: Secondary | ICD-10-CM | POA: Diagnosis not present

## 2024-09-26 DIAGNOSIS — R0789 Other chest pain: Secondary | ICD-10-CM

## 2024-09-26 DIAGNOSIS — J9611 Chronic respiratory failure with hypoxia: Secondary | ICD-10-CM

## 2024-09-26 DIAGNOSIS — E66811 Obesity, class 1: Secondary | ICD-10-CM | POA: Diagnosis not present

## 2024-09-26 DIAGNOSIS — I4711 Inappropriate sinus tachycardia, so stated: Secondary | ICD-10-CM

## 2024-09-26 DIAGNOSIS — I10 Essential (primary) hypertension: Secondary | ICD-10-CM | POA: Diagnosis not present

## 2024-09-26 DIAGNOSIS — I2694 Multiple subsegmental pulmonary emboli without acute cor pulmonale: Secondary | ICD-10-CM

## 2024-09-26 LAB — POCT UA - MICROALBUMIN
Albumin/Creatinine Ratio, Urine, POC: <30
Creatinine, POC: 100 mg/dL
Microalbumin Ur, POC: 10 mg/L

## 2024-09-26 MED ORDER — REPATHA SURECLICK 140 MG/ML ~~LOC~~ SOAJ: 140.0000 mg | SUBCUTANEOUS | 6 refills | Status: DC

## 2024-09-26 MED ORDER — DULOXETINE HCL 60 MG PO CPEP: 60.0000 mg | ORAL_CAPSULE | Freq: Every day | ORAL | 3 refills | Status: AC

## 2024-09-26 MED ORDER — SYMBICORT 160-4.5 MCG/ACT IN AERO: 2.0000 | INHALATION_SPRAY | Freq: Two times a day (BID) | RESPIRATORY_TRACT | 11 refills | Status: AC

## 2024-09-26 MED ORDER — DIGOXIN 250 MCG PO TABS: 250.0000 ug | ORAL_TABLET | Freq: Every day | ORAL | 1 refills | Status: AC

## 2024-09-26 MED ORDER — TRAZODONE HCL 100 MG PO TABS: 200.0000 mg | ORAL_TABLET | Freq: Every day | ORAL | 1 refills | Status: DC

## 2024-09-26 MED ORDER — NURTEC 75 MG PO TBDP: 75.0000 mg | ORAL_TABLET | ORAL | 3 refills | Status: DC

## 2024-09-26 MED ORDER — ICOSAPENT ETHYL 1 G PO CAPS: 2.0000 g | ORAL_CAPSULE | Freq: Two times a day (BID) | ORAL | 1 refills | Status: AC

## 2024-09-26 MED ORDER — HYDROCORTISONE 1 % EX CREA: TOPICAL_CREAM | CUTANEOUS | 1 refills | Status: AC

## 2024-09-26 MED ORDER — ARIPIPRAZOLE 15 MG PO TABS: 15.0000 mg | ORAL_TABLET | Freq: Every day | ORAL | 7 refills | Status: AC

## 2024-09-26 MED ORDER — MOUNJARO 7.5 MG/0.5ML ~~LOC~~ SOAJ: 7.5000 mg | SUBCUTANEOUS | 4 refills | Status: DC

## 2024-09-26 MED ORDER — NYSTATIN 100000 UNIT/GM EX POWD: 1.0000 | Freq: Three times a day (TID) | CUTANEOUS | 0 refills | Status: AC

## 2024-09-26 NOTE — Progress Notes (Signed)
 Established Patient Office Visit  Subjective:  Patient ID: Crystal Gibson, female    DOB: 1971/07/20  Age: 53 y.o. MRN: [id number]  Chief Complaint  Patient presents with   Follow-up    4 month fasting labs    Patient in office for 4 month follow up, discuss recent lab results. Patient doing well, no new complaints today.  Had a hysterectomy, no longer does pap smears.  Discussed recent lab work. Hgb A1c much improved. Needs a new referral to plastic surgery for paniculectomy. Referral sent.  Tolerating Mounjaro  5 mg weekly, will increase to 7.5 mg weekly.     No other concerns at this time.   Past Medical History:  Diagnosis Date   Anxiety    Asthma    Bacterial conjunctivitis of right eye 03/16/2023   COVID 2021   Hospitalized with long Covid   Depression    Environmental allergies    Gestational diabetes    Pt is a type 2 diabetes   History of blood clots    when hospitalized for Salmonella poisoning and had Covid at the same time   Lumbar sprain 11/03/2018   Pericarditis 2021   while in hospital for Salmonella poisoning   Sesamoiditis of foot 11/05/2020   Sleep apnea    does not use a cpap    Past Surgical History:  Procedure Laterality Date   ABDOMINAL HYSTERECTOMY     COLONOSCOPY N/A 08/04/2024   Procedure: COLONOSCOPY;  Surgeon: Jinny Carmine, MD;  Location: [facility] ENDOSCOPY;  Service: Endoscopy;  Laterality: N/A;   NO PAST SURGERIES     bunionectomy   OOPHORECTOMY     POLYPECTOMY  08/04/2024   Procedure: POLYPECTOMY, INTESTINE;  Surgeon: Jinny Carmine, MD;  Location: [facility] ENDOSCOPY;  Service: Endoscopy;;   WISDOM TOOTH EXTRACTION      Social History   Socioeconomic History   Marital status: Single    Spouse name: Not on file   Number of children: Not on file   Years of education: Not on file   Highest education level: Not on file  Occupational History   Not on file  Tobacco Use   Smoking status: Never   Smokeless tobacco: Never  Vaping Use   Vaping  status: Never Used  Substance and Sexual Activity   Alcohol use: No   Drug use: No   Sexual activity: Yes    Birth control/protection: Surgical  Other Topics Concern   Not on file  Social History Narrative   Not on file   Social Drivers of Health   Financial Resource Strain: High Risk (08/06/2022)   Overall Financial Resource Strain (CARDIA)    Difficulty of Paying Living Expenses: Hard  Food Insecurity: Food Insecurity Present (08/06/2022)   Hunger Vital Sign    Worried About Running Out of Food in the Last Year: Sometimes true    Ran Out of Food in the Last Year: Sometimes true  Transportation Needs: No Transportation Needs (10/13/2022)   PRAPARE - Administrator, Civil Service (Medical): No    Lack of Transportation (Non-Medical): No  Physical Activity: Inactive (09/08/2022)   Exercise Vital Sign    Days of Exercise per Week: 0 days    Minutes of Exercise per Session: 0 min  Stress: Stress Concern Present (06/24/2022)   [employer] of Occupational Health - Occupational Stress Questionnaire    Feeling of Stress : To some extent  Social Connections: Socially Isolated (07/30/2022)   Social Connection and Isolation Panel  Frequency of Communication with Friends and Family: More than three times a week    Frequency of Social Gatherings with Friends and Family: More than three times a week    Attends Religious Services: Never    Database administrator or Organizations: No    Attends Banker Meetings: Never    Marital Status: Never married  Intimate Partner Violence: Not At Risk (10/13/2022)   Humiliation, Afraid, Rape, and Kick questionnaire    Fear of Current or Ex-Partner: No    Emotionally Abused: No    Physically Abused: No    Sexually Abused: No    Family History  Problem Relation Age of Onset   Breast cancer Sister 76   Breast cancer Maternal Aunt    Breast cancer Paternal Aunt    Breast cancer Maternal Grandmother    Breast cancer  Cousin     Allergies  Allergen Reactions   Glucophage [Metformin] Nausea And Vomiting    Outpatient Medications Prior to Visit  Medication Sig   ACCU-CHEK FASTCLIX LANCETS MISC    ADVAIR  HFA 115-21 MCG/ACT inhaler SMARTSIG:2 Puff(s) By Mouth Twice Daily   baclofen (LIORESAL) 10 MG tablet Take 10 mg by mouth 3 (three) times daily.   BD PEN NEEDLE NANO 2ND GEN 32G X 4 MM MISC USE THREE TIMES DAILY BEFORE MEALS   Blood Glucose Monitoring Suppl (ACCU-CHEK GUIDE) w/Device KIT USE DEVICE TO CHECK SUGARS DAILY   Butalbital -APAP-Caffeine  50-300-40 MG CAPS Take 1 capsule by mouth every 6 (six) hours as needed (persistent headache).   Continuous Glucose Receiver (DEXCOM G7 RECEIVER) DEVI Use with sensor to check continuous glucose   Continuous Glucose Sensor (DEXCOM G7 SENSOR) MISC Apply to skin for continuous glucose monitoring   dapagliflozin  propanediol (FARXIGA ) 10 MG TABS tablet TAKE 1 TABLET BY MOUTH DAILY FOR DIABETES   EPINEPHrine  0.3 mg/0.3 mL IJ SOAJ injection INJECT 1 PEN IN THE MUSCLE AS NEEDED FOR ANAPHYLAXIS   famotidine  (PEPCID ) 20 MG tablet Take 1 tablet (20 mg total) by mouth daily.   fluticasone  (FLONASE ) 50 MCG/ACT nasal spray Place 1 spray into both nostrils 2 (two) times daily.   furosemide  (LASIX ) 20 MG tablet TAKE 1 TABLET(20 MG) BY MOUTH DAILY   gabapentin  (NEURONTIN ) 300 MG capsule Take 1 capsule (300 mg total) by mouth 3 (three) times daily.   glucose blood (ACCU-CHEK GUIDE) test strip Accu-Chek Guide In Vitro Strip QTY: 200 strip Days: 90 Refills: 1  Written: 04/27/20 Patient Instructions: TEST FASTING BLOOD SUGAR EVERY MORNING FASTING AND EVERY EVENING E11.65   Insulin  Pen Needle (GLOBAL EASY GLIDE PEN NEEDLES) 32G X 4 MM MISC Inject 4 Needles into the skin in the morning, at noon, in the evening, and at bedtime.   ivabradine  (CORLANOR) 7.5 MG TABS tablet TAKE 1 TABLET(7.5 MG) BY MOUTH TWICE DAILY   Lancets Misc. (ACCU-CHEK FASTCLIX LANCET) KIT Accu-Chek Fastclix Lancet  Drum  USE TO CHECK FASTING IN AM AND AS NEEDED FOR HIGH OR LOW   losartan  (COZAAR ) 25 MG tablet TAKE 1 TABLET(25 MG) BY MOUTH DAILY   metoprolol  succinate (TOPROL -XL) 100 MG 24 hr tablet Take 2 tablets (200 mg total) by mouth daily.   NOVOLOG  FLEXPEN 100 UNIT/ML FlexPen Inject 10 Units into the skin 3 (three) times daily with meals.   olmesartan  (BENICAR ) 5 MG tablet TAKE 1 TABLET(5 MG) BY MOUTH DAILY   OXYGEN Inhale 2 L into the lungs as needed (exertion).   pantoprazole  (PROTONIX ) 40 MG tablet Take 1 tablet (  40 mg total) by mouth 2 (two) times daily.   rosuvastatin  (CRESTOR ) 40 MG tablet TAKE 1 TABLET(40 MG) BY MOUTH DAILY   sucralfate  (CARAFATE ) 1 g tablet Take 1 tablet (1 g total) by mouth 4 (four) times daily.   topiramate  (TOPAMAX ) 50 MG tablet Take 1 tablet (50 mg total) by mouth at bedtime.   TRESIBA  FLEXTOUCH 200 UNIT/ML FlexTouch Pen ADMINISTER 48 UNITS UNDER THE SKIN DAILY   [DISCONTINUED] ARIPiprazole  (ABILIFY ) 15 MG tablet Take 1 tablet (15 mg total) by mouth daily.   [DISCONTINUED] digoxin  (LANOXIN ) 0.25 MG tablet Take 1 tablet (250 mcg total) by mouth daily.   [DISCONTINUED] DULoxetine  (CYMBALTA ) 60 MG capsule Take 1 capsule (60 mg total) by mouth daily.   [DISCONTINUED] Evolocumab  (REPATHA  SURECLICK) 140 MG/ML SOAJ Inject 140 mg into the skin every 14 (fourteen) days.   [DISCONTINUED] hydrocortisone  cream 1 % Apply to affected area 2 times daily   [DISCONTINUED] icosapent  Ethyl (VASCEPA ) 1 g capsule TAKE 2 CAPSULES(2 GRAMS) BY MOUTH TWICE DAILY   [DISCONTINUED] Rimegepant Sulfate (NURTEC) 75 MG TBDP Take 1 tablet (75 mg total) by mouth over 48 hr.   [DISCONTINUED] SYMBICORT  160-4.5 MCG/ACT inhaler Inhale 2 puffs into the lungs in the morning and at bedtime.   [DISCONTINUED] tirzepatide  (MOUNJARO ) 5 MG/0.5ML Pen Inject 5 mg into the skin once a week.   [DISCONTINUED] traZODone (DESYREL) 100 MG tablet Take 200 mg by mouth at bedtime.    potassium chloride  (KLOR-CON  M) 10 MEQ  tablet TAKE 1 TABLET BY MOUTH DAILY WITH FUROSEMIDE  TO PREVENT POTASSIUM DEPLETION (Patient not taking: Reported on 09/26/2024)   No facility-administered medications prior to visit.    Review of Systems  Constitutional: Negative.   HENT: Negative.    Eyes: Negative.   Respiratory: Negative.  Negative for shortness of breath.   Cardiovascular: Negative.  Negative for chest pain.  Gastrointestinal: Negative.  Negative for abdominal pain, constipation and diarrhea.  Genitourinary: Negative.   Musculoskeletal:  Negative for joint pain and myalgias.  Skin: Negative.   Neurological: Negative.  Negative for dizziness and headaches.  Endo/Heme/Allergies: Negative.   All other systems reviewed and are negative.      Objective:   BP 122/84   Pulse 93   Ht 5' 8 (1.727 m)   Wt 226 lb (102.5 kg)   SpO2 97%   BMI 34.36 kg/m   Vitals:   09/26/24 0930  BP: 122/84  Pulse: 93  Height: 5' 8 (1.727 m)  Weight: 226 lb (102.5 kg)  SpO2: 97%  BMI (Calculated): 34.37    Physical Exam Vitals and nursing note reviewed.  Constitutional:      Appearance: Normal appearance. She is normal weight.  HENT:     Head: Normocephalic and atraumatic.     Nose: Nose normal.     Mouth/Throat:     Mouth: Mucous membranes are moist.  Eyes:     Extraocular Movements: Extraocular movements intact.     Conjunctiva/sclera: Conjunctivae normal.     Pupils: Pupils are equal, round, and reactive to light.  Cardiovascular:     Rate and Rhythm: Normal rate and regular rhythm.     Pulses: Normal pulses.     Heart sounds: Normal heart sounds.  Pulmonary:     Effort: Pulmonary effort is normal.     Breath sounds: Normal breath sounds.  Abdominal:     General: Abdomen is flat. Bowel sounds are normal.     Palpations: Abdomen is soft.  Musculoskeletal:  General: Normal range of motion.     Cervical back: Normal range of motion.  Skin:    General: Skin is warm and dry.  Neurological:      General: No focal deficit present.     Mental Status: She is alert and oriented to person, place, and time.  Psychiatric:        Mood and Affect: Mood normal.        Behavior: Behavior normal.        Thought Content: Thought content normal.        Judgment: Judgment normal.      Results for orders placed or performed in visit on 09/26/24  POCT Urine Albumin/Creatinine with ratio [[id number]]  Result Value Ref Range   Microalbumin Ur, POC 10 mg/L   Creatinine, POC 100 mg/dL   Albumin/Creatinine Ratio, Urine, POC <30     Recent Results (from the past 2160 hours)  Surgical pathology     Status: None   Collection Time: 08/04/24 12:00 AM  Result Value Ref Range   SURGICAL PATHOLOGY      SURGICAL PATHOLOGY [facility] [address], [address] [place], [place] [address] Telephone [phone] or [phone] Fax [phone]  REPORT OF SURGICAL PATHOLOGY   Accession #: [id number] Patient Name: TANISHKA, DROLET Visit # : [id number]  MRN: [id number] Physician: Jinny Carmine DOB/Age Aug 08, 1971 (Age: 53) Gender: F Collected Date: 08/04/2024 Received Date: 08/04/2024  FINAL DIAGNOSIS       1. Cecum Polyp, cold snare :       - TUBULAR ADENOMA.       2. Transverse Colon Polyp, cold snare :       - TUBULAR ADENOMA.       ELECTRONIC SIGNATURE : Coronel Md, Misti, Sports administrator, International aid/development worker  MICROSCOPIC DESCRIPTION  CASE COMMENTS STAINS USED IN DIAGNOSIS: H&E H&E    CLINICAL HISTORY  SPECIMEN(S) OBTAINED 1. Cecum Polyp, Cold Snare 2. Transverse Colon Polyp, Cold Snare  SPECIMEN COMMENTS: SPECIMEN CLINICAL INFORMATION: 1. Screening colonoscopy, positive cologuard.Colon polyps    Gross Description 1. Receiv ed in formalin is a tan, soft tissue fragment that is submitted in toto.Size:  0.3 cm, 1 block submitted. 2. Received in formalin is a tan, soft tissue fragment that is submitted in toto.Size:  0.7 cm, 1 block submitted.mb  08-04-24        Report signed out from the following location(s) [place] [address] [place], [place] [address] CLIA #: [id number]  [facility] [address] [place], [place] [address] CLIA #: [id number]   Glucose, capillary     Status: Abnormal   Collection Time: 08/04/24  7:58 AM  Result Value Ref Range   Glucose-Capillary 143 (H) 70 - 99 mg/dL    Comment: Glucose reference range applies only to samples taken after fasting for at least 8 hours.  TSH     Status: None   Collection Time: 09/22/24  9:42 AM  Result Value Ref Range   TSH 0.701 0.450 - 4.500 uIU/mL  CMP14+EGFR     Status: Abnormal   Collection Time: 09/22/24  9:42 AM  Result Value Ref Range   Glucose 195 (H) 70 - 99 mg/dL   BUN 8 6 - 24 mg/dL   Creatinine, Ser 9.19 0.57 - 1.00 mg/dL   eGFR 88 >40 fO/fpw/8.26   BUN/Creatinine Ratio 10 9 - 23   Sodium 140 134 - 144 mmol/L   Potassium 4.1 3.5 -  5.2 mmol/L   Chloride 105 96 - 106 mmol/L   CO2 23 20 - 29 mmol/L   Calcium  8.9 8.7 - 10.2 mg/dL   Total Protein 6.0 6.0 - 8.5 g/dL   Albumin 3.8 3.8 - 4.9 g/dL   Globulin, Total 2.2 1.5 - 4.5 g/dL   Bilirubin Total 0.5 0.0 - 1.2 mg/dL   Alkaline Phosphatase 52 49 - 135 IU/L    Comment:               **Please note reference interval change**   AST 24 0 - 40 IU/L   ALT 37 (H) 0 - 32 IU/L  Lipid Profile     Status: Abnormal   Collection Time: 09/22/24  9:42 AM  Result Value Ref Range   Cholesterol, Total 206 (H) 100 - 199 mg/dL   Triglycerides 801 (H) 0 - 149 mg/dL   HDL 35 (L) >60 mg/dL   VLDL Cholesterol Cal 36 5 - 40 mg/dL   LDL Chol Calc (NIH) 864 (H) 0 - 99 mg/dL   Chol/HDL Ratio 5.9 (H) 0.0 - 4.4 ratio    Comment:                                   T. Chol/HDL Ratio                                             Men  Women                               1/2 Avg.Risk  3.4    3.3                                   Avg.Risk  5.0    4.4                                2X  Avg.Risk  9.6    7.1                                3X Avg.Risk 23.4   11.0   Hemoglobin A1c     Status: Abnormal   Collection Time: 09/22/24  9:42 AM  Result Value Ref Range   Hgb A1c MFr Bld 6.5 (H) 4.8 - 5.6 %    Comment:          Prediabetes: 5.7 - 6.4          Diabetes: >6.4          Glycemic control for adults with diabetes: <7.0    Est. average glucose Bld gHb Est-mCnc 140 mg/dL  POCT Urine Albumin/Creatinine with ratio [[id number]]     Status: Normal   Collection Time: 09/26/24 10:04 AM  Result Value Ref Range   Microalbumin Ur, POC 10 mg/L   Creatinine, POC 100 mg/dL   Albumin/Creatinine Ratio, Urine, POC <30       Assessment & Plan:  Referral sent to plastic surgery Increase Mounjaro  to 7.5 mg weekly  Problem List Items Addressed This Visit  Cardiovascular and Mediastinum   Multiple subsegmental pulmonary emboli without acute cor pulmonale (HCC)   Relevant Medications   icosapent  Ethyl (VASCEPA ) 1 g capsule   Evolocumab  (REPATHA  SURECLICK) 140 MG/ML SOAJ   digoxin  (LANOXIN ) 0.25 MG tablet   Inappropriate sinus tachycardia   Relevant Medications   icosapent  Ethyl (VASCEPA ) 1 g capsule   Evolocumab  (REPATHA  SURECLICK) 140 MG/ML SOAJ   digoxin  (LANOXIN ) 0.25 MG tablet     Respiratory   Chronic respiratory failure with hypoxia (HCC)   Relevant Medications   icosapent  Ethyl (VASCEPA ) 1 g capsule   Evolocumab  (REPATHA  SURECLICK) 140 MG/ML SOAJ   digoxin  (LANOXIN ) 0.25 MG tablet   OSA (obstructive sleep apnea)   Relevant Medications   icosapent  Ethyl (VASCEPA ) 1 g capsule   Evolocumab  (REPATHA  SURECLICK) 140 MG/ML SOAJ   digoxin  (LANOXIN ) 0.25 MG tablet     Endocrine   Type 2 diabetes mellitus without complication, with long-term current use of insulin  (HCC)   Relevant Medications   Rimegepant Sulfate (NURTEC) 75 MG TBDP   tirzepatide  (MOUNJARO ) 7.5 MG/0.5ML Pen   Other Relevant Orders   POCT Urine Albumin/Creatinine with ratio [[id number]] (Completed)      Other   Mixed hyperlipidemia   Relevant Medications   icosapent  Ethyl (VASCEPA ) 1 g capsule   Evolocumab  (REPATHA  SURECLICK) 140 MG/ML SOAJ   digoxin  (LANOXIN ) 0.25 MG tablet   Diastolic dysfunction   Relevant Medications   icosapent  Ethyl (VASCEPA ) 1 g capsule   Evolocumab  (REPATHA  SURECLICK) 140 MG/ML SOAJ   digoxin  (LANOXIN ) 0.25 MG tablet   Class 1 obesity due to excess calories with serious comorbidity and body mass index (BMI) of 34.0 to 34.9 in adult - Primary   Relevant Medications   tirzepatide  (MOUNJARO ) 7.5 MG/0.5ML Pen   Other Relevant Orders   Ambulatory referral to Plastic Surgery   Other Visit Diagnoses       Essential hypertension       change to losartan  25   Relevant Medications   icosapent  Ethyl (VASCEPA ) 1 g capsule   Evolocumab  (REPATHA  SURECLICK) 140 MG/ML SOAJ   digoxin  (LANOXIN ) 0.25 MG tablet     Other chest pain       chest pain is getting worse, as cannot go to mail box and get chest pain, advise echo, stress test   Relevant Medications   icosapent  Ethyl (VASCEPA ) 1 g capsule   Evolocumab  (REPATHA  SURECLICK) 140 MG/ML SOAJ   digoxin  (LANOXIN ) 0.25 MG tablet     Screening mammogram, encounter for       Relevant Medications   icosapent  Ethyl (VASCEPA ) 1 g capsule   Evolocumab  (REPATHA  SURECLICK) 140 MG/ML SOAJ   digoxin  (LANOXIN ) 0.25 MG tablet     Syncope, unspecified syncope type       Relevant Medications   icosapent  Ethyl (VASCEPA ) 1 g capsule   Evolocumab  (REPATHA  SURECLICK) 140 MG/ML SOAJ   digoxin  (LANOXIN ) 0.25 MG tablet       Return in about 4 months (around 01/26/2025) for fasitng lab work prior.   Total time spent: 25 minutes  [employer], NP  09/26/2024   This document may have been prepared by Dragon Voice Recognition software and as such may include unintentional dictation errors.

## 2024-09-27 ENCOUNTER — Ambulatory Visit: Admitting: Cardiology

## 2024-10-06 ENCOUNTER — Other Ambulatory Visit: Payer: Self-pay

## 2024-10-06 ENCOUNTER — Encounter: Payer: Self-pay | Admitting: Cardiology

## 2024-10-06 ENCOUNTER — Ambulatory Visit (INDEPENDENT_AMBULATORY_CARE_PROVIDER_SITE_OTHER): Admitting: Cardiology

## 2024-10-06 VITALS — BP 120/79 | HR 114 | Ht 68.0 in | Wt 225.0 lb

## 2024-10-06 DIAGNOSIS — Z0130 Encounter for examination of blood pressure without abnormal findings: Secondary | ICD-10-CM

## 2024-10-06 DIAGNOSIS — J0140 Acute pansinusitis, unspecified: Secondary | ICD-10-CM | POA: Insufficient documentation

## 2024-10-06 DIAGNOSIS — E119 Type 2 diabetes mellitus without complications: Secondary | ICD-10-CM

## 2024-10-06 MED ORDER — AZITHROMYCIN 250 MG PO TABS
ORAL_TABLET | ORAL | 0 refills | Status: AC
Start: 2024-10-06 — End: 2024-10-11

## 2024-10-06 MED ORDER — METHYLPREDNISOLONE 4 MG PO TBPK: ORAL_TABLET | ORAL | 0 refills | Status: DC

## 2024-10-06 MED ORDER — FLUTICASONE PROPIONATE 50 MCG/ACT NA SUSP: 1.0000 | Freq: Two times a day (BID) | NASAL | 3 refills | Status: AC

## 2024-10-06 MED ORDER — AZITHROMYCIN 250 MG PO TABS: ORAL_TABLET | ORAL | 0 refills | Status: DC

## 2024-10-06 NOTE — Progress Notes (Signed)
 Established Patient Office Visit  Subjective:  Patient ID: Crystal Gibson, female    DOB: 10-31-1971  Age: 53 y.o. MRN: [id number]  Chief Complaint  Patient presents with   Acute Visit    Symptoms started Monday night- chest and nose congestion, earpain, cough, fever, bloody nasal secretion.     Patient in office for an acute visit, complaining of chest and nose congestion, ear pain, cough, fever. Symptoms started Monday night. Patient reports fever of 104. Complains of sinus and ear pain, itchy throat, diarrhea. Will send in a Z-pack, Medrol  dose pack. Flonase  refilled. Recommend Mucinex, drink plenty of water and rest.    URI  This is a new problem. The current episode started in the past 7 days. The problem has been unchanged. The maximum temperature recorded prior to her arrival was 103 - 104 F. Associated symptoms include congestion, coughing, diarrhea, ear pain, rhinorrhea, sinus pain, sneezing and a sore throat. Pertinent negatives include no abdominal pain, chest pain, headaches or joint pain. She has tried acetaminophen  and NSAIDs (Tylenol  cold and flu, Robittsun, Alkaselzer cold and flu) for the symptoms. The treatment provided no relief.    No other concerns at this time.   Past Medical History:  Diagnosis Date   Anxiety    Asthma    Bacterial conjunctivitis of right eye 03/16/2023   COVID 2021   Hospitalized with long Covid   Depression    Environmental allergies    Gestational diabetes    Pt is a type 2 diabetes   History of blood clots    when hospitalized for Salmonella poisoning and had Covid at the same time   Lumbar sprain 11/03/2018   Pericarditis 2021   while in hospital for Salmonella poisoning   Sesamoiditis of foot 11/05/2020   Sleep apnea    does not use a cpap    Past Surgical History:  Procedure Laterality Date   ABDOMINAL HYSTERECTOMY     COLONOSCOPY N/A 08/04/2024   Procedure: COLONOSCOPY;  Surgeon: Jinny Carmine, MD;  Location: [facility] ENDOSCOPY;   Service: Endoscopy;  Laterality: N/A;   NO PAST SURGERIES     bunionectomy   OOPHORECTOMY     POLYPECTOMY  08/04/2024   Procedure: POLYPECTOMY, INTESTINE;  Surgeon: Jinny Carmine, MD;  Location: [facility] ENDOSCOPY;  Service: Endoscopy;;   WISDOM TOOTH EXTRACTION      Social History   Socioeconomic History   Marital status: Single    Spouse name: Not on file   Number of children: Not on file   Years of education: Not on file   Highest education level: Not on file  Occupational History   Not on file  Tobacco Use   Smoking status: Never   Smokeless tobacco: Never  Vaping Use   Vaping status: Never Used  Substance and Sexual Activity   Alcohol use: No   Drug use: No   Sexual activity: Yes    Birth control/protection: Surgical  Other Topics Concern   Not on file  Social History Narrative   Not on file   Social Drivers of Health   Financial Resource Strain: High Risk (08/06/2022)   Overall Financial Resource Strain (CARDIA)    Difficulty of Paying Living Expenses: Hard  Food Insecurity: Food Insecurity Present (08/06/2022)   Hunger Vital Sign    Worried About Running Out of Food in the Last Year: Sometimes true    Ran Out of Food in the Last Year: Sometimes true  Transportation Needs: No Transportation Needs (  10/13/2022)   PRAPARE - Administrator, Civil Service (Medical): No    Lack of Transportation (Non-Medical): No  Physical Activity: Inactive (09/08/2022)   Exercise Vital Sign    Days of Exercise per Week: 0 days    Minutes of Exercise per Session: 0 min  Stress: Stress Concern Present (06/24/2022)   [employer] of Occupational Health - Occupational Stress Questionnaire    Feeling of Stress : To some extent  Social Connections: Socially Isolated (07/30/2022)   Social Connection and Isolation Panel    Frequency of Communication with Friends and Family: More than three times a week    Frequency of Social Gatherings with Friends and Family: More than three times  a week    Attends Religious Services: Never    Database administrator or Organizations: No    Attends Banker Meetings: Never    Marital Status: Never married  Intimate Partner Violence: Not At Risk (10/13/2022)   Humiliation, Afraid, Rape, and Kick questionnaire    Fear of Current or Ex-Partner: No    Emotionally Abused: No    Physically Abused: No    Sexually Abused: No    Family History  Problem Relation Age of Onset   Breast cancer Sister 26   Breast cancer Maternal Aunt    Breast cancer Paternal Aunt    Breast cancer Maternal Grandmother    Breast cancer Cousin     Allergies  Allergen Reactions   Glucophage [Metformin] Nausea And Vomiting    Outpatient Medications Prior to Visit  Medication Sig   ACCU-CHEK FASTCLIX LANCETS MISC    ADVAIR  HFA 115-21 MCG/ACT inhaler SMARTSIG:2 Puff(s) By Mouth Twice Daily   ARIPiprazole  (ABILIFY ) 15 MG tablet Take 1 tablet (15 mg total) by mouth daily.   baclofen (LIORESAL) 10 MG tablet Take 10 mg by mouth 3 (three) times daily.   BD PEN NEEDLE NANO 2ND GEN 32G X 4 MM MISC USE THREE TIMES DAILY BEFORE MEALS   Blood Glucose Monitoring Suppl (ACCU-CHEK GUIDE) w/Device KIT USE DEVICE TO CHECK SUGARS DAILY   Butalbital -APAP-Caffeine  50-300-40 MG CAPS Take 1 capsule by mouth every 6 (six) hours as needed (persistent headache).   Continuous Glucose Receiver (DEXCOM G7 RECEIVER) DEVI Use with sensor to check continuous glucose   Continuous Glucose Sensor (DEXCOM G7 SENSOR) MISC Apply to skin for continuous glucose monitoring   dapagliflozin  propanediol (FARXIGA ) 10 MG TABS tablet TAKE 1 TABLET BY MOUTH DAILY FOR DIABETES   digoxin  (LANOXIN ) 0.25 MG tablet Take 1 tablet (250 mcg total) by mouth daily.   DULoxetine  (CYMBALTA ) 60 MG capsule Take 1 capsule (60 mg total) by mouth daily.   EPINEPHrine  0.3 mg/0.3 mL IJ SOAJ injection INJECT 1 PEN IN THE MUSCLE AS NEEDED FOR ANAPHYLAXIS   Evolocumab  (REPATHA  SURECLICK) 140 MG/ML SOAJ Inject  140 mg into the skin every 14 (fourteen) days.   famotidine  (PEPCID ) 20 MG tablet Take 1 tablet (20 mg total) by mouth daily.   furosemide  (LASIX ) 20 MG tablet TAKE 1 TABLET(20 MG) BY MOUTH DAILY   gabapentin  (NEURONTIN ) 300 MG capsule Take 1 capsule (300 mg total) by mouth 3 (three) times daily.   glucose blood (ACCU-CHEK GUIDE) test strip Accu-Chek Guide In Vitro Strip QTY: 200 strip Days: 90 Refills: 1  Written: 04/27/20 Patient Instructions: TEST FASTING BLOOD SUGAR EVERY MORNING FASTING AND EVERY EVENING E11.65   hydrocortisone  cream 1 % Apply to affected area 2 times daily   icosapent  Ethyl (VASCEPA ) 1  g capsule Take 2 capsules (2 g total) by mouth 2 (two) times daily.   Insulin  Pen Needle (GLOBAL EASY GLIDE PEN NEEDLES) 32G X 4 MM MISC Inject 4 Needles into the skin in the morning, at noon, in the evening, and at bedtime.   ivabradine  (CORLANOR) 7.5 MG TABS tablet TAKE 1 TABLET(7.5 MG) BY MOUTH TWICE DAILY   Lancets Misc. (ACCU-CHEK FASTCLIX LANCET) KIT Accu-Chek Fastclix Lancet Drum  USE TO CHECK FASTING IN AM AND AS NEEDED FOR HIGH OR LOW   losartan  (COZAAR ) 25 MG tablet TAKE 1 TABLET(25 MG) BY MOUTH DAILY   metoprolol  succinate (TOPROL -XL) 100 MG 24 hr tablet Take 2 tablets (200 mg total) by mouth daily.   NOVOLOG  FLEXPEN 100 UNIT/ML FlexPen Inject 10 Units into the skin 3 (three) times daily with meals.   nystatin powder Apply 1 Application topically 3 (three) times daily.   olmesartan  (BENICAR ) 5 MG tablet TAKE 1 TABLET(5 MG) BY MOUTH DAILY   OXYGEN Inhale 2 L into the lungs as needed (exertion).   pantoprazole  (PROTONIX ) 40 MG tablet Take 1 tablet (40 mg total) by mouth 2 (two) times daily.   potassium chloride  (KLOR-CON  M) 10 MEQ tablet TAKE 1 TABLET BY MOUTH DAILY WITH FUROSEMIDE  TO PREVENT POTASSIUM DEPLETION   Rimegepant Sulfate (NURTEC) 75 MG TBDP Take 1 tablet (75 mg total) by mouth over 48 hr.   rosuvastatin  (CRESTOR ) 40 MG tablet TAKE 1 TABLET(40 MG) BY MOUTH DAILY    sucralfate  (CARAFATE ) 1 g tablet Take 1 tablet (1 g total) by mouth 4 (four) times daily.   SYMBICORT  160-4.5 MCG/ACT inhaler Inhale 2 puffs into the lungs in the morning and at bedtime.   tirzepatide  (MOUNJARO ) 7.5 MG/0.5ML Pen Inject 7.5 mg into the skin once a week.   topiramate  (TOPAMAX ) 50 MG tablet Take 1 tablet (50 mg total) by mouth at bedtime.   traZODone (DESYREL) 100 MG tablet Take 2 tablets (200 mg total) by mouth at bedtime.   TRESIBA  FLEXTOUCH 200 UNIT/ML FlexTouch Pen ADMINISTER 48 UNITS UNDER THE SKIN DAILY   [DISCONTINUED] fluticasone  (FLONASE ) 50 MCG/ACT nasal spray Place 1 spray into both nostrils 2 (two) times daily.   No facility-administered medications prior to visit.    Review of Systems  Constitutional: Negative.   HENT:  Positive for congestion, ear pain, nosebleeds, rhinorrhea, sinus pain, sneezing and sore throat.   Eyes: Negative.   Respiratory:  Positive for cough and sputum production. Negative for shortness of breath.   Cardiovascular: Negative.  Negative for chest pain.  Gastrointestinal:  Positive for diarrhea. Negative for abdominal pain and constipation.  Genitourinary: Negative.   Musculoskeletal:  Negative for joint pain and myalgias.  Skin: Negative.   Neurological: Negative.  Negative for dizziness and headaches.  Endo/Heme/Allergies: Negative.   All other systems reviewed and are negative.      Objective:   BP 120/79   Pulse (!) 114   Ht 5' 8 (1.727 m)   Wt 225 lb (102.1 kg)   SpO2 97%   BMI 34.21 kg/m   Vitals:   10/06/24 1051  BP: 120/79  Pulse: (!) 114  Height: 5' 8 (1.727 m)  Weight: 225 lb (102.1 kg)  SpO2: 97%  BMI (Calculated): 34.22    Physical Exam Vitals and nursing note reviewed.  Constitutional:      Appearance: Normal appearance. She is normal weight.  HENT:     Head: Normocephalic and atraumatic.     Nose: Nose normal.  Mouth/Throat:     Mouth: Mucous membranes are moist.  Eyes:     Extraocular  Movements: Extraocular movements intact.     Conjunctiva/sclera: Conjunctivae normal.     Pupils: Pupils are equal, round, and reactive to light.  Cardiovascular:     Rate and Rhythm: Normal rate and regular rhythm.     Pulses: Normal pulses.     Heart sounds: Normal heart sounds.  Pulmonary:     Effort: Pulmonary effort is normal.     Breath sounds: Normal breath sounds. No stridor. No wheezing, rhonchi or rales.  Abdominal:     General: Abdomen is flat. Bowel sounds are normal.     Palpations: Abdomen is soft.  Musculoskeletal:        General: Normal range of motion.     Cervical back: Normal range of motion.  Skin:    General: Skin is warm and dry.  Neurological:     General: No focal deficit present.     Mental Status: She is alert and oriented to person, place, and time.  Psychiatric:        Mood and Affect: Mood normal.        Behavior: Behavior normal.        Thought Content: Thought content normal.        Judgment: Judgment normal.      No results found for any visits on 10/06/24.  Recent Results (from the past 2160 hours)  Surgical pathology     Status: None   Collection Time: 08/04/24 12:00 AM  Result Value Ref Range   SURGICAL PATHOLOGY      SURGICAL PATHOLOGY [facility] [address], [address] [place], [place] [address] Telephone [phone] or [phone] Fax [phone]  REPORT OF SURGICAL PATHOLOGY   Accession #: [id number] Patient Name: VERTIS, BAUDER Visit # : [id number]  MRN: [id number] Physician: Jinny Carmine DOB/Age [birth year] (Age: 55) Gender: F Collected Date: 08/04/2024 Received Date: 08/04/2024  FINAL DIAGNOSIS       1. Cecum Polyp, cold snare :       - TUBULAR ADENOMA.       2. Transverse Colon Polyp, cold snare :       - TUBULAR ADENOMA.       ELECTRONIC SIGNATURE : Coronel Md, Misti, Sports administrator, International aid/development worker  MICROSCOPIC DESCRIPTION  CASE COMMENTS STAINS USED IN  DIAGNOSIS: H&E H&E    CLINICAL HISTORY  SPECIMEN(S) OBTAINED 1. Cecum Polyp, Cold Snare 2. Transverse Colon Polyp, Cold Snare  SPECIMEN COMMENTS: SPECIMEN CLINICAL INFORMATION: 1. Screening colonoscopy, positive cologuard.Colon polyps    Gross Description 1. Receiv ed in formalin is a tan, soft tissue fragment that is submitted in toto.Size:  0.3 cm, 1 block submitted. 2. Received in formalin is a tan, soft tissue fragment that is submitted in toto.Size:  0.7 cm, 1 block submitted.mb 08-04-24        Report signed out from the following location(s) [facility] [address] [place], [place] [address] CLIA #: [id number]  [facility] [address] [place], [place] [address] CLIA #: [id number]   Glucose, capillary     Status: Abnormal   Collection Time: 08/04/24  7:58 AM  Result Value Ref Range   Glucose-Capillary 143 (H) 70 - 99 mg/dL    Comment: Glucose reference range applies only to samples taken after fasting for at least 8 hours.  TSH     Status: None   Collection Time:  09/22/24  9:42 AM  Result Value Ref Range   TSH 0.701 0.450 - 4.500 uIU/mL  CMP14+EGFR     Status: Abnormal   Collection Time: 09/22/24  9:42 AM  Result Value Ref Range   Glucose 195 (H) 70 - 99 mg/dL   BUN 8 6 - 24 mg/dL   Creatinine, Ser 9.19 0.57 - 1.00 mg/dL   eGFR 88 >40 fO/fpw/8.26   BUN/Creatinine Ratio 10 9 - 23   Sodium 140 134 - 144 mmol/L   Potassium 4.1 3.5 - 5.2 mmol/L   Chloride 105 96 - 106 mmol/L   CO2 23 20 - 29 mmol/L   Calcium  8.9 8.7 - 10.2 mg/dL   Total Protein 6.0 6.0 - 8.5 g/dL   Albumin 3.8 3.8 - 4.9 g/dL   Globulin, Total 2.2 1.5 - 4.5 g/dL   Bilirubin Total 0.5 0.0 - 1.2 mg/dL   Alkaline Phosphatase 52 49 - 135 IU/L    Comment:               **Please note reference interval change**   AST 24 0 - 40 IU/L   ALT 37 (H) 0 - 32 IU/L  Lipid Profile     Status: Abnormal   Collection Time: 09/22/24  9:42 AM  Result Value Ref  Range   Cholesterol, Total 206 (H) 100 - 199 mg/dL   Triglycerides 801 (H) 0 - 149 mg/dL   HDL 35 (L) >60 mg/dL   VLDL Cholesterol Cal 36 5 - 40 mg/dL   LDL Chol Calc (NIH) 864 (H) 0 - 99 mg/dL   Chol/HDL Ratio 5.9 (H) 0.0 - 4.4 ratio    Comment:                                   T. Chol/HDL Ratio                                             Men  Women                               1/2 Avg.Risk  3.4    3.3                                   Avg.Risk  5.0    4.4                                2X Avg.Risk  9.6    7.1                                3X Avg.Risk 23.4   11.0   Hemoglobin A1c     Status: Abnormal   Collection Time: 09/22/24  9:42 AM  Result Value Ref Range   Hgb A1c MFr Bld 6.5 (H) 4.8 - 5.6 %    Comment:          Prediabetes: 5.7 - 6.4          Diabetes: >6.4          Glycemic control  for adults with diabetes: <7.0    Est. average glucose Bld gHb Est-mCnc 140 mg/dL  POCT Urine Albumin/Creatinine with ratio [[id number]]     Status: Normal   Collection Time: 09/26/24 10:04 AM  Result Value Ref Range   Microalbumin Ur, POC 10 mg/L   Creatinine, POC 100 mg/dL   Albumin/Creatinine Ratio, Urine, POC <30       Assessment & Plan:  Z-pack Medrol  dose pack Flonase  Mucinex Drink plenty of water Rest  Problem List Items Addressed This Visit       Respiratory   Acute non-recurrent pansinusitis - Primary   Relevant Medications   azithromycin  (ZITHROMAX ) 250 MG tablet   methylPREDNISolone  (MEDROL  DOSEPAK) 4 MG TBPK tablet   fluticasone  (FLONASE ) 50 MCG/ACT nasal spray    Return if symptoms worsen or fail to improve, for as scheduled.   Total time spent: 25 minutes  [employer], NP  10/06/2024   This document may have been prepared by Dragon Voice Recognition software and as such may include unintentional dictation errors.

## 2024-10-08 ENCOUNTER — Other Ambulatory Visit: Payer: Self-pay | Admitting: Cardiology

## 2024-10-17 ENCOUNTER — Ambulatory Visit: Admitting: Cardiovascular Disease

## 2024-10-20 ENCOUNTER — Encounter: Payer: Self-pay | Admitting: Cardiovascular Disease

## 2024-10-20 ENCOUNTER — Ambulatory Visit: Admitting: Cardiovascular Disease

## 2024-10-20 ENCOUNTER — Other Ambulatory Visit: Payer: Self-pay

## 2024-10-20 VITALS — BP 122/88 | HR 101 | Ht 68.0 in | Wt 222.8 lb

## 2024-10-20 DIAGNOSIS — J9611 Chronic respiratory failure with hypoxia: Secondary | ICD-10-CM | POA: Diagnosis not present

## 2024-10-20 DIAGNOSIS — G4733 Obstructive sleep apnea (adult) (pediatric): Secondary | ICD-10-CM | POA: Diagnosis not present

## 2024-10-20 DIAGNOSIS — Z1231 Encounter for screening mammogram for malignant neoplasm of breast: Secondary | ICD-10-CM

## 2024-10-20 DIAGNOSIS — I2694 Multiple subsegmental pulmonary emboli without acute cor pulmonale: Secondary | ICD-10-CM | POA: Diagnosis not present

## 2024-10-20 DIAGNOSIS — I5189 Other ill-defined heart diseases: Secondary | ICD-10-CM

## 2024-10-20 DIAGNOSIS — I4711 Inappropriate sinus tachycardia, so stated: Secondary | ICD-10-CM

## 2024-10-20 DIAGNOSIS — Z131 Encounter for screening for diabetes mellitus: Secondary | ICD-10-CM

## 2024-10-20 DIAGNOSIS — R0789 Other chest pain: Secondary | ICD-10-CM | POA: Diagnosis not present

## 2024-10-20 DIAGNOSIS — I10 Essential (primary) hypertension: Secondary | ICD-10-CM

## 2024-10-20 DIAGNOSIS — E782 Mixed hyperlipidemia: Secondary | ICD-10-CM | POA: Diagnosis not present

## 2024-10-20 DIAGNOSIS — R55 Syncope and collapse: Secondary | ICD-10-CM

## 2024-10-20 MED ORDER — EPINEPHRINE 0.3 MG/0.3ML IJ SOAJ: 0.3000 mg | INTRAMUSCULAR | 2 refills | Status: AC | PRN

## 2024-10-20 MED ORDER — FUROSEMIDE 20 MG PO TABS: 40.0000 mg | ORAL_TABLET | Freq: Every day | ORAL | 1 refills | Status: AC

## 2024-10-20 MED ORDER — DEXCOM G7 SENSOR MISC: 11 refills | Status: AC

## 2024-10-20 NOTE — Assessment & Plan Note (Signed)
 Heart rate is reasonable being on Corlanor 7.5 mg.  Twice daily

## 2024-10-20 NOTE — Progress Notes (Signed)
 Cardiology Office Note   Date:  10/20/2024   ID:  Crystal Gibson, DOB 14-Mar-1971, MRN [id number]  PCP:  Carin Gauze, NP  Cardiologist:  Denyse Bathe, MD      History of Present Illness: Crystal Gibson is a 53 y.o. female who presents for  Chief Complaint  Patient presents with   Follow-up    2 month follow up   Patient developed shortness of breath 2 weeks ago with congestion and swelling of the legs. HPI    Past Medical History:  Diagnosis Date   Anxiety    Asthma    Bacterial conjunctivitis of right eye 03/16/2023   COVID 2021   Hospitalized with long Covid   Depression    Environmental allergies    Gestational diabetes    Pt is a type 2 diabetes   History of blood clots    when hospitalized for Salmonella poisoning and had Covid at the same time   Lumbar sprain 11/03/2018   Pericarditis 2021   while in hospital for Salmonella poisoning   Sesamoiditis of foot 11/05/2020   Sleep apnea    does not use a cpap     Past Surgical History:  Procedure Laterality Date   ABDOMINAL HYSTERECTOMY     COLONOSCOPY N/A 08/04/2024   Procedure: COLONOSCOPY;  Surgeon: Jinny Carmine, MD;  Location: [facility] ENDOSCOPY;  Service: Endoscopy;  Laterality: N/A;   NO PAST SURGERIES     bunionectomy   OOPHORECTOMY     POLYPECTOMY  08/04/2024   Procedure: POLYPECTOMY, INTESTINE;  Surgeon: Jinny Carmine, MD;  Location: [facility] ENDOSCOPY;  Service: Endoscopy;;   WISDOM TOOTH EXTRACTION       Current Outpatient Medications  Medication Sig Dispense Refill   ACCU-CHEK FASTCLIX LANCETS MISC   2   ADVAIR  HFA 115-21 MCG/ACT inhaler SMARTSIG:2 Puff(s) By Mouth Twice Daily     ARIPiprazole  (ABILIFY ) 15 MG tablet Take 1 tablet (15 mg total) by mouth daily. 30 tablet 7   BD PEN NEEDLE NANO 2ND GEN 32G X 4 MM MISC USE THREE TIMES DAILY BEFORE MEALS 100 each 3   Blood Glucose Monitoring Suppl (ACCU-CHEK GUIDE) w/Device KIT USE DEVICE TO CHECK SUGARS DAILY     Butalbital -APAP-Caffeine  50-300-40 MG  CAPS Take 1 capsule by mouth every 6 (six) hours as needed (persistent headache). 120 capsule 1   Continuous Glucose Receiver (DEXCOM G7 RECEIVER) DEVI USE WITH SENSORS 1 each 0   Continuous Glucose Sensor (DEXCOM G7 SENSOR) MISC Apply to skin for continuous glucose monitoring 2 each 11   dapagliflozin  propanediol (FARXIGA ) 10 MG TABS tablet TAKE 1 TABLET BY MOUTH DAILY FOR DIABETES 90 tablet 0   digoxin  (LANOXIN ) 0.25 MG tablet Take 1 tablet (250 mcg total) by mouth daily. 90 tablet 1   DULoxetine  (CYMBALTA ) 60 MG capsule Take 1 capsule (60 mg total) by mouth daily. 90 capsule 3   Evolocumab  (REPATHA  SURECLICK) 140 MG/ML SOAJ Inject 140 mg into the skin every 14 (fourteen) days. 2 mL 6   famotidine  (PEPCID ) 20 MG tablet Take 1 tablet (20 mg total) by mouth daily. 90 tablet 1   fluticasone  (FLONASE ) 50 MCG/ACT nasal spray Place 1 spray into both nostrils 2 (two) times daily. 16 g 3   gabapentin  (NEURONTIN ) 300 MG capsule Take 1 capsule (300 mg total) by mouth 3 (three) times daily. 90 capsule 3   glucose blood (ACCU-CHEK GUIDE) test strip Accu-Chek Guide In Vitro Strip QTY: 200 strip Days: 90 Refills: 1  Written: 04/27/20 Patient Instructions: TEST FASTING BLOOD SUGAR EVERY MORNING FASTING AND EVERY EVENING E11.65     hydrocortisone  cream 1 % Apply to affected area 2 times daily 30 g 1   icosapent  Ethyl (VASCEPA ) 1 g capsule Take 2 capsules (2 g total) by mouth 2 (two) times daily. 360 capsule 1   Insulin  Pen Needle (GLOBAL EASY GLIDE PEN NEEDLES) 32G X 4 MM MISC Inject 4 Needles into the skin in the morning, at noon, in the evening, and at bedtime. 100 each 5   ivabradine  (CORLANOR) 7.5 MG TABS tablet TAKE 1 TABLET(7.5 MG) BY MOUTH TWICE DAILY 180 tablet 0   Lancets Misc. (ACCU-CHEK FASTCLIX LANCET) KIT Accu-Chek Fastclix Lancet Drum  USE TO CHECK FASTING IN AM AND AS NEEDED FOR HIGH OR LOW     losartan  (COZAAR ) 25 MG tablet TAKE 1 TABLET(25 MG) BY MOUTH DAILY 90 tablet 1   methylPREDNISolone   (MEDROL  DOSEPAK) 4 MG TBPK tablet As directed 1 each 0   metoprolol  succinate (TOPROL -XL) 100 MG 24 hr tablet Take 2 tablets (200 mg total) by mouth daily. 90 tablet 1   NOVOLOG  FLEXPEN 100 UNIT/ML FlexPen Inject 10 Units into the skin 3 (three) times daily with meals. 15 mL 11   nystatin powder Apply 1 Application topically 3 (three) times daily. 15 g 0   olmesartan  (BENICAR ) 5 MG tablet TAKE 1 TABLET(5 MG) BY MOUTH DAILY 90 tablet 3   OXYGEN Inhale 2 L into the lungs as needed (exertion).     pantoprazole  (PROTONIX ) 40 MG tablet Take 1 tablet (40 mg total) by mouth 2 (two) times daily. 180 tablet 3   potassium chloride  (KLOR-CON  M) 10 MEQ tablet TAKE 1 TABLET BY MOUTH DAILY WITH FUROSEMIDE  TO PREVENT POTASSIUM DEPLETION 30 tablet 11   Rimegepant Sulfate (NURTEC) 75 MG TBDP Take 1 tablet (75 mg total) by mouth over 48 hr. 12 tablet 3   rosuvastatin  (CRESTOR ) 40 MG tablet TAKE 1 TABLET(40 MG) BY MOUTH DAILY 90 tablet 1   sucralfate  (CARAFATE ) 1 g tablet Take 1 tablet (1 g total) by mouth 4 (four) times daily. 120 tablet 1   SYMBICORT  160-4.5 MCG/ACT inhaler Inhale 2 puffs into the lungs in the morning and at bedtime. 1 each 11   tirzepatide  (MOUNJARO ) 7.5 MG/0.5ML Pen Inject 7.5 mg into the skin once a week. 2 mL 4   topiramate  (TOPAMAX ) 50 MG tablet Take 1 tablet (50 mg total) by mouth at bedtime. 30 tablet 6   traZODone (DESYREL) 100 MG tablet Take 2 tablets (200 mg total) by mouth at bedtime. 180 tablet 1   TRESIBA  FLEXTOUCH 200 UNIT/ML FlexTouch Pen ADMINISTER 48 UNITS UNDER THE SKIN DAILY 9 mL 3   baclofen (LIORESAL) 10 MG tablet Take 10 mg by mouth 3 (three) times daily.     EPINEPHrine  0.3 mg/0.3 mL IJ SOAJ injection INJECT 1 PEN IN THE MUSCLE AS NEEDED FOR ANAPHYLAXIS 2 each 2   furosemide  (LASIX ) 20 MG tablet Take 2 tablets (40 mg total) by mouth daily. 90 tablet 1   No current facility-administered medications for this visit.    Allergies:   Glucophage [metformin]    Social  History:   reports that she has never smoked. She has never used smokeless tobacco. She reports that she does not drink alcohol and does not use drugs.   Family History:  family history includes Breast cancer in her cousin, maternal aunt, maternal grandmother, and paternal aunt; Breast cancer (age of onset: 50)  in her sister.    ROS:     Review of Systems  Constitutional: Negative.   HENT: Negative.    Eyes: Negative.   Respiratory: Negative.    Gastrointestinal: Negative.   Genitourinary: Negative.   Musculoskeletal: Negative.   Skin: Negative.   Neurological: Negative.   Endo/Heme/Allergies: Negative.   Psychiatric/Behavioral: Negative.    All other systems reviewed and are negative.     All other systems are reviewed and negative.    PHYSICAL EXAM: VS:  BP 122/88   Pulse (!) 101   Ht 5' 8 (1.727 m)   Wt 222 lb 12.8 oz (101.1 kg)   SpO2 97%   BMI 33.88 kg/m  , BMI Body mass index is 33.88 kg/m. Last weight:  Wt Readings from Last 3 Encounters:  10/20/24 222 lb 12.8 oz (101.1 kg)  10/06/24 225 lb (102.1 kg)  09/26/24 226 lb (102.5 kg)     Physical Exam Constitutional:      Appearance: Normal appearance.  Cardiovascular:     Rate and Rhythm: Normal rate and regular rhythm.     Heart sounds: Normal heart sounds.  Pulmonary:     Effort: Pulmonary effort is normal.     Breath sounds: Normal breath sounds.  Musculoskeletal:     Right lower leg: No edema.     Left lower leg: No edema.  Neurological:     Mental Status: She is alert.       EKG:   Recent Labs: 09/22/2024: ALT 37; BUN 8; Creatinine, Ser 0.80; Potassium 4.1; Sodium 140; TSH 0.701    Lipid Panel    Component Value Date/Time   CHOL 206 (H) 09/22/2024 0942   TRIG 198 (H) 09/22/2024 0942   HDL 35 (L) 09/22/2024 0942   CHOLHDL 5.9 (H) 09/22/2024 0942   LDLCALC 135 (H) 09/22/2024 9057      Other studies Reviewed: Additional studies/ records that were reviewed today include:  Review of  the above records demonstrates:       No data to display            ASSESSMENT AND PLAN:    ICD-10-CM   1. Chest pain, non-cardiac  R07.89     2. Mixed hyperlipidemia  E78.2 furosemide  (LASIX ) 20 MG tablet    3. Multiple subsegmental pulmonary emboli without acute cor pulmonale (HCC)  I26.94 furosemide  (LASIX ) 20 MG tablet    4. Inappropriate sinus tachycardia  I47.11 furosemide  (LASIX ) 20 MG tablet   palpitation less frequent    5. Chronic respiratory failure with hypoxia (HCC)  J96.11 furosemide  (LASIX ) 20 MG tablet    6. OSA (obstructive sleep apnea)  G47.33 furosemide  (LASIX ) 20 MG tablet    7. Diastolic dysfunction  I51.89 furosemide  (LASIX ) 20 MG tablet    8. Essential hypertension  I10    change to losartan  25    9. Other chest pain  R07.89    chest pain is getting worse, as cannot go to mail box and get chest pain, advise echo, stress test    10. Screening mammogram, encounter for  Z12.31     11. Syncope, unspecified syncope type  R55        Problem List Items Addressed This Visit       Cardiovascular and Mediastinum   Multiple subsegmental pulmonary emboli without acute cor pulmonale (HCC)   Relevant Medications   furosemide  (LASIX ) 20 MG tablet   Inappropriate sinus tachycardia   Heart rate is reasonable being on Corlanor  7.5 mg.  Twice daily      Relevant Medications   furosemide  (LASIX ) 20 MG tablet     Respiratory   Chronic respiratory failure with hypoxia (HCC)   Relevant Medications   furosemide  (LASIX ) 20 MG tablet   OSA (obstructive sleep apnea)   Relevant Medications   furosemide  (LASIX ) 20 MG tablet     Other   Mixed hyperlipidemia   Relevant Medications   furosemide  (LASIX ) 20 MG tablet   Diastolic dysfunction   Relevant Medications   furosemide  (LASIX ) 20 MG tablet   Other Visit Diagnoses       Chest pain, non-cardiac    -  Primary     Essential hypertension       change to losartan  25   Relevant Medications    furosemide  (LASIX ) 20 MG tablet     Other chest pain       chest pain is getting worse, as cannot go to mail box and get chest pain, advise echo, stress test     Screening mammogram, encounter for         Syncope, unspecified syncope type              Disposition:   Return in about 2 months (around 12/20/2024).    Total time spent: 30 minutes  Signed,  Denyse Bathe, MD  10/20/2024 1:58 PM    [facility]

## 2024-10-21 ENCOUNTER — Other Ambulatory Visit: Payer: Self-pay | Admitting: Cardiology

## 2024-11-06 ENCOUNTER — Other Ambulatory Visit: Payer: Self-pay | Admitting: Cardiology

## 2024-11-06 DIAGNOSIS — I5189 Other ill-defined heart diseases: Secondary | ICD-10-CM

## 2024-11-06 DIAGNOSIS — J9611 Chronic respiratory failure with hypoxia: Secondary | ICD-10-CM

## 2024-11-06 DIAGNOSIS — G4733 Obstructive sleep apnea (adult) (pediatric): Secondary | ICD-10-CM

## 2024-11-06 DIAGNOSIS — E782 Mixed hyperlipidemia: Secondary | ICD-10-CM

## 2024-11-06 DIAGNOSIS — R55 Syncope and collapse: Secondary | ICD-10-CM

## 2024-11-06 DIAGNOSIS — I4711 Inappropriate sinus tachycardia, so stated: Secondary | ICD-10-CM

## 2024-11-06 DIAGNOSIS — Z1231 Encounter for screening mammogram for malignant neoplasm of breast: Secondary | ICD-10-CM

## 2024-11-06 DIAGNOSIS — I2694 Multiple subsegmental pulmonary emboli without acute cor pulmonale: Secondary | ICD-10-CM

## 2024-11-06 DIAGNOSIS — I10 Essential (primary) hypertension: Secondary | ICD-10-CM

## 2024-11-06 DIAGNOSIS — R0789 Other chest pain: Secondary | ICD-10-CM

## 2024-11-11 ENCOUNTER — Other Ambulatory Visit: Payer: Self-pay | Admitting: Cardiology

## 2024-11-22 ENCOUNTER — Telehealth: Payer: Self-pay

## 2024-11-22 NOTE — Telephone Encounter (Signed)
 Pleas call the patient and let her know that the PA for the Repatha  is still denied , the [employer]  is not requiring one and I do not have one for her Aspart Insulin 

## 2024-11-23 ENCOUNTER — Telehealth: Payer: Self-pay

## 2024-11-23 NOTE — Telephone Encounter (Signed)
 [facility] faxed over a PA request and wants to follow up that we received it.

## 2024-11-23 NOTE — Telephone Encounter (Signed)
 Left vm to return call.

## 2024-11-29 DIAGNOSIS — L987 Excessive and redundant skin and subcutaneous tissue: Secondary | ICD-10-CM | POA: Diagnosis not present

## 2024-12-13 ENCOUNTER — Ambulatory Visit: Admitting: Cardiology

## 2024-12-14 ENCOUNTER — Ambulatory Visit (INDEPENDENT_AMBULATORY_CARE_PROVIDER_SITE_OTHER): Admitting: Cardiology

## 2024-12-14 ENCOUNTER — Encounter: Payer: Self-pay | Admitting: Cardiology

## 2024-12-14 VITALS — BP 122/78 | HR 95 | Ht 68.0 in | Wt 226.4 lb

## 2024-12-14 DIAGNOSIS — E1169 Type 2 diabetes mellitus with other specified complication: Secondary | ICD-10-CM | POA: Diagnosis not present

## 2024-12-14 DIAGNOSIS — E782 Mixed hyperlipidemia: Secondary | ICD-10-CM | POA: Diagnosis not present

## 2024-12-14 DIAGNOSIS — J9611 Chronic respiratory failure with hypoxia: Secondary | ICD-10-CM

## 2024-12-14 DIAGNOSIS — R55 Syncope and collapse: Secondary | ICD-10-CM

## 2024-12-14 DIAGNOSIS — G4733 Obstructive sleep apnea (adult) (pediatric): Secondary | ICD-10-CM | POA: Diagnosis not present

## 2024-12-14 DIAGNOSIS — Z1231 Encounter for screening mammogram for malignant neoplasm of breast: Secondary | ICD-10-CM | POA: Diagnosis not present

## 2024-12-14 DIAGNOSIS — I5189 Other ill-defined heart diseases: Secondary | ICD-10-CM | POA: Diagnosis not present

## 2024-12-14 DIAGNOSIS — I4711 Inappropriate sinus tachycardia, so stated: Secondary | ICD-10-CM

## 2024-12-14 DIAGNOSIS — I10 Essential (primary) hypertension: Secondary | ICD-10-CM | POA: Diagnosis not present

## 2024-12-14 DIAGNOSIS — I152 Hypertension secondary to endocrine disorders: Secondary | ICD-10-CM | POA: Diagnosis not present

## 2024-12-14 DIAGNOSIS — E1159 Type 2 diabetes mellitus with other circulatory complications: Secondary | ICD-10-CM | POA: Diagnosis not present

## 2024-12-14 DIAGNOSIS — I2694 Multiple subsegmental pulmonary emboli without acute cor pulmonale: Secondary | ICD-10-CM

## 2024-12-14 DIAGNOSIS — E1165 Type 2 diabetes mellitus with hyperglycemia: Secondary | ICD-10-CM | POA: Diagnosis not present

## 2024-12-14 DIAGNOSIS — R0789 Other chest pain: Secondary | ICD-10-CM

## 2024-12-14 MED ORDER — METHYLPREDNISOLONE 4 MG PO TBPK: ORAL_TABLET | ORAL | 0 refills | Status: AC

## 2024-12-14 MED ORDER — REPATHA SURECLICK 140 MG/ML ~~LOC~~ SOAJ: 140.0000 mg | SUBCUTANEOUS | 6 refills | Status: AC

## 2024-12-14 NOTE — Progress Notes (Signed)
 "  Established Patient Office Visit  Subjective:  Patient ID: Crystal Gibson, female    DOB: 1971-06-03  Age: 53 y.o. MRN: [id number]  Chief Complaint  Patient presents with   Follow-up    2 month follow up. Painful lips    Patient in office for 2 month follow up. Patient complaining of painful lips, started less than a week ago. Patient requesting a steroid cream for lips, will send in clobetasol cream. Will also send in a medrol  dose pack.  Patient requesting a prescription mouthwash, states she has used it in the past. Unable to find in her records, patient to call back with name of mouthwash.  Patient needing clearance for surgery. Awaiting clearance form. Return for fasting lab work.     No other concerns at this time.   Past Medical History:  Diagnosis Date   Anxiety    Asthma    Bacterial conjunctivitis of right eye 03/16/2023   COVID 2021   Hospitalized with long Covid   Depression    Environmental allergies    Gestational diabetes    Pt is a type 2 diabetes   History of blood clots    when hospitalized for Salmonella poisoning and had Covid at the same time   Lumbar sprain 11/03/2018   Pericarditis 2021   while in hospital for Salmonella poisoning   Sesamoiditis of foot 11/05/2020   Sleep apnea    does not use a cpap    Past Surgical History:  Procedure Laterality Date   ABDOMINAL HYSTERECTOMY     COLONOSCOPY N/A 08/04/2024   Procedure: COLONOSCOPY;  Surgeon: Jinny Carmine, MD;  Location: [facility] ENDOSCOPY;  Service: Endoscopy;  Laterality: N/A;   NO PAST SURGERIES     bunionectomy   OOPHORECTOMY     POLYPECTOMY  08/04/2024   Procedure: POLYPECTOMY, INTESTINE;  Surgeon: Jinny Carmine, MD;  Location: [facility] ENDOSCOPY;  Service: Endoscopy;;   WISDOM TOOTH EXTRACTION      Social History   Socioeconomic History   Marital status: Single    Spouse name: Not on file   Number of children: Not on file   Years of education: Not on file   Highest education level: Not on  file  Occupational History   Not on file  Tobacco Use   Smoking status: Never   Smokeless tobacco: Never  Vaping Use   Vaping status: Never Used  Substance and Sexual Activity   Alcohol use: No   Drug use: No   Sexual activity: Yes    Birth control/protection: Surgical  Other Topics Concern   Not on file  Social History Narrative   Not on file   Social Drivers of Health   Tobacco Use: Low Risk (12/14/2024)   Patient History    Smoking Tobacco Use: Never    Smokeless Tobacco Use: Never    Passive Exposure: Not on file  Financial Resource Strain: High Risk (08/06/2022)   Overall Financial Resource Strain (CARDIA)    Difficulty of Paying Living Expenses: Hard  Food Insecurity: Food Insecurity Present (08/06/2022)   Hunger Vital Sign    Worried About Running Out of Food in the Last Year: Sometimes true    Ran Out of Food in the Last Year: Sometimes true  Transportation Needs: No Transportation Needs (10/13/2022)   PRAPARE - Administrator, Civil Service (Medical): No    Lack of Transportation (Non-Medical): No  Physical Activity: Inactive (09/08/2022)   Exercise Vital Sign    Days  of Exercise per Week: 0 days    Minutes of Exercise per Session: 0 min  Stress: Stress Concern Present (06/24/2022)   [employer] of Occupational Health - Occupational Stress Questionnaire    Feeling of Stress : To some extent  Social Connections: Socially Isolated (07/30/2022)   Social Connection and Isolation Panel    Frequency of Communication with Friends and Family: More than three times a week    Frequency of Social Gatherings with Friends and Family: More than three times a week    Attends Religious Services: Never    Database Administrator or Organizations: No    Attends Banker Meetings: Never    Marital Status: Never married  Intimate Partner Violence: Not At Risk (10/13/2022)   Humiliation, Afraid, Rape, and Kick questionnaire    Fear of Current or  Ex-Partner: No    Emotionally Abused: No    Physically Abused: No    Sexually Abused: No  Depression (PHQ2-9): Not on file  Alcohol Screen: Low Risk (03/21/2022)   Alcohol Screen    Last Alcohol Screening Score (AUDIT): 0  Housing: Medium Risk (06/24/2022)   Housing    Last Housing Risk Score: 1  Utilities: Not At Risk (09/08/2022)   AHC Utilities    Threatened with loss of utilities: No  Health Literacy: Not on file    Family History  Problem Relation Age of Onset   Breast cancer Sister 54   Breast cancer Maternal Aunt    Breast cancer Paternal Aunt    Breast cancer Maternal Grandmother    Breast cancer Cousin     Allergies[1]  Show/hide medication list[2]  Review of Systems  Constitutional: Negative.   HENT: Negative.    Eyes: Negative.   Respiratory: Negative.  Negative for shortness of breath.   Cardiovascular: Negative.  Negative for chest pain.  Gastrointestinal: Negative.  Negative for abdominal pain, constipation and diarrhea.  Genitourinary: Negative.   Musculoskeletal:  Negative for joint pain and myalgias.  Skin: Negative.   Neurological: Negative.  Negative for dizziness and headaches.  Endo/Heme/Allergies: Negative.   All other systems reviewed and are negative.      Objective:   BP 122/78   Pulse 95   Ht 5' 8 (1.727 m)   Wt 226 lb 6.4 oz (102.7 kg)   SpO2 98%   BMI 34.42 kg/m   Vitals:   12/14/24 0902  BP: 122/78  Pulse: 95  Height: 5' 8 (1.727 m)  Weight: 226 lb 6.4 oz (102.7 kg)  SpO2: 98%  BMI (Calculated): 34.43    Physical Exam Vitals and nursing note reviewed.  Constitutional:      Appearance: Normal appearance. She is normal weight.  HENT:     Head: Normocephalic and atraumatic.     Nose: Nose normal.     Mouth/Throat:     Mouth: Mucous membranes are moist.  Eyes:     Extraocular Movements: Extraocular movements intact.     Conjunctiva/sclera: Conjunctivae normal.     Pupils: Pupils are equal, round, and reactive to  light.  Cardiovascular:     Rate and Rhythm: Normal rate and regular rhythm.     Pulses: Normal pulses.     Heart sounds: Normal heart sounds.  Pulmonary:     Effort: Pulmonary effort is normal.     Breath sounds: Normal breath sounds.  Abdominal:     General: Abdomen is flat. Bowel sounds are normal.     Palpations: Abdomen is soft.  Musculoskeletal:        General: Normal range of motion.     Cervical back: Normal range of motion.  Skin:    General: Skin is warm and dry.  Neurological:     General: No focal deficit present.     Mental Status: She is alert and oriented to person, place, and time.  Psychiatric:        Mood and Affect: Mood normal.        Behavior: Behavior normal.        Thought Content: Thought content normal.        Judgment: Judgment normal.      No results found for any visits on 12/14/24.  Recent Results (from the past 2160 hours)  TSH     Status: None   Collection Time: 09/22/24  9:42 AM  Result Value Ref Range   TSH 0.701 0.450 - 4.500 uIU/mL  CMP14+EGFR     Status: Abnormal   Collection Time: 09/22/24  9:42 AM  Result Value Ref Range   Glucose 195 (H) 70 - 99 mg/dL   BUN 8 6 - 24 mg/dL   Creatinine, Ser 9.19 0.57 - 1.00 mg/dL   eGFR 88 >40 fO/fpw/8.26   BUN/Creatinine Ratio 10 9 - 23   Sodium 140 134 - 144 mmol/L   Potassium 4.1 3.5 - 5.2 mmol/L   Chloride 105 96 - 106 mmol/L   CO2 23 20 - 29 mmol/L   Calcium  8.9 8.7 - 10.2 mg/dL   Total Protein 6.0 6.0 - 8.5 g/dL   Albumin 3.8 3.8 - 4.9 g/dL   Globulin, Total 2.2 1.5 - 4.5 g/dL   Bilirubin Total 0.5 0.0 - 1.2 mg/dL   Alkaline Phosphatase 52 49 - 135 IU/L    Comment:               **Please note reference interval change**   AST 24 0 - 40 IU/L   ALT 37 (H) 0 - 32 IU/L  Lipid Profile     Status: Abnormal   Collection Time: 09/22/24  9:42 AM  Result Value Ref Range   Cholesterol, Total 206 (H) 100 - 199 mg/dL   Triglycerides 801 (H) 0 - 149 mg/dL   HDL 35 (L) >60 mg/dL   VLDL  Cholesterol Cal 36 5 - 40 mg/dL   LDL Chol Calc (NIH) 864 (H) 0 - 99 mg/dL   Chol/HDL Ratio 5.9 (H) 0.0 - 4.4 ratio    Comment:                                   T. Chol/HDL Ratio                                             Men  Women                               1/2 Avg.Risk  3.4    3.3                                   Avg.Risk  5.0    4.4  2X Avg.Risk  9.6    7.1                                3X Avg.Risk 23.4   11.0   Hemoglobin A1c     Status: Abnormal   Collection Time: 09/22/24  9:42 AM  Result Value Ref Range   Hgb A1c MFr Bld 6.5 (H) 4.8 - 5.6 %    Comment:          Prediabetes: 5.7 - 6.4          Diabetes: >6.4          Glycemic control for adults with diabetes: <7.0    Est. average glucose Bld gHb Est-mCnc 140 mg/dL  POCT Urine Albumin/Creatinine with ratio [[id number]]     Status: Normal   Collection Time: 09/26/24 10:04 AM  Result Value Ref Range   Microalbumin Ur, POC 10 mg/L   Creatinine, POC 100 mg/dL   Albumin/Creatinine Ratio, Urine, POC <30       Assessment & Plan:  Clobetasol Medrol  dose pack Return for fasting lab work  Problem List Items Addressed This Visit       Cardiovascular and Mediastinum   Multiple subsegmental pulmonary emboli without acute cor pulmonale (HCC)   Relevant Medications   Evolocumab  (REPATHA  SURECLICK) 140 MG/ML SOAJ   Inappropriate sinus tachycardia   Relevant Medications   Evolocumab  (REPATHA  SURECLICK) 140 MG/ML SOAJ     Respiratory   Chronic respiratory failure with hypoxia (HCC)   Relevant Medications   Evolocumab  (REPATHA  SURECLICK) 140 MG/ML SOAJ   OSA (obstructive sleep apnea)   Relevant Medications   Evolocumab  (REPATHA  SURECLICK) 140 MG/ML SOAJ     Other   Mixed hyperlipidemia   Relevant Medications   Evolocumab  (REPATHA  SURECLICK) 140 MG/ML SOAJ   Diastolic dysfunction   Relevant Medications   Evolocumab  (REPATHA  SURECLICK) 140 MG/ML SOAJ   Other Visit Diagnoses        Essential hypertension       change to losartan  25   Relevant Medications   Evolocumab  (REPATHA  SURECLICK) 140 MG/ML SOAJ     Other chest pain       chest pain is getting worse, as cannot go to mail box and get chest pain, advise echo, stress test   Relevant Medications   Evolocumab  (REPATHA  SURECLICK) 140 MG/ML SOAJ     Screening mammogram, encounter for       Relevant Medications   Evolocumab  (REPATHA  SURECLICK) 140 MG/ML SOAJ     Syncope, unspecified syncope type       Relevant Medications   Evolocumab  (REPATHA  SURECLICK) 140 MG/ML SOAJ       Return in about 4 months (around 04/14/2025) for fasting lab work prior.   Total time spent: 25 minutes. This time includes review of previous notes and results and patient face to face interaction during today's visit.    Jeoffrey Pollen, NP  12/14/2024   This document may have been prepared by Dragon Voice Recognition software and as such may include unintentional dictation errors.      [1]  Allergies Allergen Reactions   Glucophage [Metformin] Nausea And Vomiting  [2]  Outpatient Medications Prior to Visit  Medication Sig   ACCU-CHEK FASTCLIX LANCETS MISC    ADVAIR  HFA 115-21 MCG/ACT inhaler SMARTSIG:2 Puff(s) By Mouth Twice Daily   ARIPiprazole  (ABILIFY ) 15 MG tablet Take 1 tablet (15 mg total) by mouth daily.  baclofen (LIORESAL) 10 MG tablet Take 10 mg by mouth 3 (three) times daily.   BD PEN NEEDLE NANO 2ND GEN 32G X 4 MM MISC USE THREE TIMES DAILY BEFORE MEALS   Blood Glucose Monitoring Suppl (ACCU-CHEK GUIDE) w/Device KIT USE DEVICE TO CHECK SUGARS DAILY   Butalbital -APAP-Caffeine  50-300-40 MG CAPS Take 1 capsule by mouth every 6 (six) hours as needed (persistent headache).   Continuous Glucose Receiver (DEXCOM G7 RECEIVER) DEVI USE WITH SENSORS   Continuous Glucose Sensor (DEXCOM G7 SENSOR) MISC Apply to skin for continuous glucose monitoring   digoxin  (LANOXIN ) 0.25 MG tablet Take 1 tablet (250 mcg total) by mouth  daily.   DULoxetine  (CYMBALTA ) 60 MG capsule Take 1 capsule (60 mg total) by mouth daily.   EPINEPHrine  0.3 mg/0.3 mL IJ SOAJ injection Inject 0.3 mg into the muscle as needed for anaphylaxis.   famotidine  (PEPCID ) 20 MG tablet Take 1 tablet (20 mg total) by mouth daily.   FARXIGA  10 MG TABS tablet TAKE 1 TABLET BY MOUTH DAILY FOR DIABETES   fluticasone  (FLONASE ) 50 MCG/ACT nasal spray Place 1 spray into both nostrils 2 (two) times daily.   furosemide  (LASIX ) 20 MG tablet Take 2 tablets (40 mg total) by mouth daily.   gabapentin  (NEURONTIN ) 300 MG capsule Take 1 capsule (300 mg total) by mouth 3 (three) times daily.   glucose blood (ACCU-CHEK GUIDE) test strip Accu-Chek Guide In Vitro Strip QTY: 200 strip Days: 90 Refills: 1  Written: 04/27/20 Patient Instructions: TEST FASTING BLOOD SUGAR EVERY MORNING FASTING AND EVERY EVENING E11.65   hydrocortisone  cream 1 % Apply to affected area 2 times daily   icosapent  Ethyl (VASCEPA ) 1 g capsule Take 2 capsules (2 g total) by mouth 2 (two) times daily.   Insulin  Pen Needle (GLOBAL EASY GLIDE PEN NEEDLES) 32G X 4 MM MISC Inject 4 Needles into the skin in the morning, at noon, in the evening, and at bedtime.   ivabradine  (CORLANOR) 7.5 MG TABS tablet TAKE 1 TABLET(7.5 MG) BY MOUTH TWICE DAILY   Lancets Misc. (ACCU-CHEK FASTCLIX LANCET) KIT Accu-Chek Fastclix Lancet Drum  USE TO CHECK FASTING IN AM AND AS NEEDED FOR HIGH OR LOW   losartan  (COZAAR ) 25 MG tablet TAKE 1 TABLET(25 MG) BY MOUTH DAILY   metoprolol  succinate (TOPROL -XL) 100 MG 24 hr tablet Take 2 tablets (200 mg total) by mouth daily.   NOVOLOG  FLEXPEN 100 UNIT/ML FlexPen Inject 10 Units into the skin 3 (three) times daily with meals.   nystatin  powder Apply 1 Application topically 3 (three) times daily.   olmesartan  (BENICAR ) 5 MG tablet TAKE 1 TABLET(5 MG) BY MOUTH DAILY   OXYGEN Inhale 2 L into the lungs as needed (exertion).   pantoprazole  (PROTONIX ) 40 MG tablet Take 1 tablet (40 mg total)  by mouth 2 (two) times daily.   potassium chloride  (KLOR-CON  M) 10 MEQ tablet TAKE 1 TABLET BY MOUTH DAILY WITH FUROSEMIDE  TO PREVENT POTASSIUM DEPLETION   Rimegepant Sulfate (NURTEC) 75 MG TBDP Take 1 tablet (75 mg total) by mouth over 48 hr.   rosuvastatin  (CRESTOR ) 40 MG tablet TAKE 1 TABLET(40 MG) BY MOUTH DAILY   sucralfate  (CARAFATE ) 1 g tablet Take 1 tablet (1 g total) by mouth 4 (four) times daily.   SYMBICORT  160-4.5 MCG/ACT inhaler Inhale 2 puffs into the lungs in the morning and at bedtime.   topiramate  (TOPAMAX ) 50 MG tablet TAKE 1 TABLET(50 MG) BY MOUTH AT BEDTIME   traZODone  (DESYREL ) 100 MG tablet Take 2  tablets (200 mg total) by mouth at bedtime.   TRESIBA  FLEXTOUCH 200 UNIT/ML FlexTouch Pen ADMINISTER 48 UNITS UNDER THE SKIN DAILY   [DISCONTINUED] methylPREDNISolone  (MEDROL  DOSEPAK) 4 MG TBPK tablet As directed   [DISCONTINUED] REPATHA  SURECLICK 140 MG/ML SOAJ ADMINISTER 1 ML UNDER THE SKIN EVERY 14 DAYS   [DISCONTINUED] tirzepatide  (MOUNJARO ) 7.5 MG/0.5ML Pen Inject 7.5 mg into the skin once a week.   No facility-administered medications prior to visit.   "

## 2024-12-16 ENCOUNTER — Other Ambulatory Visit: Payer: Self-pay

## 2024-12-16 DIAGNOSIS — E119 Type 2 diabetes mellitus without complications: Secondary | ICD-10-CM

## 2024-12-16 MED ORDER — NURTEC 75 MG PO TBDP: ORAL_TABLET | ORAL | 3 refills | Status: DC

## 2024-12-19 ENCOUNTER — Other Ambulatory Visit: Payer: Self-pay | Admitting: Cardiology

## 2024-12-19 ENCOUNTER — Telehealth: Payer: Self-pay

## 2024-12-19 ENCOUNTER — Other Ambulatory Visit: Payer: Self-pay

## 2024-12-19 DIAGNOSIS — M793 Panniculitis, unspecified: Secondary | ICD-10-CM | POA: Diagnosis not present

## 2024-12-19 DIAGNOSIS — E119 Type 2 diabetes mellitus without complications: Secondary | ICD-10-CM

## 2024-12-19 MED ORDER — NURTEC 75 MG PO TBDP: ORAL_TABLET | ORAL | 3 refills | Status: AC

## 2024-12-19 NOTE — Telephone Encounter (Signed)
 Left a VM

## 2024-12-19 NOTE — Telephone Encounter (Signed)
 Pharmacy needs to know how many migraines pt has had in order to fill her nurtec.

## 2024-12-19 NOTE — Telephone Encounter (Signed)
 Pt has 11 headches per month, she sent mychart message about this

## 2025-01-05 ENCOUNTER — Other Ambulatory Visit: Payer: Self-pay

## 2025-01-06 MED ORDER — TRAZODONE HCL 100 MG PO TABS: 200.0000 mg | ORAL_TABLET | Freq: Every day | ORAL | 0 refills | Status: AC

## 2025-01-09 DIAGNOSIS — E1165 Type 2 diabetes mellitus with hyperglycemia: Secondary | ICD-10-CM | POA: Insufficient documentation

## 2025-01-09 DIAGNOSIS — Z1231 Encounter for screening mammogram for malignant neoplasm of breast: Secondary | ICD-10-CM | POA: Insufficient documentation

## 2025-01-09 DIAGNOSIS — E1169 Type 2 diabetes mellitus with other specified complication: Secondary | ICD-10-CM | POA: Insufficient documentation

## 2025-01-09 DIAGNOSIS — E1159 Type 2 diabetes mellitus with other circulatory complications: Secondary | ICD-10-CM | POA: Insufficient documentation

## 2025-01-24 ENCOUNTER — Ambulatory Visit: Admitting: Cardiology

## 2025-01-27 ENCOUNTER — Other Ambulatory Visit

## 2025-01-27 ENCOUNTER — Ambulatory Visit: Admitting: Cardiology

## 2025-01-27 DIAGNOSIS — E782 Mixed hyperlipidemia: Secondary | ICD-10-CM

## 2025-01-27 DIAGNOSIS — Z1329 Encounter for screening for other suspected endocrine disorder: Secondary | ICD-10-CM

## 2025-01-27 DIAGNOSIS — E119 Type 2 diabetes mellitus without complications: Secondary | ICD-10-CM

## 2025-01-28 LAB — CMP14+EGFR
ALT: 25 [IU]/L (ref 0–32)
AST: 14 [IU]/L (ref 0–40)
Albumin: 4.2 g/dL (ref 3.8–4.9)
Alkaline Phosphatase: 67 [IU]/L (ref 49–135)
BUN/Creatinine Ratio: 12 (ref 9–23)
BUN: 10 mg/dL (ref 6–24)
Bilirubin Total: 0.3 mg/dL (ref 0.0–1.2)
CO2: 24 mmol/L (ref 20–29)
Calcium: 9.4 mg/dL (ref 8.7–10.2)
Chloride: 98 mmol/L (ref 96–106)
Creatinine, Ser: 0.81 mg/dL (ref 0.57–1.00)
Globulin, Total: 2.8 g/dL (ref 1.5–4.5)
Glucose: 289 mg/dL — ABNORMAL HIGH (ref 70–99)
Potassium: 4.6 mmol/L (ref 3.5–5.2)
Sodium: 137 mmol/L (ref 134–144)
Total Protein: 7 g/dL (ref 6.0–8.5)
eGFR: 87 mL/min/{1.73_m2}

## 2025-01-28 LAB — HEMOGLOBIN A1C
Est. average glucose Bld gHb Est-mCnc: 183 mg/dL
Hgb A1c MFr Bld: 8 % — ABNORMAL HIGH (ref 4.8–5.6)

## 2025-01-28 LAB — LIPID PANEL
Chol/HDL Ratio: 5.6 ratio — ABNORMAL HIGH (ref 0.0–4.4)
Cholesterol, Total: 247 mg/dL — ABNORMAL HIGH (ref 100–199)
HDL: 44 mg/dL
LDL Chol Calc (NIH): 169 mg/dL — ABNORMAL HIGH (ref 0–99)
Triglycerides: 186 mg/dL — ABNORMAL HIGH (ref 0–149)
VLDL Cholesterol Cal: 34 mg/dL (ref 5–40)

## 2025-01-28 LAB — TSH: TSH: 0.761 u[IU]/mL (ref 0.450–4.500)

## 2025-01-31 ENCOUNTER — Ambulatory Visit: Payer: Self-pay | Admitting: Cardiology

## 2025-02-08 ENCOUNTER — Ambulatory Visit: Admitting: Cardiology

## 2025-04-14 ENCOUNTER — Ambulatory Visit: Admitting: Cardiology
